# Patient Record
Sex: Male | Born: 1955 | Race: Black or African American | Hispanic: No | Marital: Married | Smoking: Never smoker
Health system: Southern US, Community
[De-identification: ages and names within clinical notes are randomized; demographics above are authoritative.]

## PROBLEM LIST (undated history)

## (undated) DIAGNOSIS — E785 Hyperlipidemia, unspecified: Secondary | ICD-10-CM

## (undated) DIAGNOSIS — E119 Type 2 diabetes mellitus without complications: Secondary | ICD-10-CM

## (undated) DIAGNOSIS — I10 Essential (primary) hypertension: Secondary | ICD-10-CM

## (undated) HISTORY — DX: Essential (primary) hypertension: I10

## (undated) HISTORY — PX: ANKLE SURGERY: SHX546

## (undated) HISTORY — DX: Hyperlipidemia, unspecified: E78.5

## (undated) HISTORY — PX: OTHER SURGICAL HISTORY: SHX169

---

## 2000-07-07 ENCOUNTER — Encounter: Payer: Self-pay | Admitting: Emergency Medicine

## 2000-07-07 ENCOUNTER — Emergency Department (HOSPITAL_COMMUNITY): Admission: EM | Admit: 2000-07-07 | Discharge: 2000-07-08 | Payer: Self-pay | Admitting: Emergency Medicine

## 2006-03-11 ENCOUNTER — Emergency Department (HOSPITAL_COMMUNITY): Admission: EM | Admit: 2006-03-11 | Discharge: 2006-03-11 | Payer: Self-pay | Admitting: Emergency Medicine

## 2008-01-22 ENCOUNTER — Emergency Department (HOSPITAL_COMMUNITY): Admission: EM | Admit: 2008-01-22 | Discharge: 2008-01-22 | Payer: Self-pay | Admitting: Emergency Medicine

## 2009-06-07 ENCOUNTER — Encounter: Payer: Self-pay | Admitting: Orthopedic Surgery

## 2009-06-14 ENCOUNTER — Ambulatory Visit: Payer: Self-pay | Admitting: Orthopedic Surgery

## 2009-06-14 DIAGNOSIS — M1710 Unilateral primary osteoarthritis, unspecified knee: Secondary | ICD-10-CM

## 2009-06-14 DIAGNOSIS — M25569 Pain in unspecified knee: Secondary | ICD-10-CM

## 2009-09-12 ENCOUNTER — Ambulatory Visit: Payer: Self-pay | Admitting: Orthopedic Surgery

## 2009-09-12 DIAGNOSIS — M6580 Other synovitis and tenosynovitis, unspecified site: Secondary | ICD-10-CM

## 2010-04-24 ENCOUNTER — Ambulatory Visit: Payer: Self-pay | Admitting: Orthopedic Surgery

## 2011-01-29 NOTE — Letter (Signed)
Summary: History form  History form   Imported By: Jacklynn Ganong 07/06/2009 16:18:39  _____________________________________________________________________  External Attachment:    Type:   Image     Comment:   External Document

## 2011-01-29 NOTE — Assessment & Plan Note (Signed)
Summary: 3 m RE-CK LT KNEE/RESP TO MED/BCBS/CAF   Visit Type:  Follow-up Referring Provider:  Dr Margo Common  Primary Provider:  Dr. Margo Common   CC:  recheck left knee.  History of Present Illness: I saw Calvin Shields in the office today for a followup visit.  He is a 55 years old man with the complaint of:  left knee pain follow up   recheck left knee after taking Relafen 750 two times a day for left knee tendinitis.  The knee is pain free  ROS no mechanical symptoms  Normal gait, ROM, Strength, stability and alignment, neuro-vascular intact   Stable   Relafen   f/u as needed       Allergies: No Known Drug Allergies   Impression & Recommendations:  Problem # 1:  TENDINITIS, LEFT KNEE (ICD-727.09) Assessment Improved  Orders: Est. Patient Level III ([address])  Problem # 2:  KNEE, ARTHRITIS, DEGEN./OSTEO (UEA-540.98) Assessment: Improved  Orders: Est. Patient Level III (11914)  Patient Instructions: 1)  Please schedule a follow-up appointment as needed. Prescriptions: NABUMETONE 750 MG TABS (NABUMETONE) 1 by mouth two times a day  #60 x 2   Entered and Authorized by:   Fuller Canada MD   Signed by:   Fuller Canada MD on 04/24/2010   Method used:   Printed then faxed to ...       [facility]* (retail)       [address]       [place], [place]  [address]       Ph: [phone]       Fax: [phone]   RxID:   [id number]

## 2011-09-20 LAB — BASIC METABOLIC PANEL
BUN: 14
CO2: 29
Calcium: 9.2
Chloride: 98
Creatinine, Ser: 1.09
GFR calc Af Amer: >60
GFR calc non Af Amer: >60
Glucose, Bld: 204 — ABNORMAL HIGH
Potassium: 3.5
Sodium: 137

## 2011-09-20 LAB — CBC
HCT: 45.1
Hemoglobin: 15.4
MCHC: 34.2
MCV: 87.8
Platelets: 346
RBC: 5.14
RDW: 12.8
WBC: 11.3 — ABNORMAL HIGH

## 2011-09-20 LAB — DIFFERENTIAL
Basophils Absolute: 0.1
Basophils Relative: 1
Eosinophils Absolute: 0.1
Eosinophils Relative: 1
Lymphocytes Relative: 10 — ABNORMAL LOW
Lymphs Abs: 1.1
Monocytes Absolute: 0.6
Monocytes Relative: 5
Neutro Abs: 9.4 — ABNORMAL HIGH
Neutrophils Relative %: 84 — ABNORMAL HIGH

## 2012-08-08 ENCOUNTER — Other Ambulatory Visit: Payer: Self-pay | Admitting: Family Medicine

## 2012-08-08 NOTE — Telephone Encounter (Signed)
Deny--last ov one year ago

## 2012-08-12 ENCOUNTER — Other Ambulatory Visit: Payer: Self-pay | Admitting: Family Medicine

## 2012-08-13 NOTE — Telephone Encounter (Signed)
Needs office visit.

## 2014-01-21 DIAGNOSIS — M76829 Posterior tibial tendinitis, unspecified leg: Secondary | ICD-10-CM | POA: Insufficient documentation

## 2014-01-21 DIAGNOSIS — Z981 Arthrodesis status: Secondary | ICD-10-CM | POA: Insufficient documentation

## 2014-01-21 DIAGNOSIS — I10 Essential (primary) hypertension: Secondary | ICD-10-CM | POA: Insufficient documentation

## 2014-01-25 DIAGNOSIS — IMO0002 Reserved for concepts with insufficient information to code with codable children: Secondary | ICD-10-CM | POA: Insufficient documentation

## 2014-01-25 DIAGNOSIS — E119 Type 2 diabetes mellitus without complications: Secondary | ICD-10-CM | POA: Insufficient documentation

## 2014-01-26 DIAGNOSIS — J189 Pneumonia, unspecified organism: Secondary | ICD-10-CM | POA: Insufficient documentation

## 2014-01-29 DIAGNOSIS — D500 Iron deficiency anemia secondary to blood loss (chronic): Secondary | ICD-10-CM | POA: Insufficient documentation

## 2014-01-29 DIAGNOSIS — K5790 Diverticulosis of intestine, part unspecified, without perforation or abscess without bleeding: Secondary | ICD-10-CM | POA: Insufficient documentation

## 2014-01-29 DIAGNOSIS — K760 Fatty (change of) liver, not elsewhere classified: Secondary | ICD-10-CM | POA: Insufficient documentation

## 2014-06-27 ENCOUNTER — Emergency Department (HOSPITAL_COMMUNITY)
Admission: EM | Admit: 2014-06-27 | Discharge: 2014-06-27 | Disposition: A | Discharge status: Home / Self Care | Payer: BC Managed Care – PPO | Attending: Emergency Medicine | Admitting: Emergency Medicine

## 2014-06-27 ENCOUNTER — Encounter (HOSPITAL_COMMUNITY): Payer: Self-pay | Admitting: Emergency Medicine

## 2014-06-27 DIAGNOSIS — M25571 Pain in right ankle and joints of right foot: Secondary | ICD-10-CM

## 2014-06-27 DIAGNOSIS — Z79899 Other long term (current) drug therapy: Secondary | ICD-10-CM | POA: Insufficient documentation

## 2014-06-27 DIAGNOSIS — G8911 Acute pain due to trauma: Secondary | ICD-10-CM | POA: Insufficient documentation

## 2014-06-27 DIAGNOSIS — Z792 Long term (current) use of antibiotics: Secondary | ICD-10-CM | POA: Insufficient documentation

## 2014-06-27 DIAGNOSIS — M25579 Pain in unspecified ankle and joints of unspecified foot: Secondary | ICD-10-CM | POA: Insufficient documentation

## 2014-06-27 HISTORY — DX: Type 2 diabetes mellitus without complications: E11.9

## 2014-06-27 MED ORDER — HYDROCODONE-ACETAMINOPHEN 5-325 MG PO TABS: 1.0000 | ORAL_TABLET | ORAL | Status: DC | PRN

## 2014-06-27 MED ORDER — HYDROCODONE-ACETAMINOPHEN 5-325 MG PO TABS
2.0000 | ORAL_TABLET | Freq: Once | ORAL | Status: AC
  Administered 2014-06-27: 2 via ORAL
  Filled 2014-06-27: qty 2

## 2014-06-27 MED ORDER — ONDANSETRON HCL 4 MG PO TABS
4.0000 mg | ORAL_TABLET | Freq: Once | ORAL | Status: AC
  Administered 2014-06-27: 4 mg via ORAL
  Filled 2014-06-27: qty 1

## 2014-06-27 MED ORDER — METHYLPREDNISOLONE SODIUM SUCC 125 MG IJ SOLR
125.0000 mg | Freq: Once | INTRAMUSCULAR | Status: AC
  Administered 2014-06-27: 125 mg via INTRAMUSCULAR
  Filled 2014-06-27: qty 2

## 2014-06-27 NOTE — Discharge Instructions (Signed)
Please apply heating pad to your ankle 10-15 minute intervals. Please keep your ankle elevated as much as possible. Use Tylenol for mild pain, use Norco for more severe pain.Norco may cause drowsiness, use with caution.Please see Dr. Margo Commonapper this week for assistance with your pain management. Your were treated with a shot of solumedrol today.  There is no evidence for major infection or dislocation of your ankle at this time.

## 2014-06-27 NOTE — ED Notes (Signed)
Pt with right ankle pain, denies any injury, hx of surgery to that ankle and has swelling noted but pt states that is not new

## 2014-06-27 NOTE — ED Notes (Signed)
Pt verbalized understanding of no driving and to use caution with taking Vicodin and also to be aware of taking tylenol with it and not to take more than 4000mg  within 24 hour period

## 2014-06-27 NOTE — ED Notes (Signed)
Pt reports to the ED with right leg pain. Pt reports surgery to that leg in January, with a new onset of pain yesterday. Pt denies injury or fall.

## 2014-06-27 NOTE — ED Provider Notes (Signed)
CSN: [id number]     Arrival date & time 06/27/14  1003 History  This chart was scribed for non-physician practitioner Ivery QualeHobson Bryant, PA-C working with Vanetta MuldersScott Zackowski, MD by Calvin Shields, ED scribe. This patient was seen in room APFT21/APFT21 and the patient's care was started at 11:32 AM.   Chief Complaint  Patient presents with  . Leg Pain     (Consider location/radiation/quality/duration/timing/severity/associated sxs/prior Treatment) Patient is a 58 y.o. male presenting with leg pain. The history is provided by the patient. No language interpreter was used.  Leg Pain Location:  Leg Time since incident:  1 day Injury: no   Leg location:  R leg Pain details:    Quality:  Aching   Severity:  Moderate   Onset quality:  Sudden   Duration:  1 day   Timing:  Constant Chronicity: right arch reconstruction 12/2013. Associated symptoms: no decreased ROM and no numbness    HPI Comments: Calvin Shields is a 58 y.o. male who presents to the Emergency Department complaining of sudden onset of aching pain to his right foot, onset yesterday. He states he recently had arch reconstructive surgery done on his right foot 12/2013, done by Dr. Scotty CourtStafford in [place]. He states he had 2-3 screws and plate placed during surgery. He denies any recent falls, injuries to his right foot. He denies any other symptoms. He reports a h/o DM, states his sugar level was 102 this morning.   PCP - Louie BostonAPPER,DAVID B, MD - [place].   Past Medical History  Diagnosis Date  . Diabetes mellitus without complication    Past Surgical History  Procedure Laterality Date  . Right leg surgery      No family history on file. History  Substance Use Topics  . Smoking status: Never Smoker   . Smokeless tobacco: Not on file  . Alcohol Use: No    Review of Systems  All other systems reviewed and are negative.  Allergies  Codeine  Home Medications   Prior to Admission medications   Medication Sig Start Date End Date  Taking? Authorizing Provider  Empagliflozin (JARDIANCE) 25 MG TABS Take 25 mg by mouth daily.   Yes Historical Provider, MD  lisinopril-hydrochlorothiazide (PRINZIDE,ZESTORETIC) 20-25 MG per tablet Take 1 tablet by mouth daily.   Yes Historical Provider, MD  pravastatin (PRAVACHOL) 40 MG tablet Take 40 mg by mouth daily.   Yes Historical Provider, MD  cephALEXin (KEFLEX) 500 MG capsule Take 500 mg by mouth 4 (four) times daily.    Historical Provider, MD   BP 160/79  Pulse 72  Temp(Src) 97.9 F (36.6 C) (Oral)  Resp 20  Ht 5\' 9"  (1.753 m)  Wt 244 lb (110.678 kg)  BMI 36.02 kg/m2  SpO2 97% Physical Exam  Nursing note and vitals reviewed. Constitutional: He is oriented to person, place, and time. He appears well-developed and well-nourished. No distress.  HENT:  Head: Normocephalic and atraumatic.  Eyes: Conjunctivae and EOM are normal.  Neck: Neck supple.  Cardiovascular: Normal rate, regular rhythm, normal heart sounds and intact distal pulses.   No murmur heard. DP 1+ right foot. Cap refill < 2 seconds.  Pulmonary/Chest: Effort normal and breath sounds normal. No respiratory distress. He has no wheezes. He has no rales.  Musculoskeletal: Normal range of motion.       Right ankle: Achilles tendon normal.  Right achilles tendon intact.  Neurological: He is alert and oriented to person, place, and time.  Skin: Skin is warm and dry. No  erythema.  Well healed surgical scars on right ankle lateral and medial malleolus. No puncture wound to plantar surface. No lesions between toes. Minimal warmth of lateral malleolus. No red streaking of LE. Decreased hair growth of lower extremity.  Psychiatric: He has a normal mood and affect. His behavior is normal.    ED Course  Procedures (including critical care time)  DIAGNOSTIC STUDIES: Oxygen Saturation is 97% on room air, normal by my interpretation.    COORDINATION OF CARE: 11:39 AM-Discussed treatment plan which includes Cortisone  injection with pt at bedside and pt agreed to plan. Advised pt to inform PCP of his Solumedrol  injection due to his h/o DM.   Medications  methylPREDNISolone sodium succinate (SOLU-MEDROL) 125 mg/2 mL injection 125 mg (125 mg Intramuscular Given 06/27/14 1152)  HYDROcodone-acetaminophen (NORCO/VICODIN) 5-325 MG per tablet 2 tablet (2 tablets Oral Given 06/27/14 1152)  ondansetron (ZOFRAN) tablet 4 mg (4 mg Oral Given 06/27/14 1152)     EKG Interpretation None      MDM No gross neurologic deficits appreciated on today's examination. There are well-healed surgical scars from previous surgical interventions noted. I suspect the patient has an inflammatory attack related to previous operations and especially with the insertion of hardware in the foot.  The patient will be treated with Norco 5 mg #12 tablets. I have asked patient to see his primary physician for additional assistance with pain management.    Final diagnoses:  None      **I personally performed the services described in this documentation, which was scribed in my presence. The recorded information has been reviewed and is accurate.[name]*   Hobson M Bryant, PA-C 06/27/14 1843

## 2014-06-28 NOTE — ED Provider Notes (Signed)
Medical screening examination/treatment/procedure(s) were performed by non-physician practitioner and as supervising physician I was immediately available for consultation/collaboration.   EKG Interpretation None        Scott Zackowski, MD 06/28/14 0824 

## 2015-03-22 ENCOUNTER — Encounter: Payer: Self-pay | Attending: "Endocrinology | Admitting: Nutrition

## 2015-03-22 ENCOUNTER — Encounter: Payer: Self-pay | Admitting: Nutrition

## 2015-03-22 VITALS — Ht 69.0 in | Wt 231.0 lb

## 2015-03-22 DIAGNOSIS — E669 Obesity, unspecified: Secondary | ICD-10-CM

## 2015-03-22 DIAGNOSIS — IMO0002 Reserved for concepts with insufficient information to code with codable children: Secondary | ICD-10-CM

## 2015-03-22 DIAGNOSIS — E1165 Type 2 diabetes mellitus with hyperglycemia: Secondary | ICD-10-CM

## 2015-03-22 DIAGNOSIS — E118 Type 2 diabetes mellitus with unspecified complications: Secondary | ICD-10-CM

## 2015-03-22 NOTE — Progress Notes (Signed)
  Medical Nutrition Therapy:  Appt start time: 1530 end time:  1630.  Assessment:  Primary concerns today: Diabets. Lives with his wife who does all the shopping and cooking. She is here today with him for his appointment.Most foods are baked and sometimes fried. Eats three meals per day. Works at [employer].  Taking NigerJardiance and VIctoza. Physical activity Three times per [facility] doing water aerobics. Not testing blood sugars due to A1C 6.5%.     Would like to lose 50 lbs in 6 months.  Preferred Learning Style:   Auditory  Visual  Hands on  Learning Readiness:     Ready  Change in progress   MEDICATIONS: See list   DIETARY INTAKE:  24-hr recall:  B ( AM): Oatmeal brown sugar and cinnamon oatmeal with coffee at [employer],  Snk ( AM): none  L ( PM): Side salad, no dressing: Lite [place], water Snk ( PM):  D ( PM): Peas, pork chop, roll, water Snk ( PM):  Beverages: water  Usual physical activity: water aerobics three times per week.  Estimated energy needs: 1800 calories 200 g carbohydrates 135 g protein 50 g fat  Progress Towards Goal(s):  In progress.   Nutritional Diagnosis:  NB-1.1 Food and nutrition-related knowledge deficit As related to Diabetes.  As evidenced by A1C 6.6%..    Intervention:  Nutrition counseling on diabetes, meal planning, My Plate, low sodium, low fat high fiber diet,  target ranges for blood sugars, portion sizes, meal planning and benefits of exercise and prevention of complications of DM.  Goal:  1. Lose  3.5 lbs in 1 month 2. Increase fresh fruits and vegetables. 3. Control carb choices per meal 4. Exercise 30 minutes daily.   Teaching Method Utilized:  Visual Auditory Hands on  Handouts given during visit include: The Plate Method Meal Plan Card   Barriers to learning/adherence to lifestyle change: difficulty walking and possible cognitive issues  Demonstrated degree of understanding via:  Teach Back    Monitoring/Evaluation:  Dietary intake, exercise, meal planning, and body weight in 1 month(s).

## 2015-03-22 NOTE — Patient Instructions (Signed)
Goal:  1. Lose  3.5 lbs in 1 month 2. Increase fresh fruits and vegetables. 3. Control carb choices per meal 4. Exercise 30 minutes daily.

## 2015-05-10 ENCOUNTER — Encounter: Payer: Self-pay | Attending: "Endocrinology | Admitting: Nutrition

## 2015-11-16 ENCOUNTER — Telehealth: Payer: Self-pay

## 2015-11-16 NOTE — Telephone Encounter (Signed)
Insurance approved Victoza which he was previously on before Tanzeum. Can we send a new prescription?

## 2015-11-16 NOTE — Telephone Encounter (Signed)
[facility] for both Tanzeum and Victoza in attempt to get insurance to pay for one of these meds. Pt ran out on Thurs 11-09-15, called the pharmacy on 11-09-15 for a refill, and I sent the PA on Thurs 11-09-15. Pt has called about this 4-5 times daily. I have advised him we are waiting on his insurance to respond. I last spoke with [employer] on 11-15-15 concerning this and they state it could take up to 24 more hours. Pt is not happy with this. States "You will give me an answer today." I advised pt to call and see what they will cover so we can prescribe something different and get it today. Pt refuses. I advised him we had one victoza pen available for him but he does need to find out what will be covered.

## 2015-11-16 NOTE — Telephone Encounter (Signed)
After several attempts to authorize either Tanzeum or Victoza. The pts insurance company has not approved either. I have asked the pt to find out what his insurance covers. He refuses. He left a very loud/demanding voice mail today stating that"We BETTER have an answer today". Unsure of what to do at this point?

## 2015-12-19 ENCOUNTER — Ambulatory Visit: Payer: Self-pay | Admitting: "Endocrinology

## 2016-01-04 LAB — HEMOGLOBIN A1C: Hemoglobin A1C: 6.6

## 2016-01-16 ENCOUNTER — Encounter: Payer: Self-pay | Admitting: "Endocrinology

## 2016-01-16 ENCOUNTER — Ambulatory Visit (INDEPENDENT_AMBULATORY_CARE_PROVIDER_SITE_OTHER): Payer: BLUE CROSS/BLUE SHIELD | Admitting: "Endocrinology

## 2016-01-16 VITALS — Ht 69.0 in

## 2016-01-16 DIAGNOSIS — E1165 Type 2 diabetes mellitus with hyperglycemia: Secondary | ICD-10-CM | POA: Insufficient documentation

## 2016-01-16 DIAGNOSIS — E119 Type 2 diabetes mellitus without complications: Secondary | ICD-10-CM

## 2016-01-16 DIAGNOSIS — IMO0002 Reserved for concepts with insufficient information to code with codable children: Secondary | ICD-10-CM | POA: Insufficient documentation

## 2016-01-16 DIAGNOSIS — E782 Mixed hyperlipidemia: Secondary | ICD-10-CM | POA: Insufficient documentation

## 2016-01-16 DIAGNOSIS — I10 Essential (primary) hypertension: Secondary | ICD-10-CM | POA: Insufficient documentation

## 2016-01-16 DIAGNOSIS — E785 Hyperlipidemia, unspecified: Secondary | ICD-10-CM

## 2016-01-16 NOTE — Telephone Encounter (Signed)
Pt discussed with Dr Fransico Him at [facility] today.

## 2016-01-16 NOTE — Progress Notes (Signed)
Subjective:    Patient ID: Calvin Shields, male    DOB: 1956/03/29, PCP Deloria Lair, MD   Past Medical History  Diagnosis Date  . Diabetes mellitus without complication ([place])   . Hyperlipidemia   . Hypertension    Past Surgical History  Procedure Laterality Date  . Right leg surgery      Social History   Social History  . Marital Status: Married    Spouse Name: N/A  . Number of Children: N/A  . Years of Education: N/A   Social History Main Topics  . Smoking status: Never Smoker   . Smokeless tobacco: None  . Alcohol Use: No  . Drug Use: No  . Sexual Activity: Not Asked   Other Topics Concern  . None   Social History Narrative   Outpatient Encounter Prescriptions as of 01/16/2016  Medication Sig  . allopurinol (ZYLOPRIM) 100 MG tablet Take 100 mg by mouth daily.  . Empagliflozin (JARDIANCE) 25 MG TABS Take 25 mg by mouth daily.  Marland Kitchen ibuprofen (ADVIL,MOTRIN) 800 MG tablet Take 800 mg by mouth every 8 (eight) hours as needed.  . Liraglutide 18 MG/3ML SOPN Inject 1.8 mg into the skin daily.   Marland Kitchen lisinopril-hydrochlorothiazide (PRINZIDE,ZESTORETIC) 20-25 MG per tablet Take 1 tablet by mouth daily.  . pravastatin (PRAVACHOL) 40 MG tablet Take 40 mg by mouth daily.  . traMADol-acetaminophen (ULTRACET) 37.5-325 MG tablet Take 1 tablet by mouth every 6 (six) hours as needed.  . [DISCONTINUED] cephALEXin (KEFLEX) 500 MG capsule Take 500 mg by mouth 4 (four) times daily.  . [DISCONTINUED] HYDROcodone-acetaminophen (NORCO/VICODIN) 5-325 MG per tablet Take 1 tablet by mouth every 4 (four) hours as needed. (Patient not taking: Reported on 03/22/2015)   No facility-administered encounter medications on file as of 01/16/2016.   ALLERGIES: Allergies  Allergen Reactions  . Codeine Nausea And Vomiting   VACCINATION STATUS:  There is no immunization history on file for this patient.  Diabetes He presents for his follow-up diabetic visit. He has type 2 diabetes mellitus. His  disease course has been stable. There are no hypoglycemic associated symptoms. Pertinent negatives for hypoglycemia include no confusion, headaches, pallor or seizures. There are no diabetic associated symptoms. Pertinent negatives for diabetes include no chest pain, no fatigue, no polydipsia, no polyphagia, no polyuria and no weakness. There are no hypoglycemic complications. Symptoms are stable. Diabetic complications include peripheral neuropathy. Risk factors for coronary artery disease include diabetes mellitus, dyslipidemia, hypertension, male sex, obesity and sedentary lifestyle. He is compliant with treatment most of the time. His weight is stable. He is following a generally unhealthy diet. When asked about meal planning, he reported none. He has had a previous visit with a dietitian. He never participates in exercise. An ACE inhibitor/angiotensin II receptor blocker is being taken. Eye exam is current.  Hyperlipidemia This is a chronic problem. The current episode started more than 1 year ago. Exacerbating diseases include diabetes and obesity. Pertinent negatives include no chest pain, myalgias or shortness of breath. Current antihyperlipidemic treatment includes statins. Risk factors for coronary artery disease include diabetes mellitus, dyslipidemia, hypertension, male sex, obesity and a sedentary lifestyle.  Hypertension This is a chronic problem. The current episode started more than 1 year ago. Pertinent negatives include no chest pain, headaches, neck pain, palpitations or shortness of breath. Risk factors for coronary artery disease include dyslipidemia, diabetes mellitus, obesity, male gender and sedentary lifestyle. Past treatments include ACE inhibitors.     Review of Systems  Constitutional:  Negative for fever, chills, fatigue and unexpected weight change.  HENT: Negative for dental problem, mouth sores and trouble swallowing.   Eyes: Negative for visual disturbance.  Respiratory:  Negative for cough, choking, chest tightness, shortness of breath and wheezing.   Cardiovascular: Negative for chest pain, palpitations and leg swelling.  Gastrointestinal: Negative for nausea, vomiting, abdominal pain, diarrhea, constipation and abdominal distention.  Endocrine: Negative for polydipsia, polyphagia and polyuria.  Genitourinary: Negative for dysuria, urgency, hematuria and flank pain.  Musculoskeletal: Negative for myalgias, back pain, gait problem and neck pain.  Skin: Negative for pallor, rash and wound.  Neurological: Negative for seizures, syncope, weakness, numbness and headaches.  Psychiatric/Behavioral: Negative.  Negative for confusion and dysphoric mood.    Objective:    Ht '5\' 9"'$  (1.753 m)  Wt Readings from Last 3 Encounters:  03/22/15 231 lb (104.781 kg)  06/27/14 244 lb (110.678 kg)  06/14/09 240 lb (108.863 kg)    Physical Exam  Constitutional: He is oriented to person, place, and time. He appears well-developed and well-nourished. He is cooperative. No distress.  HENT:  Head: Normocephalic and atraumatic.  Eyes: EOM are normal.  Neck: Normal range of motion. Neck supple. No tracheal deviation present. No thyromegaly present.  Cardiovascular: Normal rate, S1 normal, S2 normal and normal heart sounds.  Exam reveals no gallop.   No murmur heard. Pulses:      Dorsalis pedis pulses are 1+ on the right side, and 1+ on the left side.       Posterior tibial pulses are 1+ on the right side, and 1+ on the left side.  Pulmonary/Chest: Breath sounds normal. No respiratory distress. He has no wheezes.  Abdominal: Soft. Bowel sounds are normal. He exhibits no distension. There is no tenderness. There is no guarding and no CVA tenderness.  Musculoskeletal: He exhibits no edema.       Right shoulder: He exhibits no swelling and no deformity.  Neurological: He is alert and oriented to person, place, and time. He has normal strength and normal reflexes. No cranial nerve  deficit or sensory deficit. Gait normal.  Skin: Skin is warm and dry. No rash noted. No cyanosis. Nails show no clubbing.  Psychiatric: He has a normal mood and affect. His speech is normal and behavior is normal. Judgment and thought content normal. Cognition and memory are normal.     CMP ( most recent) CMP     Component Value Date/Time   NA 137 01/22/2008 0649   K 3.5 01/22/2008 0649   CL 98 01/22/2008 0649   CO2 29 01/22/2008 0649   GLUCOSE 204* 01/22/2008 0649   BUN 14 01/22/2008 0649   CREATININE 1.09 01/22/2008 0649   CALCIUM 9.2 01/22/2008 0649   GFRNONAA >60 01/22/2008 0649   GFRAA  01/22/2008 0649    >60        The eGFR has been calculated using the MDRD equation. This calculation has not been validated in all clinical       Assessment & Plan:   1. Diabetes mellitus without complication (HCC)   --diabetes is  complicated by peripheral neuropathy and patient remains at a high risk for more acute and chronic complications of diabetes which include CAD, CVA, CKD, retinopathy, and neuropathy. These are all discussed in detail with the patient.  Patient came with stable A1c of 6.6%.  Recent labs reviewed.   - I have re-counseled the patient on diet management and  and weight loss by adopting a carbohydrate restricted /  protein rich  Diet.  - Suggestion is made for patient to avoid simple carbohydrates   from their diet including Cakes , Desserts, Ice Cream,  Soda (  diet and regular) , Sweet Tea , Candies,  Chips, Cookies, Artificial Sweeteners,   and "Sugar-free" Products .  This will help patient to have stable blood glucose profile and potentially avoid unintended  Weight gain.  - Patient is advised to stick to a routine mealtimes to eat 3 meals  a day and avoid unnecessary snacks ( to snack only to correct hypoglycemia).  - The patient has been  scheduled with Jearld Fenton, RDN, CDE for individualized DM education.  - I have approached patient with the  following individualized plan to manage diabetes and patient agrees.  -  he would not need insulin treatment for now. -I will continue with Victoza 1.8 mg daily and Jardiance 25 mg by mouth daily. -Side effects and precautions discussed with patient.   - Patient specific target  for A1c; LDL, HDL, Triglycerides, and  Waist Circumference were discussed in detail.  2) BP/HTN: controlled. Continue current medications including ACEI/ARB. 3) Lipids/HPL:  continue statins. 4)  Weight/Diet: CDE consult in progress, exercise, and carbohydrates information provided.  5) Chronic Care/Health Maintenance:  -Patient is on ACEI/ARB and Statin medications and encouraged to continue to follow up with Ophthalmology, Podiatrist at least yearly or according to recommendations, and advised to  stay away from smoking. I have recommended yearly flu vaccine and pneumonia vaccination at least every 5 years; moderate intensity exercise for up to 150 minutes weekly; and  sleep for at least 7 hours a day.  - 25 minutes of time was spent on the care of this patient , 50% of which was applied for counseling on diabetes complications and their preventions.  - I advised patient to maintain close follow up with TAPPER,DAVID B, MD for primary care needs.  Patient is asked to bring meter and  blood glucose logs during their next visit.   Follow up plan: -Return in about 3 months (around 04/15/2016) for diabetes, high blood pressure, high cholesterol, follow up with pre-visit labs.  Glade Lloyd, MD Phone: [phone]  Fax: [phone]   01/16/2016, 3:08 PM

## 2016-01-16 NOTE — Patient Instructions (Signed)

## 2016-01-18 ENCOUNTER — Ambulatory Visit: Payer: Self-pay | Admitting: "Endocrinology

## 2016-03-08 ENCOUNTER — Other Ambulatory Visit: Payer: Self-pay | Admitting: "Endocrinology

## 2016-04-15 ENCOUNTER — Other Ambulatory Visit: Payer: Self-pay | Admitting: "Endocrinology

## 2016-04-15 LAB — BASIC METABOLIC PANEL
BUN: 15 mg/dL (ref 7–25)
CALCIUM: 9.8 mg/dL (ref 8.6–10.3)
CO2: 26 mmol/L (ref 20–31)
Chloride: 100 mmol/L (ref 98–110)
Creat: 1.16 mg/dL (ref 0.70–1.25)
Glucose, Bld: 111 mg/dL — ABNORMAL HIGH (ref 65–99)
Potassium: 4.3 mmol/L (ref 3.5–5.3)
SODIUM: 138 mmol/L (ref 135–146)

## 2016-04-15 LAB — HEMOGLOBIN A1C
Hgb A1c MFr Bld: 7 % — ABNORMAL HIGH (ref <5.7)
Mean Plasma Glucose: 154 mg/dL

## 2016-04-15 LAB — LIPID PANEL
CHOL/HDL RATIO: 4.1 ratio (ref <=5.0)
CHOLESTEROL: 192 mg/dL (ref 125–200)
HDL: 47 mg/dL (ref >=40)
LDL Cholesterol: 116 mg/dL (ref <130)
Triglycerides: 147 mg/dL (ref <150)
VLDL: 29 mg/dL (ref <30)

## 2016-04-23 ENCOUNTER — Encounter: Payer: Self-pay | Admitting: "Endocrinology

## 2016-04-23 ENCOUNTER — Ambulatory Visit (INDEPENDENT_AMBULATORY_CARE_PROVIDER_SITE_OTHER): Payer: BLUE CROSS/BLUE SHIELD | Admitting: "Endocrinology

## 2016-04-23 VITALS — BP 157/81 | HR 79 | Ht 69.0 in | Wt 252.0 lb

## 2016-04-23 DIAGNOSIS — Z6839 Body mass index (BMI) 39.0-39.9, adult: Secondary | ICD-10-CM | POA: Insufficient documentation

## 2016-04-23 DIAGNOSIS — E119 Type 2 diabetes mellitus without complications: Secondary | ICD-10-CM

## 2016-04-23 DIAGNOSIS — I10 Essential (primary) hypertension: Secondary | ICD-10-CM | POA: Diagnosis not present

## 2016-04-23 DIAGNOSIS — E785 Hyperlipidemia, unspecified: Secondary | ICD-10-CM | POA: Diagnosis not present

## 2016-04-23 NOTE — Patient Instructions (Signed)

## 2016-04-23 NOTE — Progress Notes (Signed)
Subjective:    Patient ID: Calvin Shields, male    DOB: 1956-06-29, PCP Louie Boston, MD   Past Medical History  Diagnosis Date  . Diabetes mellitus without complication (HCC)   . Hyperlipidemia   . Hypertension    Past Surgical History  Procedure Laterality Date  . Right leg surgery      Social History   Social History  . Marital Status: Married    Spouse Name: N/A  . Number of Children: N/A  . Years of Education: N/A   Social History Main Topics  . Smoking status: Never Smoker   . Smokeless tobacco: None  . Alcohol Use: No  . Drug Use: No  . Sexual Activity: Not Asked   Other Topics Concern  . None   Social History Narrative   Outpatient Encounter Prescriptions as of 04/23/2016  Medication Sig  . allopurinol (ZYLOPRIM) 100 MG tablet Take 100 mg by mouth daily.  Marland Kitchen ibuprofen (ADVIL,MOTRIN) 800 MG tablet Take 800 mg by mouth every 8 (eight) hours as needed.  Marland Kitchen JARDIANCE 25 MG TABS tablet TAKE 1 TABLET BY MOUTH EVERY MORNING.  Marland Kitchen lisinopril-hydrochlorothiazide (PRINZIDE,ZESTORETIC) 20-25 MG per tablet Take 1 tablet by mouth daily.  . pravastatin (PRAVACHOL) 40 MG tablet TAKE (1) TABLET BY MOUTH ONCE DAILY.  . traMADol-acetaminophen (ULTRACET) 37.5-325 MG tablet Take 1 tablet by mouth every 6 (six) hours as needed.  Marland Kitchen VICTOZA 18 MG/3ML SOPN INJECT 1.8 MG SUBQ EVERY DAY.   No facility-administered encounter medications on file as of 04/23/2016.   ALLERGIES: Allergies  Allergen Reactions  . Codeine Nausea And Vomiting   VACCINATION STATUS:  There is no immunization history on file for this patient.  Diabetes He presents for his follow-up diabetic visit. He has type 2 diabetes mellitus. His disease course has been stable. There are no hypoglycemic associated symptoms. Pertinent negatives for hypoglycemia include no confusion, headaches, pallor or seizures. There are no diabetic associated symptoms. Pertinent negatives for diabetes include no chest pain, no  fatigue, no polydipsia, no polyphagia, no polyuria and no weakness. There are no hypoglycemic complications. Symptoms are stable. Diabetic complications include peripheral neuropathy. Risk factors for coronary artery disease include diabetes mellitus, dyslipidemia, hypertension, male sex, obesity and sedentary lifestyle. He is compliant with treatment most of the time. His weight is stable. He is following a generally unhealthy diet. When asked about meal planning, he reported none. He has had a previous visit with a dietitian. He never participates in exercise. An ACE inhibitor/angiotensin II receptor blocker is being taken. Eye exam is current.  Hyperlipidemia This is a chronic problem. The current episode started more than 1 year ago. Exacerbating diseases include diabetes and obesity. Pertinent negatives include no chest pain, myalgias or shortness of breath. Current antihyperlipidemic treatment includes statins. Risk factors for coronary artery disease include diabetes mellitus, dyslipidemia, hypertension, male sex, obesity and a sedentary lifestyle.  Hypertension This is a chronic problem. The current episode started more than 1 year ago. Pertinent negatives include no chest pain, headaches, neck pain, palpitations or shortness of breath. Risk factors for coronary artery disease include dyslipidemia, diabetes mellitus, obesity, male gender and sedentary lifestyle. Past treatments include ACE inhibitors.     Review of Systems  Constitutional: Negative for fever, chills, fatigue and unexpected weight change.  HENT: Negative for dental problem, mouth sores and trouble swallowing.   Eyes: Negative for visual disturbance.  Respiratory: Negative for cough, choking, chest tightness, shortness of breath and wheezing.   Cardiovascular:  Negative for chest pain, palpitations and leg swelling.  Gastrointestinal: Negative for nausea, vomiting, abdominal pain, diarrhea, constipation and abdominal distention.   Endocrine: Negative for polydipsia, polyphagia and polyuria.  Genitourinary: Negative for dysuria, urgency, hematuria and flank pain.  Musculoskeletal: Negative for myalgias, back pain, gait problem and neck pain.  Skin: Negative for pallor, rash and wound.  Neurological: Negative for seizures, syncope, weakness, numbness and headaches.  Psychiatric/Behavioral: Negative.  Negative for confusion and dysphoric mood.    Objective:    BP 157/81 mmHg  Pulse 79  Ht 5\' 9"  (1.753 m)  Wt 252 lb (114.306 kg)  BMI 37.20 kg/m2  SpO2 95%  Wt Readings from Last 3 Encounters:  04/23/16 252 lb (114.306 kg)  03/22/15 231 lb (104.781 kg)  06/27/14 244 lb (110.678 kg)    Physical Exam  Constitutional: He is oriented to person, place, and time. He appears well-developed and well-nourished. He is cooperative. No distress.  HENT:  Head: Normocephalic and atraumatic.  Eyes: EOM are normal.  Neck: Normal range of motion. Neck supple. No tracheal deviation present. No thyromegaly present.  Cardiovascular: Normal rate, S1 normal, S2 normal and normal heart sounds.  Exam reveals no gallop.   No murmur heard. Pulses:      Dorsalis pedis pulses are 1+ on the right side, and 1+ on the left side.       Posterior tibial pulses are 1+ on the right side, and 1+ on the left side.  Pulmonary/Chest: Breath sounds normal. No respiratory distress. He has no wheezes.  Abdominal: Soft. Bowel sounds are normal. He exhibits no distension. There is no tenderness. There is no guarding and no CVA tenderness.  Musculoskeletal: He exhibits no edema.       Right shoulder: He exhibits no swelling and no deformity.  Neurological: He is alert and oriented to person, place, and time. He has normal strength and normal reflexes. No cranial nerve deficit or sensory deficit. Gait normal.  Skin: Skin is warm and dry. No rash noted. No cyanosis. Nails show no clubbing.  Psychiatric: He has a normal mood and affect. His speech is  normal and behavior is normal. Judgment and thought content normal. Cognition and memory are normal.   Recent Results (from the past 2160 hour(s))  Basic metabolic panel     Status: Abnormal   Collection Time: 04/15/16  8:36 AM  Result Value Ref Range   Sodium 138 135 - 146 mmol/L   Potassium 4.3 3.5 - 5.3 mmol/L   Chloride 100 98 - 110 mmol/L   CO2 26 20 - 31 mmol/L   Glucose, Bld 111 (H) 65 - 99 mg/dL   BUN 15 7 - 25 mg/dL   Creat 8.11 9.14 - 7.82 mg/dL   Calcium 9.8 8.6 - 95.6 mg/dL  Lipid panel     Status: None   Collection Time: 04/15/16  8:36 AM  Result Value Ref Range   Cholesterol 192 125 - 200 mg/dL   Triglycerides 213 <086 mg/dL   HDL 47 >=57 mg/dL   Total CHOL/HDL Ratio 4.1 <=5.0 Ratio   VLDL 29 <30 mg/dL   LDL Cholesterol 846 <962 mg/dL    Comment:   Total Cholesterol/HDL Ratio:CHD Risk                        Coronary Heart Disease Risk Table  Men       Women          1/2 Average Risk              3.4        3.3              Average Risk              5.0        4.4           2X Average Risk              9.6        7.1           3X Average Risk             23.4       11.0 Use the calculated Patient Ratio above and the CHD Risk table  to determine the patient's CHD Risk.   Hemoglobin A1c     Status: Abnormal   Collection Time: 04/15/16  8:36 AM  Result Value Ref Range   Hgb A1c MFr Bld 7.0 (H) <5.7 %    Comment:   For someone without known diabetes, a hemoglobin A1c value of 6.5% or greater indicates that they may have diabetes and this should be confirmed with a follow-up test.   For someone with known diabetes, a value <7% indicates that their diabetes is well controlled and a value greater than or equal to 7% indicates suboptimal control. A1c targets should be individualized based on duration of diabetes, age, comorbid conditions, and other considerations.   Currently, no consensus exists for use of hemoglobin A1c for  diagnosis of diabetes for children.      Mean Plasma Glucose 154 mg/dL     Assessment & Plan:   1. Diabetes mellitus without complication (HCC)   --diabetes is  complicated by peripheral neuropathy and patient remains at a high risk for more acute and chronic complications of diabetes which include CAD, CVA, CKD, retinopathy, and neuropathy. These are all discussed in detail with the patient.  Patient came with stable A1c of 7%.  Recent labs reviewed.   - I have re-counseled the patient on diet management and  and weight loss by adopting a carbohydrate restricted / protein rich  Diet.  - Suggestion is made for patient to avoid simple carbohydrates   from their diet including Cakes , Desserts, Ice Cream,  Soda (  diet and regular) , Sweet Tea , Candies,  Chips, Cookies, Artificial Sweeteners,   and "Sugar-free" Products .  This will help patient to have stable blood glucose profile and potentially avoid unintended  Weight gain.  - Patient is advised to stick to a routine mealtimes to eat 3 meals  a day and avoid unnecessary snacks ( to snack only to correct hypoglycemia).  - The patient has been  scheduled with Norm SaltPenny Crumpton, RDN, CDE for individualized DM education.  - I have approached patient with the following individualized plan to manage diabetes and patient agrees.  -  he will not need insulin treatment for now. -I will continue with Victoza 1.8 mg daily and Jardiance 25 mg by mouth daily. -Side effects and precautions discussed with patient.   - Patient specific target  for A1c; LDL, HDL, Triglycerides, and  Waist Circumference were discussed in detail.  2) BP/HTN: controlled. Continue current medications including ACEI/ARB. 3) Lipids/HPL:  continue statins. 4)  Weight/Diet: CDE consult in progress, exercise,  and carbohydrates information provided.  5) Chronic Care/Health Maintenance:  -Patient is on ACEI/ARB and Statin medications and encouraged to continue to follow  up with Ophthalmology, Podiatrist at least yearly or according to recommendations, and advised to  stay away from smoking. I have recommended yearly flu vaccine and pneumonia vaccination at least every 5 years; moderate intensity exercise for up to 150 minutes weekly; and  sleep for at least 7 hours a day.  - 25 minutes of time was spent on the care of this patient , 50% of which was applied for counseling on diabetes complications and their preventions.  - I advised patient to maintain close follow up with TAPPER,DAVID B, MD for primary care needs.  Patient is asked to bring meter and  blood glucose logs during their next visit.   Follow up plan: -Return in about 3 months (around 07/23/2016) for diabetes, high blood pressure, high cholesterol, follow up with pre-visit labs.  Marquis Lunch, MD Phone: [phone]  Fax: [phone]   04/23/2016, 10:48 AM

## 2016-07-17 ENCOUNTER — Other Ambulatory Visit: Payer: Self-pay | Admitting: "Endocrinology

## 2016-07-23 ENCOUNTER — Ambulatory Visit: Payer: BLUE CROSS/BLUE SHIELD | Admitting: "Endocrinology

## 2016-08-06 ENCOUNTER — Other Ambulatory Visit: Payer: Self-pay | Admitting: "Endocrinology

## 2016-08-06 LAB — COMPLETE METABOLIC PANEL WITH GFR
ALT: 14 U/L (ref 9–46)
AST: 13 U/L (ref 10–35)
Albumin: 4.1 g/dL (ref 3.6–5.1)
Alkaline Phosphatase: 84 U/L (ref 40–115)
BUN: 14 mg/dL (ref 7–25)
CHLORIDE: 99 mmol/L (ref 98–110)
CO2: 24 mmol/L (ref 20–31)
Calcium: 9.6 mg/dL (ref 8.6–10.3)
Creat: 1.09 mg/dL (ref 0.70–1.25)
GFR, Est African American: 84 mL/min (ref >=60)
GFR, Est Non African American: 73 mL/min (ref >=60)
GLUCOSE: 95 mg/dL (ref 65–99)
POTASSIUM: 4.1 mmol/L (ref 3.5–5.3)
SODIUM: 139 mmol/L (ref 135–146)
Total Bilirubin: 0.6 mg/dL (ref 0.2–1.2)
Total Protein: 7.2 g/dL (ref 6.1–8.1)

## 2016-08-07 LAB — HEMOGLOBIN A1C
Hgb A1c MFr Bld: 6.4 % — ABNORMAL HIGH (ref <5.7)
Mean Plasma Glucose: 137 mg/dL

## 2016-08-27 ENCOUNTER — Encounter: Payer: Self-pay | Admitting: "Endocrinology

## 2016-08-27 ENCOUNTER — Ambulatory Visit (INDEPENDENT_AMBULATORY_CARE_PROVIDER_SITE_OTHER): Payer: BLUE CROSS/BLUE SHIELD | Admitting: "Endocrinology

## 2016-08-27 VITALS — BP 143/64 | HR 86 | Ht 69.0 in | Wt 250.0 lb

## 2016-08-27 DIAGNOSIS — E119 Type 2 diabetes mellitus without complications: Secondary | ICD-10-CM

## 2016-08-27 DIAGNOSIS — I10 Essential (primary) hypertension: Secondary | ICD-10-CM

## 2016-08-27 DIAGNOSIS — E785 Hyperlipidemia, unspecified: Secondary | ICD-10-CM | POA: Diagnosis not present

## 2016-08-27 NOTE — Patient Instructions (Signed)

## 2016-08-27 NOTE — Progress Notes (Signed)
Subjective:    Patient ID: Calvin Shields, male    DOB: Apr 01, 1956, PCP Louie Boston, MD   Past Medical History:  Diagnosis Date  . Diabetes mellitus without complication (HCC)   . Hyperlipidemia   . Hypertension    Past Surgical History:  Procedure Laterality Date  . right leg surgery      Social History   Social History  . Marital status: Married    Spouse name: N/A  . Number of children: N/A  . Years of education: N/A   Social History Main Topics  . Smoking status: Never Smoker  . Smokeless tobacco: Never Used  . Alcohol use No  . Drug use: No  . Sexual activity: Not Asked   Other Topics Concern  . None   Social History Narrative  . None   Outpatient Encounter Prescriptions as of 08/27/2016  Medication Sig  . allopurinol (ZYLOPRIM) 100 MG tablet Take 100 mg by mouth daily.  Marland Kitchen aspirin 81 MG chewable tablet Chew by mouth daily.  Marland Kitchen JARDIANCE 25 MG TABS tablet TAKE 1 TABLET BY MOUTH EVERY MORNING.  Marland Kitchen lisinopril-hydrochlorothiazide (PRINZIDE,ZESTORETIC) 20-25 MG per tablet Take 1 tablet by mouth daily.  . naproxen (NAPROSYN) 500 MG tablet Take 500 mg by mouth 2 (two) times daily with a meal.  . pravastatin (PRAVACHOL) 40 MG tablet TAKE (1) TABLET BY MOUTH ONCE DAILY.  Marland Kitchen VICTOZA 18 MG/3ML SOPN INJECT 1.8 MG SUBQ EVERY DAY.  Marland Kitchen ibuprofen (ADVIL,MOTRIN) 800 MG tablet Take 800 mg by mouth every 8 (eight) hours as needed.  . traMADol-acetaminophen (ULTRACET) 37.5-325 MG tablet Take 1 tablet by mouth every 6 (six) hours as needed.   No facility-administered encounter medications on file as of 08/27/2016.    ALLERGIES: Allergies  Allergen Reactions  . Codeine Nausea And Vomiting   VACCINATION STATUS:  There is no immunization history on file for this patient.  Diabetes  He presents for his follow-up diabetic visit. He has type 2 diabetes mellitus. His disease course has been stable. There are no hypoglycemic associated symptoms. Pertinent negatives for  hypoglycemia include no confusion, headaches, pallor or seizures. There are no diabetic associated symptoms. Pertinent negatives for diabetes include no chest pain, no fatigue, no polydipsia, no polyphagia, no polyuria and no weakness. There are no hypoglycemic complications. Symptoms are stable. Diabetic complications include peripheral neuropathy. Risk factors for coronary artery disease include diabetes mellitus, dyslipidemia, hypertension, male sex, obesity and sedentary lifestyle. He is compliant with treatment most of the time. His weight is stable. He is following a generally unhealthy diet. When asked about meal planning, he reported none. He has had a previous visit with a dietitian. He never participates in exercise. An ACE inhibitor/angiotensin II receptor blocker is being taken. Eye exam is current.  Hyperlipidemia  This is a chronic problem. The current episode started more than 1 year ago. Exacerbating diseases include diabetes and obesity. Pertinent negatives include no chest pain, myalgias or shortness of breath. Current antihyperlipidemic treatment includes statins. Risk factors for coronary artery disease include diabetes mellitus, dyslipidemia, hypertension, male sex, obesity and a sedentary lifestyle.  Hypertension  This is a chronic problem. The current episode started more than 1 year ago. Pertinent negatives include no chest pain, headaches, neck pain, palpitations or shortness of breath. Risk factors for coronary artery disease include dyslipidemia, diabetes mellitus, obesity, male gender and sedentary lifestyle. Past treatments include ACE inhibitors.     Review of Systems  Constitutional: Negative for chills, fatigue, fever and  unexpected weight change.  HENT: Negative for dental problem, mouth sores and trouble swallowing.   Eyes: Negative for visual disturbance.  Respiratory: Negative for cough, choking, chest tightness, shortness of breath and wheezing.   Cardiovascular:  Negative for chest pain, palpitations and leg swelling.  Gastrointestinal: Negative for abdominal distention, abdominal pain, constipation, diarrhea, nausea and vomiting.  Endocrine: Negative for polydipsia, polyphagia and polyuria.  Genitourinary: Negative for dysuria, flank pain, hematuria and urgency.  Musculoskeletal: Negative for back pain, gait problem, myalgias and neck pain.  Skin: Negative for pallor, rash and wound.  Neurological: Negative for seizures, syncope, weakness, numbness and headaches.  Psychiatric/Behavioral: Negative.  Negative for confusion and dysphoric mood.    Objective:    BP (!) 143/64   Pulse 86   Ht 5\' 9"  (1.753 m)   Wt 250 lb (113.4 kg)   BMI 36.92 kg/m   Wt Readings from Last 3 Encounters:  08/27/16 250 lb (113.4 kg)  04/23/16 252 lb (114.3 kg)  03/22/15 231 lb (104.8 kg)    Physical Exam  Constitutional: He is oriented to person, place, and time. He appears well-developed and well-nourished. He is cooperative. No distress.  HENT:  Head: Normocephalic and atraumatic.  Eyes: EOM are normal.  Neck: Normal range of motion. Neck supple. No tracheal deviation present. No thyromegaly present.  Cardiovascular: Normal rate, S1 normal, S2 normal and normal heart sounds.  Exam reveals no gallop.   No murmur heard. Pulses:      Dorsalis pedis pulses are 1+ on the right side, and 1+ on the left side.       Posterior tibial pulses are 1+ on the right side, and 1+ on the left side.  Pulmonary/Chest: Breath sounds normal. No respiratory distress. He has no wheezes.  Abdominal: Soft. Bowel sounds are normal. He exhibits no distension. There is no tenderness. There is no guarding and no CVA tenderness.  Musculoskeletal: He exhibits no edema.       Right shoulder: He exhibits no swelling and no deformity.  Neurological: He is alert and oriented to person, place, and time. He has normal strength and normal reflexes. No cranial nerve deficit or sensory deficit. Gait  normal.  Skin: Skin is warm and dry. No rash noted. No cyanosis. Nails show no clubbing.  Psychiatric: He has a normal mood and affect. His speech is normal and behavior is normal. Judgment and thought content normal. Cognition and memory are normal.   Recent Results (from the past 2160 hour(s))  COMPLETE METABOLIC PANEL WITH GFR     Status: None   Collection Time: 08/06/16 10:09 AM  Result Value Ref Range   Sodium 139 135 - 146 mmol/L   Potassium 4.1 3.5 - 5.3 mmol/L   Chloride 99 98 - 110 mmol/L   CO2 24 20 - 31 mmol/L   Glucose, Bld 95 65 - 99 mg/dL   BUN 14 7 - 25 mg/dL   Creat 1.61 0.96 - 0.45 mg/dL    Comment:   For patients > or = 61 years of age: The upper reference limit for Creatinine is approximately 13% higher for people identified as African-American.      Total Bilirubin 0.6 0.2 - 1.2 mg/dL   Alkaline Phosphatase 84 40 - 115 U/L   AST 13 10 - 35 U/L   ALT 14 9 - 46 U/L   Total Protein 7.2 6.1 - 8.1 g/dL   Albumin 4.1 3.6 - 5.1 g/dL   Calcium 9.6 8.6 -  10.3 mg/dL   GFR, Est African American 84 >=60 mL/min   GFR, Est Non African American 73 >=60 mL/min  Hemoglobin A1c     Status: Abnormal   Collection Time: 08/06/16 10:09 AM  Result Value Ref Range   Hgb A1c MFr Bld 6.4 (H) <5.7 %    Comment:   For someone without known diabetes, a hemoglobin A1c value between 5.7% and 6.4% is consistent with prediabetes and should be confirmed with a follow-up test.   For someone with known diabetes, a value <7% indicates that their diabetes is well controlled. A1c targets should be individualized based on duration of diabetes, age, co-morbid conditions and other considerations.   This assay result is consistent with an increased risk of diabetes.   Currently, no consensus exists regarding use of hemoglobin A1c for diagnosis of diabetes in children.      Mean Plasma Glucose 137 mg/dL     Assessment & Plan:   1. Diabetes mellitus without complication  (HCC)   --diabetes is  complicated by peripheral neuropathy and patient remains at a high risk for more acute and chronic complications of diabetes which include CAD, CVA, CKD, retinopathy, and neuropathy. These are all discussed in detail with the patient.  Patient came with stable A1c of  6.4% stable from 7%, last visit. Recent labs reviewed.   - I have re-counseled the patient on diet management and  and weight loss by adopting a carbohydrate restricted / protein rich  Diet.  - Suggestion is made for patient to avoid simple carbohydrates   from their diet including Cakes , Desserts, Ice Cream,  Soda (  diet and regular) , Sweet Tea , Candies,  Chips, Cookies, Artificial Sweeteners,   and "Sugar-free" Products .  This will help patient to have stable blood glucose profile and potentially avoid unintended  Weight gain.  - Patient is advised to stick to a routine mealtimes to eat 3 meals  a day and avoid unnecessary snacks ( to snack only to correct hypoglycemia).  - The patient has been  scheduled with Norm SaltPenny Crumpton, RDN, CDE for individualized DM education.  - I have approached patient with the following individualized plan to manage diabetes and patient agrees.  -  he will not need insulin treatment for now. -I will continue with Victoza 1.8 mg daily and Jardiance 25 mg by mouth daily. -Side effects and precautions discussed with patient.   - Patient specific target  for A1c; LDL, HDL, Triglycerides, and  Waist Circumference were discussed in detail.  2) BP/HTN: controlled. Continue current medications including ACEI/ARB. 3) Lipids/HPL:  continue statins. 4)  Weight/Diet: CDE consult in progress, exercise, and carbohydrates information provided.  5) Chronic Care/Health Maintenance:  -Patient is on ACEI/ARB and Statin medications and encouraged to continue to follow up with Ophthalmology, Podiatrist at least yearly or according to recommendations, and advised to  stay away from  smoking. I have recommended yearly flu vaccine and pneumonia vaccination at least every 5 years; moderate intensity exercise for up to 150 minutes weekly; and  sleep for at least 7 hours a day.  - 25 minutes of time was spent on the care of this patient , 50% of which was applied for counseling on diabetes complications and their preventions.  - I advised patient to maintain close follow up with TAPPER,DAVID B, MD for primary care needs.  Patient is asked to bring meter and  blood glucose logs during their next visit.   Follow up plan: -  Return in about 3 months (around 11/27/2016) for follow up with pre-visit labs.  Marquis Lunch, MD Phone: [phone]  Fax: [phone]   08/27/2016, 11:49 AM

## 2016-09-25 ENCOUNTER — Other Ambulatory Visit: Payer: Self-pay | Admitting: "Endocrinology

## 2016-11-23 ENCOUNTER — Other Ambulatory Visit: Payer: Self-pay | Admitting: "Endocrinology

## 2016-11-27 ENCOUNTER — Other Ambulatory Visit: Payer: Self-pay | Admitting: "Endocrinology

## 2016-11-27 LAB — COMPLETE METABOLIC PANEL WITH GFR
ALK PHOS: 85 U/L (ref 40–115)
ALT: 17 U/L (ref 9–46)
AST: 15 U/L (ref 10–35)
Albumin: 4.2 g/dL (ref 3.6–5.1)
BUN: 17 mg/dL (ref 7–25)
CALCIUM: 9.8 mg/dL (ref 8.6–10.3)
CO2: 26 mmol/L (ref 20–31)
CREATININE: 1.21 mg/dL (ref 0.70–1.25)
Chloride: 100 mmol/L (ref 98–110)
GFR, EST AFRICAN AMERICAN: 74 mL/min (ref >=60)
GFR, Est Non African American: 64 mL/min (ref >=60)
Glucose, Bld: 111 mg/dL — ABNORMAL HIGH (ref 65–99)
Potassium: 3.8 mmol/L (ref 3.5–5.3)
Sodium: 136 mmol/L (ref 135–146)
Total Bilirubin: 0.5 mg/dL (ref 0.2–1.2)
Total Protein: 7.5 g/dL (ref 6.1–8.1)

## 2016-11-27 LAB — HEMOGLOBIN A1C
Hgb A1c MFr Bld: 6.5 % — ABNORMAL HIGH (ref <5.7)
Mean Plasma Glucose: 140 mg/dL

## 2016-11-28 LAB — MICROALBUMIN / CREATININE URINE RATIO
CREATININE, URINE: 149 mg/dL (ref 20–370)
Microalb Creat Ratio: 11 mcg/mg creat (ref <30)
Microalb, Ur: 1.7 mg/dL

## 2016-12-03 ENCOUNTER — Encounter: Payer: Self-pay | Admitting: "Endocrinology

## 2016-12-03 ENCOUNTER — Ambulatory Visit (INDEPENDENT_AMBULATORY_CARE_PROVIDER_SITE_OTHER): Payer: BLUE CROSS/BLUE SHIELD | Admitting: "Endocrinology

## 2016-12-03 VITALS — BP 128/77 | HR 90 | Ht 69.0 in | Wt 256.0 lb

## 2016-12-03 DIAGNOSIS — E1165 Type 2 diabetes mellitus with hyperglycemia: Secondary | ICD-10-CM | POA: Diagnosis not present

## 2016-12-03 DIAGNOSIS — E1169 Type 2 diabetes mellitus with other specified complication: Secondary | ICD-10-CM

## 2016-12-03 DIAGNOSIS — E782 Mixed hyperlipidemia: Secondary | ICD-10-CM

## 2016-12-03 DIAGNOSIS — IMO0002 Reserved for concepts with insufficient information to code with codable children: Secondary | ICD-10-CM

## 2016-12-03 DIAGNOSIS — I10 Essential (primary) hypertension: Secondary | ICD-10-CM | POA: Diagnosis not present

## 2016-12-03 NOTE — Progress Notes (Signed)
Subjective:    Patient ID: Calvin Shields, male    DOB: 1956/01/04, PCP Louie BostonAPPER,DAVID B, MD   Past Medical History:  Diagnosis Date  . Diabetes mellitus without complication (HCC)   . Hyperlipidemia   . Hypertension    Past Surgical History:  Procedure Laterality Date  . right leg surgery      Social History   Social History  . Marital status: Married    Spouse name: N/A  . Number of children: N/A  . Years of education: N/A   Social History Main Topics  . Smoking status: Never Smoker  . Smokeless tobacco: Never Used  . Alcohol use No  . Drug use: No  . Sexual activity: Not Asked   Other Topics Concern  . None   Social History Narrative  . None   Outpatient Encounter Prescriptions as of 12/03/2016  Medication Sig  . allopurinol (ZYLOPRIM) 100 MG tablet Take 100 mg by mouth daily.  Marland Kitchen. aspirin 81 MG chewable tablet Chew by mouth daily.  Marland Kitchen. ibuprofen (ADVIL,MOTRIN) 800 MG tablet Take 800 mg by mouth every 8 (eight) hours as needed.  Marland Kitchen. JARDIANCE 25 MG TABS tablet TAKE 1 TABLET BY MOUTH EVERY MORNING.  Marland Kitchen. lisinopril-hydrochlorothiazide (PRINZIDE,ZESTORETIC) 20-25 MG per tablet Take 1 tablet by mouth daily.  . naproxen (NAPROSYN) 500 MG tablet Take 500 mg by mouth 2 (two) times daily with a meal.  . pravastatin (PRAVACHOL) 40 MG tablet TAKE (1) TABLET BY MOUTH ONCE DAILY.  . traMADol-acetaminophen (ULTRACET) 37.5-325 MG tablet Take 1 tablet by mouth every 6 (six) hours as needed.  Marland Kitchen. VICTOZA 18 MG/3ML SOPN INJECT 1.8 MG SUBQ EVERY DAY.   No facility-administered encounter medications on file as of 12/03/2016.    ALLERGIES: Allergies  Allergen Reactions  . Codeine Nausea And Vomiting   VACCINATION STATUS:  There is no immunization history on file for this patient.  Diabetes  He presents for his follow-up diabetic visit. He has type 2 diabetes mellitus. His disease course has been stable. There are no hypoglycemic associated symptoms. Pertinent negatives for  hypoglycemia include no confusion, headaches, pallor or seizures. There are no diabetic associated symptoms. Pertinent negatives for diabetes include no chest pain, no fatigue, no polydipsia, no polyphagia, no polyuria and no weakness. There are no hypoglycemic complications. Symptoms are stable. Diabetic complications include peripheral neuropathy. Risk factors for coronary artery disease include diabetes mellitus, dyslipidemia, hypertension, male sex, obesity and sedentary lifestyle. He is compliant with treatment most of the time. His weight is increasing steadily. He is following a generally unhealthy diet. When asked about meal planning, he reported none. He has had a previous visit with a dietitian. He never participates in exercise. An ACE inhibitor/angiotensin II receptor blocker is being taken. Eye exam is current.  Hyperlipidemia  This is a chronic problem. The current episode started more than 1 year ago. Exacerbating diseases include diabetes and obesity. Pertinent negatives include no chest pain, myalgias or shortness of breath. Current antihyperlipidemic treatment includes statins. Risk factors for coronary artery disease include diabetes mellitus, dyslipidemia, hypertension, male sex, obesity and a sedentary lifestyle.  Hypertension  This is a chronic problem. The current episode started more than 1 year ago. Pertinent negatives include no chest pain, headaches, neck pain, palpitations or shortness of breath. Risk factors for coronary artery disease include dyslipidemia, diabetes mellitus, obesity, male gender and sedentary lifestyle. Past treatments include ACE inhibitors.     Review of Systems  Constitutional: Negative for chills, fatigue, fever  and unexpected weight change.  HENT: Negative for dental problem, mouth sores and trouble swallowing.   Eyes: Negative for visual disturbance.  Respiratory: Negative for cough, choking, chest tightness, shortness of breath and wheezing.    Cardiovascular: Negative for chest pain, palpitations and leg swelling.  Gastrointestinal: Negative for abdominal distention, abdominal pain, constipation, diarrhea, nausea and vomiting.  Endocrine: Negative for polydipsia, polyphagia and polyuria.  Genitourinary: Negative for dysuria, flank pain, hematuria and urgency.  Musculoskeletal: Negative for back pain, gait problem, myalgias and neck pain.  Skin: Negative for pallor, rash and wound.  Neurological: Negative for seizures, syncope, weakness, numbness and headaches.  Psychiatric/Behavioral: Negative.  Negative for confusion and dysphoric mood.    Objective:    BP 128/77   Pulse 90   Ht 5\' 9"  (1.753 m)   Wt 256 lb (116.1 kg)   BMI 37.80 kg/m   Wt Readings from Last 3 Encounters:  12/03/16 256 lb (116.1 kg)  08/27/16 250 lb (113.4 kg)  04/23/16 252 lb (114.3 kg)    Physical Exam  Constitutional: He is oriented to person, place, and time. He appears well-developed and well-nourished. He is cooperative. No distress.  HENT:  Head: Normocephalic and atraumatic.  Eyes: EOM are normal.  Neck: Normal range of motion. Neck supple. No tracheal deviation present. No thyromegaly present.  Cardiovascular: Normal rate, S1 normal, S2 normal and normal heart sounds.  Exam reveals no gallop.   No murmur heard. Pulses:      Dorsalis pedis pulses are 1+ on the right side, and 1+ on the left side.       Posterior tibial pulses are 1+ on the right side, and 1+ on the left side.  Pulmonary/Chest: Breath sounds normal. No respiratory distress. He has no wheezes.  Abdominal: Soft. Bowel sounds are normal. He exhibits no distension. There is no tenderness. There is no guarding and no CVA tenderness.  Musculoskeletal: He exhibits no edema.       Right shoulder: He exhibits no swelling and no deformity.  Neurological: He is alert and oriented to person, place, and time. He has normal strength and normal reflexes. No cranial nerve deficit or sensory  deficit. Gait normal.  Skin: Skin is warm and dry. No rash noted. No cyanosis. Nails show no clubbing.  Psychiatric: He has a normal mood and affect. His speech is normal and behavior is normal. Judgment and thought content normal. Cognition and memory are normal.   Recent Results (from the past 2160 hour(s))  COMPLETE METABOLIC PANEL WITH GFR     Status: Abnormal   Collection Time: 11/27/16  1:25 PM  Result Value Ref Range   Sodium 136 135 - 146 mmol/L   Potassium 3.8 3.5 - 5.3 mmol/L   Chloride 100 98 - 110 mmol/L   CO2 26 20 - 31 mmol/L   Glucose, Bld 111 (H) 65 - 99 mg/dL   BUN 17 7 - 25 mg/dL   Creat 1.61 0.96 - 0.45 mg/dL    Comment:   For patients > or = 61 years of age: The upper reference limit for Creatinine is approximately 13% higher for people identified as African-American.      Total Bilirubin 0.5 0.2 - 1.2 mg/dL   Alkaline Phosphatase 85 40 - 115 U/L   AST 15 10 - 35 U/L   ALT 17 9 - 46 U/L   Total Protein 7.5 6.1 - 8.1 g/dL   Albumin 4.2 3.6 - 5.1 g/dL   Calcium 9.8  8.6 - 10.3 mg/dL   GFR, Est African American 74 >=60 mL/min   GFR, Est Non African American 64 >=60 mL/min  Microalbumin / creatinine urine ratio     Status: None   Collection Time: 11/27/16  1:25 PM  Result Value Ref Range   Creatinine, Urine 149 20 - 370 mg/dL   Microalb, Ur 1.7 Not estab mg/dL   Microalb Creat Ratio 11 <30 mcg/mg creat    Comment: The ADA has defined abnormalities in albumin excretion as follows:           Category           Result                            (mcg/mg creatinine)                 Normal:    <30       Microalbuminuria:    30 - 299   Clinical albuminuria:    > or = 300   The ADA recommends that at least two of three specimens collected within a 3 - 6 month period be abnormal before considering a patient to be within a diagnostic category.     Hemoglobin A1c     Status: Abnormal   Collection Time: 11/27/16  1:25 PM  Result Value Ref Range   Hgb A1c MFr Bld  6.5 (H) <5.7 %    Comment:   For someone without known diabetes, a hemoglobin A1c value of 6.5% or greater indicates that they may have diabetes and this should be confirmed with a follow-up test.   For someone with known diabetes, a value <7% indicates that their diabetes is well controlled and a value greater than or equal to 7% indicates suboptimal control. A1c targets should be individualized based on duration of diabetes, age, comorbid conditions, and other considerations.   Currently, no consensus exists for use of hemoglobin A1c for diagnosis of diabetes for children.      Mean Plasma Glucose 140 mg/dL     Assessment & Plan:   1. Diabetes mellitus without complication (HCC)   --diabetes is  complicated by peripheral neuropathy , and patient remains at a high risk for more acute and chronic complications of diabetes which include CAD, CVA, CKD, retinopathy, and neuropathy. These are all discussed in detail with the patient.  Patient came with stable A1c of  6.5% . - Recent labs reviewed.  - I have re-counseled the patient on diet management and  and weight loss by adopting a carbohydrate restricted / protein rich  Diet.  - Suggestion is made for patient to avoid simple carbohydrates   from their diet including Cakes , Desserts, Ice Cream,  Soda (  diet and regular) , Sweet Tea , Candies,  Chips, Cookies, Artificial Sweeteners,   and "Sugar-free" Products .  This will help patient to have stable blood glucose profile and potentially avoid unintended  Weight gain.  - Patient is advised to stick to a routine mealtimes to eat 3 meals  a day and avoid unnecessary snacks ( to snack only to correct hypoglycemia).  - The patient has been  scheduled with Norm SaltPenny Crumpton, RDN, CDE for individualized DM education.  - I have approached patient with the following individualized plan to manage diabetes and patient agrees.  -  He will not need insulin treatment for now. -I will  continue with Victoza 1.8 mg daily  and Jardiance 25 mg by mouth daily. -Side effects and precautions discussed with patient.   - Patient specific target  for A1c; LDL, HDL, Triglycerides, and  Waist Circumference were discussed in detail.  2) BP/HTN: controlled. Continue current medications including ACEI/ARB. 3) Lipids/HPL:  continue statins. 4)  Weight/Diet: CDE consult in progress, exercise, and carbohydrates information provided.  5) Chronic Care/Health Maintenance:  -Patient is on ACEI/ARB and Statin medications and encouraged to continue to follow up with Ophthalmology, Podiatrist at least yearly or according to recommendations, and advised to  stay away from smoking. I have recommended yearly flu vaccine and pneumonia vaccination at least every 5 years; moderate intensity exercise for up to 150 minutes weekly; and  sleep for at least 7 hours a day.  - 25 minutes of time was spent on the care of this patient , 50% of which was applied for counseling on diabetes complications and their preventions.  - I advised patient to maintain close follow up with TAPPER,DAVID B, MD for primary care needs.  Patient is asked to bring meter and  blood glucose logs during their next visit.   Follow up plan: -Return in about 4 months (around 04/03/2017) for follow up with pre-visit labs.  Marquis Lunch, MD Phone: [phone]  Fax: [phone]   12/03/2016, 10:33 AM

## 2016-12-03 NOTE — Patient Instructions (Signed)

## 2017-01-04 ENCOUNTER — Other Ambulatory Visit: Payer: Self-pay | Admitting: "Endocrinology

## 2017-03-25 ENCOUNTER — Other Ambulatory Visit: Payer: Self-pay | Admitting: "Endocrinology

## 2017-03-25 LAB — COMPREHENSIVE METABOLIC PANEL
ALBUMIN: 4.1 g/dL (ref 3.6–5.1)
ALK PHOS: 90 U/L (ref 40–115)
ALT: 17 U/L (ref 9–46)
AST: 15 U/L (ref 10–35)
BILIRUBIN TOTAL: 0.7 mg/dL (ref 0.2–1.2)
BUN: 16 mg/dL (ref 7–25)
CO2: 32 mmol/L — ABNORMAL HIGH (ref 20–31)
Calcium: 9.8 mg/dL (ref 8.6–10.3)
Chloride: 95 mmol/L — ABNORMAL LOW (ref 98–110)
Creat: 1.15 mg/dL (ref 0.70–1.25)
GLUCOSE: 142 mg/dL — AB (ref 65–99)
POTASSIUM: 3.8 mmol/L (ref 3.5–5.3)
Sodium: 137 mmol/L (ref 135–146)
TOTAL PROTEIN: 7.6 g/dL (ref 6.1–8.1)

## 2017-03-26 LAB — HEMOGLOBIN A1C
HEMOGLOBIN A1C: 6.5 % — AB (ref <5.7)
MEAN PLASMA GLUCOSE: 140 mg/dL

## 2017-04-01 ENCOUNTER — Ambulatory Visit (INDEPENDENT_AMBULATORY_CARE_PROVIDER_SITE_OTHER): Payer: BLUE CROSS/BLUE SHIELD | Admitting: "Endocrinology

## 2017-04-01 ENCOUNTER — Encounter: Payer: Self-pay | Admitting: "Endocrinology

## 2017-04-01 VITALS — BP 133/86 | HR 79 | Ht 69.0 in | Wt 255.0 lb

## 2017-04-01 DIAGNOSIS — E1169 Type 2 diabetes mellitus with other specified complication: Secondary | ICD-10-CM | POA: Diagnosis not present

## 2017-04-01 DIAGNOSIS — IMO0002 Reserved for concepts with insufficient information to code with codable children: Secondary | ICD-10-CM

## 2017-04-01 DIAGNOSIS — E782 Mixed hyperlipidemia: Secondary | ICD-10-CM | POA: Diagnosis not present

## 2017-04-01 DIAGNOSIS — IMO0001 Reserved for inherently not codable concepts without codable children: Secondary | ICD-10-CM

## 2017-04-01 DIAGNOSIS — I10 Essential (primary) hypertension: Secondary | ICD-10-CM | POA: Diagnosis not present

## 2017-04-01 DIAGNOSIS — Z6837 Body mass index (BMI) 37.0-37.9, adult: Secondary | ICD-10-CM | POA: Diagnosis not present

## 2017-04-01 DIAGNOSIS — E1165 Type 2 diabetes mellitus with hyperglycemia: Secondary | ICD-10-CM

## 2017-04-01 DIAGNOSIS — E6609 Other obesity due to excess calories: Secondary | ICD-10-CM | POA: Diagnosis not present

## 2017-04-01 NOTE — Patient Instructions (Signed)

## 2017-04-01 NOTE — Progress Notes (Signed)
Subjective:    Patient ID: Calvin Shields, male    DOB: 1956-04-29, PCP Louie Boston, MD   Past Medical History:  Diagnosis Date  . Diabetes mellitus without complication (HCC)   . Hyperlipidemia   . Hypertension    Past Surgical History:  Procedure Laterality Date  . right leg surgery      Social History   Social History  . Marital status: Married    Spouse name: N/A  . Number of children: N/A  . Years of education: N/A   Social History Main Topics  . Smoking status: Never Smoker  . Smokeless tobacco: Never Used  . Alcohol use No  . Drug use: No  . Sexual activity: Not Asked   Other Topics Concern  . None   Social History Narrative  . None   Outpatient Encounter Prescriptions as of 04/01/2017  Medication Sig  . allopurinol (ZYLOPRIM) 100 MG tablet Take 100 mg by mouth daily.  Marland Kitchen aspirin 81 MG chewable tablet Chew by mouth daily.  Marland Kitchen ibuprofen (ADVIL,MOTRIN) 800 MG tablet Take 800 mg by mouth every 8 (eight) hours as needed.  Marland Kitchen JARDIANCE 25 MG TABS tablet TAKE 1 TABLET BY MOUTH EVERY MORNING.  Marland Kitchen lisinopril-hydrochlorothiazide (PRINZIDE,ZESTORETIC) 20-25 MG per tablet Take 1 tablet by mouth daily.  . naproxen (NAPROSYN) 500 MG tablet Take 500 mg by mouth 2 (two) times daily with a meal.  . pravastatin (PRAVACHOL) 40 MG tablet TAKE (1) TABLET BY MOUTH ONCE DAILY.  . traMADol-acetaminophen (ULTRACET) 37.5-325 MG tablet Take 1 tablet by mouth every 6 (six) hours as needed.  Marland Kitchen VICTOZA 18 MG/3ML SOPN INJECT 1.8 MG SUBQ EVERY DAY.   No facility-administered encounter medications on file as of 04/01/2017.    ALLERGIES: Allergies  Allergen Reactions  . Codeine Nausea And Vomiting   VACCINATION STATUS:  There is no immunization history on file for this patient.  Diabetes  He presents for his follow-up diabetic visit. He has type 2 diabetes mellitus. His disease course has been stable. There are no hypoglycemic associated symptoms. Pertinent negatives for  hypoglycemia include no confusion, headaches, pallor or seizures. There are no diabetic associated symptoms. Pertinent negatives for diabetes include no chest pain, no fatigue, no polydipsia, no polyphagia, no polyuria and no weakness. There are no hypoglycemic complications. Symptoms are stable. Diabetic complications include peripheral neuropathy. Risk factors for coronary artery disease include diabetes mellitus, dyslipidemia, hypertension, male sex, obesity and sedentary lifestyle. He is compliant with treatment most of the time. His weight is stable. He is following a generally unhealthy diet. When asked about meal planning, he reported none. He has had a previous visit with a dietitian. He never participates in exercise. An ACE inhibitor/angiotensin II receptor blocker is being taken. Eye exam is current.  Hyperlipidemia  This is a chronic problem. The current episode started more than 1 year ago. Exacerbating diseases include diabetes and obesity. Pertinent negatives include no chest pain, myalgias or shortness of breath. Current antihyperlipidemic treatment includes statins. Risk factors for coronary artery disease include diabetes mellitus, dyslipidemia, hypertension, male sex, obesity and a sedentary lifestyle.  Hypertension  This is a chronic problem. The current episode started more than 1 year ago. Pertinent negatives include no chest pain, headaches, neck pain, palpitations or shortness of breath. Risk factors for coronary artery disease include dyslipidemia, diabetes mellitus, obesity, male gender and sedentary lifestyle. Past treatments include ACE inhibitors.     Review of Systems  Constitutional: Negative for chills, fatigue, fever and  unexpected weight change.  HENT: Negative for dental problem, mouth sores and trouble swallowing.   Eyes: Negative for visual disturbance.  Respiratory: Negative for cough, choking, chest tightness, shortness of breath and wheezing.   Cardiovascular:  Negative for chest pain, palpitations and leg swelling.  Gastrointestinal: Negative for abdominal distention, abdominal pain, constipation, diarrhea, nausea and vomiting.  Endocrine: Negative for polydipsia, polyphagia and polyuria.  Genitourinary: Negative for dysuria, flank pain, hematuria and urgency.  Musculoskeletal: Negative for back pain, gait problem, myalgias and neck pain.  Skin: Negative for pallor, rash and wound.  Neurological: Negative for seizures, syncope, weakness, numbness and headaches.  Psychiatric/Behavioral: Negative.  Negative for confusion and dysphoric mood.    Objective:    BP 133/86   Pulse 79   Ht  (1.753 m)   Wt 255 lb (115.7 kg)   BMI 37.66 kg/m   Wt Readings from Last 3 Encounters:  04/01/17 255 lb (115.7 kg)  12/03/16 256 lb (116.1 kg)  08/27/16 250 lb (113.4 kg)    Physical Exam  Constitutional: He is oriented to person, place, and time. He appears well-developed and well-nourished. He is cooperative. No distress.  HENT:  Head: Normocephalic and atraumatic.  Eyes: EOM are normal.  Neck: Normal range of motion. Neck supple. No tracheal deviation present. No thyromegaly present.  Cardiovascular: Normal rate, S1 normal, S2 normal and normal heart sounds.  Exam reveals no gallop.   No murmur heard. Pulses:      Dorsalis pedis pulses are 1+ on the right side, and 1+ on the left side.       Posterior tibial pulses are 1+ on the right side, and 1+ on the left side.  Pulmonary/Chest: Breath sounds normal. No respiratory distress. He has no wheezes.  Abdominal: Soft. Bowel sounds are normal. He exhibits no distension. There is no tenderness. There is no guarding and no CVA tenderness.  Musculoskeletal: He exhibits no edema.       Right shoulder: He exhibits no swelling and no deformity.  Neurological: He is alert and oriented to person, place, and time. He has normal strength and normal reflexes. No cranial nerve deficit or sensory deficit. Gait  normal.  Skin: Skin is warm and dry. No rash noted. No cyanosis. Nails show no clubbing.  Psychiatric: He has a normal mood and affect. His speech is normal and behavior is normal. Judgment and thought content normal. Cognition and memory are normal.   Recent Results (from the past 2160 hour(s))  Comprehensive metabolic panel     Status: Abnormal   Collection Time: 03/25/17 10:55 AM  Result Value Ref Range   Sodium 137 135 - 146 mmol/L   Potassium 3.8 3.5 - 5.3 mmol/L   Chloride 95 (L) 98 - 110 mmol/L   CO2 32 (H) 20 - 31 mmol/L   Glucose, Bld 142 (H) 65 - 99 mg/dL   BUN 16 7 - 25 mg/dL   Creat 1.61 0.96 - 0.45 mg/dL    Comment:   For patients > or = 62 years of age: The upper reference limit for Creatinine is approximately 13% higher for people identified as African-American.      Total Bilirubin 0.7 0.2 - 1.2 mg/dL   Alkaline Phosphatase 90 40 - 115 U/L   AST 15 10 - 35 U/L   ALT 17 9 - 46 U/L   Total Protein 7.6 6.1 - 8.1 g/dL   Albumin 4.1 3.6 - 5.1 g/dL   Calcium 9.8 8.6 -  10.3 mg/dL  Hemoglobin Z6X     Status: Abnormal   Collection Time: 03/25/17 10:55 AM  Result Value Ref Range   Hgb A1c MFr Bld 6.5 (H) <5.7 %    Comment:   For someone without known diabetes, a hemoglobin A1c value of 6.5% or greater indicates that they may have diabetes and this should be confirmed with a follow-up test.   For someone with known diabetes, a value <7% indicates that their diabetes is well controlled and a value greater than or equal to 7% indicates suboptimal control. A1c targets should be individualized based on duration of diabetes, age, comorbid conditions, and other considerations.   Currently, no consensus exists for use of hemoglobin A1c for diagnosis of diabetes for children.      Mean Plasma Glucose 140 mg/dL     Assessment & Plan:   1. Diabetes mellitus without complication (HCC)   --diabetes is  complicated by peripheral neuropathy , and patient remains at a  high risk for more acute and chronic complications of diabetes which include CAD, CVA, CKD, retinopathy, and neuropathy. These are all discussed in detail with the patient.  Patient came with stable A1c of  6.5% . - Recent labs reviewed.  - I have re-counseled the patient on diet management and  and weight loss by adopting a carbohydrate restricted / protein rich  Diet.  - Suggestion is made for patient to avoid simple carbohydrates   from their diet including Cakes , Desserts, Ice Cream,  Soda (  diet and regular) , Sweet Tea , Candies,  Chips, Cookies, Artificial Sweeteners,   and "Sugar-free" Products .  This will help patient to have stable blood glucose profile and potentially avoid unintended  Weight gain.  - Patient is advised to stick to a routine mealtimes to eat 3 meals  a day and avoid unnecessary snacks ( to snack only to correct hypoglycemia).  - The patient has been  scheduled with Norm Salt, RDN, CDE for individualized DM education.  - I have approached patient with the following individualized plan to manage diabetes and patient agrees.  -  He will not need insulin treatment for now. -I will continue with Victoza 1.8 mg daily and Jardiance 25 mg by mouth daily. -Side effects and precautions discussed with patient.   - Patient specific target  for A1c; LDL, HDL, Triglycerides, and  Waist Circumference were discussed in detail.  2) BP/HTN: controlled. Continue current medications including ACEI/ARB. 3) Lipids/HPL:  continue statins. 4)  Weight/Diet: CDE consult in progress, exercise, and carbohydrates information provided.  5) Chronic Care/Health Maintenance:  -Patient is on ACEI/ARB and Statin medications and encouraged to continue to follow up with Ophthalmology, Podiatrist at least yearly or according to recommendations, and advised to  stay away from smoking. I have recommended yearly flu vaccine and pneumonia vaccination at least every 5 years; moderate intensity  exercise for up to 150 minutes weekly; and  sleep for at least 7 hours a day.  - 25 minutes of time was spent on the care of this patient , 50% of which was applied for counseling on diabetes complications and their preventions.  - I advised patient to maintain close follow up with TAPPER,DAVID B, MD for primary care needs.  Patient is asked to bring meter and  blood glucose logs during their next visit.   Follow up plan: -Return in about 3 months (around 07/01/2017) for follow up with pre-visit labs.  Marquis Lunch, MD Phone: [phone]  Fax: [phone]   04/01/2017, 10:22 AM

## 2017-05-24 DIAGNOSIS — B351 Tinea unguium: Secondary | ICD-10-CM | POA: Insufficient documentation

## 2017-05-29 ENCOUNTER — Other Ambulatory Visit: Payer: Self-pay | Admitting: "Endocrinology

## 2017-06-24 ENCOUNTER — Other Ambulatory Visit: Payer: Self-pay | Admitting: "Endocrinology

## 2017-06-24 LAB — COMPREHENSIVE METABOLIC PANEL
ALBUMIN: 4.3 g/dL (ref 3.6–5.1)
ALT: 15 U/L (ref 9–46)
AST: 14 U/L (ref 10–35)
Alkaline Phosphatase: 100 U/L (ref 40–115)
BUN: 20 mg/dL (ref 7–25)
CHLORIDE: 98 mmol/L (ref 98–110)
CO2: 29 mmol/L (ref 20–31)
Calcium: 10 mg/dL (ref 8.6–10.3)
Creat: 1.19 mg/dL (ref 0.70–1.25)
Glucose, Bld: 155 mg/dL — ABNORMAL HIGH (ref 65–99)
POTASSIUM: 4 mmol/L (ref 3.5–5.3)
Sodium: 136 mmol/L (ref 135–146)
TOTAL PROTEIN: 7.6 g/dL (ref 6.1–8.1)
Total Bilirubin: 0.5 mg/dL (ref 0.2–1.2)

## 2017-06-25 LAB — HEMOGLOBIN A1C
Hgb A1c MFr Bld: 7.1 % — ABNORMAL HIGH (ref <5.7)
Mean Plasma Glucose: 157 mg/dL

## 2017-07-15 ENCOUNTER — Encounter: Payer: Self-pay | Admitting: "Endocrinology

## 2017-07-15 ENCOUNTER — Ambulatory Visit (INDEPENDENT_AMBULATORY_CARE_PROVIDER_SITE_OTHER): Payer: BLUE CROSS/BLUE SHIELD | Admitting: "Endocrinology

## 2017-07-15 VITALS — BP 131/76 | HR 71 | Ht 69.0 in | Wt 260.0 lb

## 2017-07-15 DIAGNOSIS — E1165 Type 2 diabetes mellitus with hyperglycemia: Secondary | ICD-10-CM

## 2017-07-15 DIAGNOSIS — E782 Mixed hyperlipidemia: Secondary | ICD-10-CM | POA: Diagnosis not present

## 2017-07-15 DIAGNOSIS — I10 Essential (primary) hypertension: Secondary | ICD-10-CM

## 2017-07-15 DIAGNOSIS — E6609 Other obesity due to excess calories: Secondary | ICD-10-CM

## 2017-07-15 DIAGNOSIS — Z6837 Body mass index (BMI) 37.0-37.9, adult: Secondary | ICD-10-CM

## 2017-07-15 DIAGNOSIS — E1169 Type 2 diabetes mellitus with other specified complication: Secondary | ICD-10-CM | POA: Diagnosis not present

## 2017-07-15 DIAGNOSIS — IMO0001 Reserved for inherently not codable concepts without codable children: Secondary | ICD-10-CM

## 2017-07-15 DIAGNOSIS — IMO0002 Reserved for concepts with insufficient information to code with codable children: Secondary | ICD-10-CM

## 2017-07-15 NOTE — Progress Notes (Signed)
Subjective:    Patient ID: Calvin Shields, male    DOB: Oct 08, 1956, PCP Calvin Bostonapper, David B., MD   Past Medical History:  Diagnosis Date  . Diabetes mellitus without complication (HCC)   . Hyperlipidemia   . Hypertension    Past Surgical History:  Procedure Laterality Date  . right leg surgery      Social History   Social History  . Marital status: Married    Spouse name: N/A  . Number of children: N/A  . Years of education: N/A   Social History Main Topics  . Smoking status: Never Smoker  . Smokeless tobacco: Never Used  . Alcohol use No  . Drug use: No  . Sexual activity: Not Asked   Other Topics Concern  . None   Social History Narrative  . None   Outpatient Encounter Prescriptions as of 07/15/2017  Medication Sig  . allopurinol (ZYLOPRIM) 100 MG tablet Take 100 mg by mouth daily.  Marland Kitchen. aspirin 81 MG chewable tablet Chew by mouth daily.  Marland Kitchen. ibuprofen (ADVIL,MOTRIN) 800 MG tablet Take 800 mg by mouth every 8 (eight) hours as needed.  Marland Kitchen. JARDIANCE 25 MG TABS tablet TAKE 1 TABLET BY MOUTH EVERY MORNING.  Marland Kitchen. lisinopril-hydrochlorothiazide (PRINZIDE,ZESTORETIC) 20-25 MG per tablet Take 1 tablet by mouth daily.  . naproxen (NAPROSYN) 500 MG tablet Take 500 mg by mouth 2 (two) times daily with a meal.  . pravastatin (PRAVACHOL) 40 MG tablet TAKE (1) TABLET BY MOUTH ONCE DAILY.  . traMADol-acetaminophen (ULTRACET) 37.5-325 MG tablet Take 1 tablet by mouth every 6 (six) hours as needed.  Marland Kitchen. VICTOZA 18 MG/3ML SOPN INJECT 1.8 MG SUBQ EVERY DAY.   No facility-administered encounter medications on file as of 07/15/2017.    ALLERGIES: Allergies  Allergen Reactions  . Codeine Nausea And Vomiting   VACCINATION STATUS:  There is no immunization history on file for this patient.  Diabetes  He presents for his follow-up diabetic visit. He has type 2 diabetes mellitus. His disease course has been worsening. There are no hypoglycemic associated symptoms. Pertinent negatives for  hypoglycemia include no confusion, headaches, pallor or seizures. There are no diabetic associated symptoms. Pertinent negatives for diabetes include no chest pain, no fatigue, no polydipsia, no polyphagia, no polyuria and no weakness. There are no hypoglycemic complications. Symptoms are worsening. Diabetic complications include peripheral neuropathy. Risk factors for coronary artery disease include diabetes mellitus, dyslipidemia, hypertension, male sex, obesity and sedentary lifestyle. He is compliant with treatment most of the time. His weight is increasing steadily. He is following a generally unhealthy diet. When asked about meal planning, he reported none. He has had a previous visit with a dietitian. He never participates in exercise. An ACE inhibitor/angiotensin II receptor blocker is being taken. Eye exam is current.  Hyperlipidemia  This is a chronic problem. The current episode started more than 1 year ago. Exacerbating diseases include diabetes and obesity. Pertinent negatives include no chest pain, myalgias or shortness of breath. Current antihyperlipidemic treatment includes statins. Risk factors for coronary artery disease include diabetes mellitus, dyslipidemia, hypertension, male sex, obesity and a sedentary lifestyle.  Hypertension  This is a chronic problem. The current episode started more than 1 year ago. Pertinent negatives include no chest pain, headaches, neck pain, palpitations or shortness of breath. Risk factors for coronary artery disease include dyslipidemia, diabetes mellitus, obesity, male gender and sedentary lifestyle. Past treatments include ACE inhibitors.     Review of Systems  Constitutional: Negative for chills, fatigue,  fever and unexpected weight change.  HENT: Negative for dental problem, mouth sores and trouble swallowing.   Eyes: Negative for visual disturbance.  Respiratory: Negative for cough, choking, chest tightness, shortness of breath and wheezing.    Cardiovascular: Negative for chest pain, palpitations and leg swelling.  Gastrointestinal: Negative for abdominal distention, abdominal pain, constipation, diarrhea, nausea and vomiting.  Endocrine: Negative for polydipsia, polyphagia and polyuria.  Genitourinary: Negative for dysuria, flank pain, hematuria and urgency.  Musculoskeletal: Negative for back pain, gait problem, myalgias and neck pain.  Skin: Negative for pallor, rash and wound.  Neurological: Negative for seizures, syncope, weakness, numbness and headaches.  Psychiatric/Behavioral: Negative.  Negative for confusion and dysphoric mood.    Objective:    BP 131/76   Pulse 71   Ht 5\' 9"  (1.753 m)   Wt 260 lb (117.9 kg)   BMI 38.40 kg/m   Wt Readings from Last 3 Encounters:  07/15/17 260 lb (117.9 kg)  04/01/17 255 lb (115.7 kg)  12/03/16 256 lb (116.1 kg)    Physical Exam  Constitutional: He is oriented to person, place, and time. He appears well-developed and well-nourished. He is cooperative. No distress.  HENT:  Head: Normocephalic and atraumatic.  Eyes: EOM are normal.  Neck: Normal range of motion. Neck supple. No tracheal deviation present. No thyromegaly present.  Cardiovascular: Normal rate, S1 normal, S2 normal and normal heart sounds.  Exam reveals no gallop.   No murmur heard. Pulses:      Dorsalis pedis pulses are 1+ on the right side, and 1+ on the left side.       Posterior tibial pulses are 1+ on the right side, and 1+ on the left side.  Pulmonary/Chest: Breath sounds normal. No respiratory distress. He has no wheezes.  Abdominal: Soft. Bowel sounds are normal. He exhibits no distension. There is no tenderness. There is no guarding and no CVA tenderness.  Musculoskeletal: He exhibits deformity. He exhibits no edema.       Right shoulder: He exhibits no swelling and no deformity.  He has significant call us on his right toe following with a podiatrist.  Neurological: He is alert and oriented to person,  place, and time. He has normal strength and normal reflexes. No cranial nerve deficit or sensory deficit. Gait normal.  Skin: Skin is warm and dry. No rash noted. No cyanosis. Nails show no clubbing.  Psychiatric: He has a normal mood and affect. His speech is normal and behavior is normal. Judgment and thought content normal. Cognition and memory are normal.   Recent Results (from the past 2160 hour(s))  Comprehensive metabolic panel     Status: Abnormal   Collection Time: 06/24/17  8:59 AM  Result Value Ref Range   Sodium 136 135 - 146 mmol/L   Potassium 4.0 3.5 - 5.3 mmol/L   Chloride 98 98 - 110 mmol/L   CO2 29 20 - 31 mmol/L   Glucose, Bld 155 (H) 65 - 99 mg/dL   BUN 20 7 - 25 mg/dL   Creat 0.98 1.19 - 1.47 mg/dL    Comment:   For patients > or = 62 years of age: The upper reference limit for Creatinine is approximately 13% higher for people identified as African-American.      Total Bilirubin 0.5 0.2 - 1.2 mg/dL   Alkaline Phosphatase 100 40 - 115 U/L   AST 14 10 - 35 U/L   ALT 15 9 - 46 U/L   Total Protein 7.6  6.1 - 8.1 g/dL   Albumin 4.3 3.6 - 5.1 g/dL   Calcium 40.9 8.6 - 81.1 mg/dL  Hemoglobin B1Y     Status: Abnormal   Collection Time: 06/24/17  8:59 AM  Result Value Ref Range   Hgb A1c MFr Bld 7.1 (H) <5.7 %    Comment:   For someone without known diabetes, a hemoglobin A1c value of 6.5% or greater indicates that they may have diabetes and this should be confirmed with a follow-up test.   For someone with known diabetes, a value <7% indicates that their diabetes is well controlled and a value greater than or equal to 7% indicates suboptimal control. A1c targets should be individualized based on duration of diabetes, age, comorbid conditions, and other considerations.   Currently, no consensus exists for use of hemoglobin A1c for diagnosis of diabetes for children.      Mean Plasma Glucose 157 mg/dL     Assessment & Plan:   1. Diabetes mellitus without  complication (HCC)   --diabetes is  complicated by peripheral neuropathy , and patient remains at a high risk for more acute and chronic complications of diabetes which include CAD, CVA, CKD, retinopathy, and neuropathy. These are all discussed in detail with the patient.  Patient came with stable A1c of   7.1% increasing from 6.5% . - Recent labs reviewed.  - I have re-counseled the patient on diet management and  and weight loss by adopting a carbohydrate restricted / protein rich  Diet.  - Suggestion is made for patient to avoid simple carbohydrates   from his diet including Cakes , Desserts, Ice Cream,  Soda (  diet and regular) , Sweet Tea , Candies,  Chips, Cookies, Artificial Sweeteners,   and "Sugar-free" Products .  This will help patient to have stable blood glucose profile and potentially avoid unintended  Weight gain.  - Patient is advised to stick to a routine mealtimes to eat 3 meals  a day and avoid unnecessary snacks ( to snack only to correct hypoglycemia).  - The patient has been  scheduled with Norm Salt, RDN, CDE for individualized DM education.  - I have approached patient with the following individualized plan to manage diabetes and patient agrees.  -  He will not need insulin treatment for now. -I will continue with Victoza 1.8 mg daily and Jardiance 25 mg by mouth daily. -Side effects and precautions discussed with patient.   - Patient specific target  for A1c; LDL, HDL, Triglycerides, and  Waist Circumference were discussed in detail.  2) BP/HTN: controlled. Continue current medications including ACEI/ARB. 3) Lipids/HPL:  continue statins. 4)  Weight/Diet:  He gain any weight progressively, CDE consult in progress, exercise, and carbohydrates information provided.  5) Chronic Care/Health Maintenance:  -Patient is on ACEI/ARB and Statin medications and encouraged to continue to follow up with Ophthalmology, Podiatrist at least yearly or according to  recommendations, and advised to  stay away from smoking. I have recommended yearly flu vaccine and pneumonia vaccination at least every 5 years; moderate intensity exercise for up to 150 minutes weekly; and  sleep for at least 7 hours a day.  - 25 minutes of time was spent on the care of this patient , 50% of which was applied for counseling on diabetes complications and their preventions.  - I advised patient to maintain close follow up with Calvin Boston., MD for primary care needs.   Follow up plan: -Return in about 4 months (around  11/15/2017) for follow up with pre-visit labs.  Marquis Lunch, MD Phone: [phone]  Fax: [phone]   07/15/2017, 10:48 AM

## 2017-08-13 DIAGNOSIS — E11621 Type 2 diabetes mellitus with foot ulcer: Secondary | ICD-10-CM | POA: Insufficient documentation

## 2017-09-03 ENCOUNTER — Encounter (HOSPITAL_COMMUNITY): Payer: Self-pay | Admitting: Emergency Medicine

## 2017-09-03 ENCOUNTER — Inpatient Hospital Stay (HOSPITAL_COMMUNITY)
Admission: EM | Admit: 2017-09-03 | Discharge: 2017-09-09 | DRG: 617 | Disposition: A | Discharge status: Home / Self Care | Payer: BLUE CROSS/BLUE SHIELD | Attending: Internal Medicine | Admitting: Internal Medicine

## 2017-09-03 ENCOUNTER — Emergency Department (HOSPITAL_COMMUNITY): Payer: BLUE CROSS/BLUE SHIELD

## 2017-09-03 DIAGNOSIS — I10 Essential (primary) hypertension: Secondary | ICD-10-CM | POA: Diagnosis present

## 2017-09-03 DIAGNOSIS — L02612 Cutaneous abscess of left foot: Secondary | ICD-10-CM | POA: Diagnosis present

## 2017-09-03 DIAGNOSIS — Z6837 Body mass index (BMI) 37.0-37.9, adult: Secondary | ICD-10-CM | POA: Diagnosis not present

## 2017-09-03 DIAGNOSIS — E1165 Type 2 diabetes mellitus with hyperglycemia: Secondary | ICD-10-CM | POA: Diagnosis present

## 2017-09-03 DIAGNOSIS — E6609 Other obesity due to excess calories: Secondary | ICD-10-CM | POA: Diagnosis present

## 2017-09-03 DIAGNOSIS — M009 Pyogenic arthritis, unspecified: Secondary | ICD-10-CM | POA: Diagnosis present

## 2017-09-03 DIAGNOSIS — L97529 Non-pressure chronic ulcer of other part of left foot with unspecified severity: Secondary | ICD-10-CM | POA: Diagnosis present

## 2017-09-03 DIAGNOSIS — E782 Mixed hyperlipidemia: Secondary | ICD-10-CM | POA: Diagnosis present

## 2017-09-03 DIAGNOSIS — B9562 Methicillin resistant Staphylococcus aureus infection as the cause of diseases classified elsewhere: Secondary | ICD-10-CM | POA: Diagnosis present

## 2017-09-03 DIAGNOSIS — M86172 Other acute osteomyelitis, left ankle and foot: Secondary | ICD-10-CM | POA: Diagnosis present

## 2017-09-03 DIAGNOSIS — IMO0002 Reserved for concepts with insufficient information to code with codable children: Secondary | ICD-10-CM | POA: Diagnosis present

## 2017-09-03 DIAGNOSIS — Z791 Long term (current) use of non-steroidal anti-inflammatories (NSAID): Secondary | ICD-10-CM

## 2017-09-03 DIAGNOSIS — L03032 Cellulitis of left toe: Secondary | ICD-10-CM | POA: Diagnosis present

## 2017-09-03 DIAGNOSIS — Z79899 Other long term (current) drug therapy: Secondary | ICD-10-CM | POA: Diagnosis not present

## 2017-09-03 DIAGNOSIS — R079 Chest pain, unspecified: Secondary | ICD-10-CM | POA: Diagnosis present

## 2017-09-03 DIAGNOSIS — Z885 Allergy status to narcotic agent status: Secondary | ICD-10-CM

## 2017-09-03 DIAGNOSIS — M8609 Acute hematogenous osteomyelitis, multiple sites: Secondary | ICD-10-CM | POA: Diagnosis not present

## 2017-09-03 DIAGNOSIS — R0602 Shortness of breath: Secondary | ICD-10-CM | POA: Diagnosis present

## 2017-09-03 DIAGNOSIS — E66812 Obesity, class 2: Secondary | ICD-10-CM

## 2017-09-03 DIAGNOSIS — E1169 Type 2 diabetes mellitus with other specified complication: Principal | ICD-10-CM | POA: Diagnosis present

## 2017-09-03 DIAGNOSIS — Z6839 Body mass index (BMI) 39.0-39.9, adult: Secondary | ICD-10-CM

## 2017-09-03 DIAGNOSIS — M869 Osteomyelitis, unspecified: Secondary | ICD-10-CM | POA: Diagnosis present

## 2017-09-03 DIAGNOSIS — M86272 Subacute osteomyelitis, left ankle and foot: Secondary | ICD-10-CM | POA: Diagnosis not present

## 2017-09-03 DIAGNOSIS — Z7982 Long term (current) use of aspirin: Secondary | ICD-10-CM | POA: Diagnosis not present

## 2017-09-03 DIAGNOSIS — E11621 Type 2 diabetes mellitus with foot ulcer: Secondary | ICD-10-CM | POA: Diagnosis present

## 2017-09-03 DIAGNOSIS — R52 Pain, unspecified: Secondary | ICD-10-CM

## 2017-09-03 DIAGNOSIS — M8600 Acute hematogenous osteomyelitis, unspecified site: Secondary | ICD-10-CM | POA: Diagnosis not present

## 2017-09-03 LAB — DIFFERENTIAL
Basophils Absolute: 0 10*3/uL (ref 0.0–0.1)
Basophils Relative: 0 %
Eosinophils Absolute: 0.2 10*3/uL (ref 0.0–0.7)
Eosinophils Relative: 1 %
LYMPHS PCT: 16 %
Lymphs Abs: 1.8 10*3/uL (ref 0.7–4.0)
Monocytes Absolute: 1.2 10*3/uL — ABNORMAL HIGH (ref 0.1–1.0)
Monocytes Relative: 11 %
NEUTROS ABS: 8.1 10*3/uL — AB (ref 1.7–7.7)
NEUTROS PCT: 72 %

## 2017-09-03 LAB — HEPATIC FUNCTION PANEL
ALT: 21 U/L (ref 17–63)
AST: 19 U/L (ref 15–41)
Albumin: 3.7 g/dL (ref 3.5–5.0)
Alkaline Phosphatase: 85 U/L (ref 38–126)
BILIRUBIN INDIRECT: 0.7 mg/dL (ref 0.3–0.9)
Bilirubin, Direct: 0.2 mg/dL (ref 0.1–0.5)
TOTAL PROTEIN: 8.1 g/dL (ref 6.5–8.1)
Total Bilirubin: 0.9 mg/dL (ref 0.3–1.2)

## 2017-09-03 LAB — CBC
HCT: 35.8 % — ABNORMAL LOW (ref 39.0–52.0)
HEMOGLOBIN: 11.6 g/dL — AB (ref 13.0–17.0)
MCH: 28.2 pg (ref 26.0–34.0)
MCHC: 32.4 g/dL (ref 30.0–36.0)
MCV: 87.1 fL (ref 78.0–100.0)
Platelets: 415 10*3/uL — ABNORMAL HIGH (ref 150–400)
RBC: 4.11 MIL/uL — ABNORMAL LOW (ref 4.22–5.81)
RDW: 13.5 % (ref 11.5–15.5)
WBC: 11.2 10*3/uL — ABNORMAL HIGH (ref 4.0–10.5)

## 2017-09-03 LAB — BASIC METABOLIC PANEL
ANION GAP: 10 (ref 5–15)
BUN: 17 mg/dL (ref 6–20)
CALCIUM: 8.9 mg/dL (ref 8.9–10.3)
CO2: 26 mmol/L (ref 22–32)
Chloride: 99 mmol/L — ABNORMAL LOW (ref 101–111)
Creatinine, Ser: 1.17 mg/dL (ref 0.61–1.24)
GLUCOSE: 150 mg/dL — AB (ref 65–99)
Potassium: 3.9 mmol/L (ref 3.5–5.1)
SODIUM: 135 mmol/L (ref 135–145)

## 2017-09-03 LAB — GLUCOSE, CAPILLARY: GLUCOSE-CAPILLARY: 106 mg/dL — AB (ref 65–99)

## 2017-09-03 LAB — TROPONIN I

## 2017-09-03 MED ORDER — VANCOMYCIN HCL IN DEXTROSE 750-5 MG/150ML-% IV SOLN
INTRAVENOUS | Status: AC
  Filled 2017-09-03: qty 150

## 2017-09-03 MED ORDER — LISINOPRIL-HYDROCHLOROTHIAZIDE 20-25 MG PO TABS: 1.0000 | ORAL_TABLET | Freq: Every day | ORAL | Status: DC

## 2017-09-03 MED ORDER — LISINOPRIL 10 MG PO TABS
20.0000 mg | ORAL_TABLET | Freq: Every day | ORAL | Status: DC
  Administered 2017-09-04 – 2017-09-09 (×6): 20 mg via ORAL
  Filled 2017-09-03 (×6): qty 2

## 2017-09-03 MED ORDER — PIPERACILLIN-TAZOBACTAM 3.375 G IVPB 30 MIN
3.3750 g | Freq: Once | INTRAVENOUS | Status: AC
  Administered 2017-09-03: 3.375 g via INTRAVENOUS
  Filled 2017-09-03: qty 50

## 2017-09-03 MED ORDER — INSULIN ASPART 100 UNIT/ML ~~LOC~~ SOLN
0.0000 [IU] | Freq: Every day | SUBCUTANEOUS | Status: DC
  Administered 2017-09-07: 3 [IU] via SUBCUTANEOUS

## 2017-09-03 MED ORDER — SENNOSIDES-DOCUSATE SODIUM 8.6-50 MG PO TABS: 1.0000 | ORAL_TABLET | Freq: Every evening | ORAL | Status: DC | PRN

## 2017-09-03 MED ORDER — OXYCODONE HCL 5 MG PO TABS
5.0000 mg | ORAL_TABLET | ORAL | Status: DC | PRN
  Administered 2017-09-03 – 2017-09-09 (×12): 5 mg via ORAL
  Filled 2017-09-03 (×13): qty 1

## 2017-09-03 MED ORDER — HYDROCHLOROTHIAZIDE 25 MG PO TABS
25.0000 mg | ORAL_TABLET | Freq: Every day | ORAL | Status: DC
  Administered 2017-09-04 – 2017-09-09 (×6): 25 mg via ORAL
  Filled 2017-09-03 (×6): qty 1

## 2017-09-03 MED ORDER — ONDANSETRON HCL 4 MG PO TABS
4.0000 mg | ORAL_TABLET | Freq: Four times a day (QID) | ORAL | Status: DC | PRN
  Administered 2017-09-06 – 2017-09-07 (×2): 4 mg via ORAL
  Filled 2017-09-03 (×2): qty 1

## 2017-09-03 MED ORDER — SODIUM CHLORIDE 0.9 % IV SOLN
250.0000 mL | INTRAVENOUS | Status: DC | PRN
  Administered 2017-09-08: 13:00:00 via INTRAVENOUS

## 2017-09-03 MED ORDER — ASPIRIN EC 81 MG PO TBEC
81.0000 mg | DELAYED_RELEASE_TABLET | Freq: Every day | ORAL | Status: DC
  Administered 2017-09-03 – 2017-09-09 (×6): 81 mg via ORAL
  Filled 2017-09-03 (×6): qty 1

## 2017-09-03 MED ORDER — SODIUM CHLORIDE 0.9% FLUSH
3.0000 mL | INTRAVENOUS | Status: DC | PRN
Start: 2017-09-03 — End: 2017-09-09
  Administered 2017-09-09: 3 mL via INTRAVENOUS
  Filled 2017-09-03: qty 3

## 2017-09-03 MED ORDER — ONDANSETRON HCL 4 MG/2ML IJ SOLN
4.0000 mg | Freq: Four times a day (QID) | INTRAMUSCULAR | Status: DC | PRN
  Administered 2017-09-08 – 2017-09-09 (×2): 4 mg via INTRAVENOUS
  Filled 2017-09-03 (×2): qty 2

## 2017-09-03 MED ORDER — ENOXAPARIN SODIUM 60 MG/0.6ML ~~LOC~~ SOLN
55.0000 mg | SUBCUTANEOUS | Status: DC
  Administered 2017-09-04 – 2017-09-06 (×3): 55 mg via SUBCUTANEOUS
  Filled 2017-09-03 (×4): qty 0.6

## 2017-09-03 MED ORDER — CANAGLIFLOZIN 100 MG PO TABS
100.0000 mg | ORAL_TABLET | Freq: Every day | ORAL | Status: DC
  Administered 2017-09-04 – 2017-09-09 (×6): 100 mg via ORAL
  Filled 2017-09-03 (×7): qty 1

## 2017-09-03 MED ORDER — ADULT MULTIVITAMIN W/MINERALS CH
1.0000 | ORAL_TABLET | Freq: Every day | ORAL | Status: DC
  Administered 2017-09-03 – 2017-09-09 (×7): 1 via ORAL
  Filled 2017-09-03 (×7): qty 1

## 2017-09-03 MED ORDER — INSULIN ASPART 100 UNIT/ML ~~LOC~~ SOLN
3.0000 [IU] | Freq: Three times a day (TID) | SUBCUTANEOUS | Status: DC
  Administered 2017-09-05 – 2017-09-09 (×12): 3 [IU] via SUBCUTANEOUS

## 2017-09-03 MED ORDER — PIPERACILLIN-TAZOBACTAM 3.375 G IVPB
3.3750 g | Freq: Three times a day (TID) | INTRAVENOUS | Status: DC
  Administered 2017-09-03 – 2017-09-09 (×18): 3.375 g via INTRAVENOUS
  Filled 2017-09-03 (×16): qty 50

## 2017-09-03 MED ORDER — ENOXAPARIN SODIUM 40 MG/0.4ML ~~LOC~~ SOLN
40.0000 mg | SUBCUTANEOUS | Status: DC
  Filled 2017-09-03: qty 0.4

## 2017-09-03 MED ORDER — VANCOMYCIN HCL IN DEXTROSE 1-5 GM/200ML-% IV SOLN
1000.0000 mg | Freq: Once | INTRAVENOUS | Status: AC
  Administered 2017-09-03: 1000 mg via INTRAVENOUS
  Filled 2017-09-03: qty 200

## 2017-09-03 MED ORDER — ACETAMINOPHEN 650 MG RE SUPP: 650.0000 mg | Freq: Four times a day (QID) | RECTAL | Status: DC | PRN

## 2017-09-03 MED ORDER — INSULIN ASPART 100 UNIT/ML ~~LOC~~ SOLN
0.0000 [IU] | Freq: Three times a day (TID) | SUBCUTANEOUS | Status: DC
  Administered 2017-09-05: 2 [IU] via SUBCUTANEOUS
  Administered 2017-09-05: 3 [IU] via SUBCUTANEOUS
  Administered 2017-09-05: 2 [IU] via SUBCUTANEOUS
  Administered 2017-09-06 – 2017-09-07 (×2): 3 [IU] via SUBCUTANEOUS
  Administered 2017-09-07: 2 [IU] via SUBCUTANEOUS
  Administered 2017-09-08 – 2017-09-09 (×4): 3 [IU] via SUBCUTANEOUS

## 2017-09-03 MED ORDER — SODIUM CHLORIDE 0.9% FLUSH
3.0000 mL | Freq: Two times a day (BID) | INTRAVENOUS | Status: DC
  Administered 2017-09-04 – 2017-09-09 (×8): 3 mL via INTRAVENOUS

## 2017-09-03 MED ORDER — PRAVASTATIN SODIUM 40 MG PO TABS
40.0000 mg | ORAL_TABLET | Freq: Every day | ORAL | Status: DC
  Administered 2017-09-03 – 2017-09-08 (×6): 40 mg via ORAL
  Filled 2017-09-03 (×6): qty 1

## 2017-09-03 MED ORDER — VANCOMYCIN HCL IN DEXTROSE 750-5 MG/150ML-% IV SOLN
750.0000 mg | Freq: Two times a day (BID) | INTRAVENOUS | Status: DC
  Administered 2017-09-04 – 2017-09-09 (×10): 750 mg via INTRAVENOUS
  Filled 2017-09-03 (×13): qty 150

## 2017-09-03 MED ORDER — VANCOMYCIN HCL 10 G IV SOLR
1500.0000 mg | Freq: Once | INTRAVENOUS | Status: AC
  Administered 2017-09-03: 1500 mg via INTRAVENOUS
  Filled 2017-09-03 (×2): qty 1500

## 2017-09-03 MED ORDER — ACETAMINOPHEN 325 MG PO TABS: 650.0000 mg | ORAL_TABLET | Freq: Four times a day (QID) | ORAL | Status: DC | PRN

## 2017-09-03 NOTE — ED Notes (Addendum)
Patient transported to X-ray via Doctor, general practicestretcher by [facility] [employer].

## 2017-09-03 NOTE — ED Notes (Signed)
Patient returned from xray via stretcher by Faith.  Side rails up.  Monitor in place.

## 2017-09-03 NOTE — ED Triage Notes (Signed)
Pt reports being treated for diabetic ulcer on left foot for last several weeks. Pt reports redness and swelling noted since yesterday. Pt reports chest pain and shortness of breath started this am. Pt alert and oriented. Pt reports "burning" sensation in chest with movement.

## 2017-09-03 NOTE — ED Provider Notes (Signed)
[facility]-EMERGENCY DEPT Provider Note   CSN: [id number] Arrival date & time: 09/03/17  0704     History   Chief Complaint Chief Complaint  Patient presents with  . Chest Pain    HPI Calvin Shields is a 61 y.o. male.  Patient states that his left great toe has become swollen red tender   The history is provided by the patient. No language interpreter was used.  Foot Pain  This is a new problem. The current episode started more than 2 days ago. The problem occurs constantly. The problem has not changed since onset.Associated symptoms include chest pain. Pertinent negatives include no abdominal pain and no headaches. Nothing aggravates the symptoms. Nothing relieves the symptoms. He has tried nothing for the symptoms. The treatment provided no relief.    Past Medical History:  Diagnosis Date  . Diabetes mellitus without complication (HCC)   . Hyperlipidemia   . Hypertension     Patient Active Problem List   Diagnosis Date Noted  . Osteomyelitis (HCC) 09/03/2017  . Class 2 obesity due to excess calories with serious comorbidity and body mass index (BMI) of 37.0 to 37.9 in adult 04/23/2016  . Type II diabetes mellitus, uncontrolled (HCC) 01/16/2016  . Mixed hyperlipidemia 01/16/2016  . Essential hypertension, benign 01/16/2016  . TENDINITIS, LEFT KNEE 09/12/2009  . KNEE, ARTHRITIS, DEGEN./OSTEO 06/14/2009  . KNEE PAIN 06/14/2009    Past Surgical History:  Procedure Laterality Date  . right leg surgery          Home Medications    Prior to Admission medications   Medication Sig Start Date End Date Taking? Authorizing Provider  allopurinol (ZYLOPRIM) 100 MG tablet Take 100 mg by mouth daily.    [provider]  aspirin 81 MG chewable tablet Chew by mouth daily.    [provider]  ibuprofen (ADVIL,MOTRIN) 800 MG tablet Take 800 mg by mouth every 8 (eight) hours as needed.    [provider]  JARDIANCE 25 MG TABS tablet TAKE 1 TABLET BY  MOUTH EVERY MORNING. 05/30/17   Nida, Denman George, MD  lisinopril-hydrochlorothiazide (PRINZIDE,ZESTORETIC) 20-25 MG per tablet Take 1 tablet by mouth daily.    [provider]  naproxen (NAPROSYN) 500 MG tablet Take 500 mg by mouth 2 (two) times daily with a meal.    [provider]  pravastatin (PRAVACHOL) 40 MG tablet TAKE (1) TABLET BY MOUTH ONCE DAILY. 01/06/17   Roma Kayser, MD  traMADol-acetaminophen (ULTRACET) 37.5-325 MG tablet Take 1 tablet by mouth every 6 (six) hours as needed.    [provider]  VICTOZA 18 MG/3ML SOPN INJECT 1.8 MG SUBQ EVERY DAY. 01/06/17   Roma Kayser, MD    Family History History reviewed. No pertinent family history.  Social History Social History  Substance Use Topics  . Smoking status: Never Smoker  . Smokeless tobacco: Never Used  . Alcohol use No     Allergies   Codeine   Review of Systems Review of Systems  Constitutional: Negative for appetite change and fatigue.  HENT: Negative for congestion, ear discharge and sinus pressure.   Eyes: Negative for discharge.  Respiratory: Negative for cough.   Cardiovascular: Positive for chest pain.  Gastrointestinal: Negative for abdominal pain and diarrhea.  Genitourinary: Negative for frequency and hematuria.  Musculoskeletal: Negative for back pain.       Left foot pain and swelling  Skin: Negative for rash.  Neurological: Negative for seizures and headaches.  Psychiatric/Behavioral: Negative for  hallucinations.     Physical Exam Updated Vital Signs BP (!) 152/75   Pulse 86   Temp 98.7 F (37.1 C) (Oral)   Resp 15   Ht 5' 9.5" (1.765 m)   Wt 113.4 kg (250 lb)   SpO2 99%   BMI 36.39 kg/m   Physical Exam  Constitutional: He is oriented to person, place, and time. He appears well-developed.  HENT:  Head: Normocephalic.  Eyes: Conjunctivae and EOM are normal. No scleral icterus.  Neck: Neck supple. No thyromegaly present.    Cardiovascular: Normal rate and regular rhythm.  Exam reveals no gallop and no friction rub.   No murmur heard. Pulmonary/Chest: No stridor. He has no wheezes. He has no rales. He exhibits no tenderness.  Abdominal: He exhibits no distension. There is no tenderness. There is no rebound.  Musculoskeletal: Normal range of motion. He exhibits edema.  Tender swollen left great toe  Lymphadenopathy:    He has no cervical adenopathy.  Neurological: He is oriented to person, place, and time. He exhibits normal muscle tone. Coordination normal.  Skin: No rash noted. No erythema.  Psychiatric: He has a normal mood and affect. His behavior is normal.     ED Treatments / Results  Labs (all labs ordered are listed, but only abnormal results are displayed) Labs Reviewed  BASIC METABOLIC PANEL - Abnormal; Notable for the following:       Result Value   Chloride 99 (*)    Glucose, Bld 150 (*)    All other components within normal limits  CBC - Abnormal; Notable for the following:    WBC 11.2 (*)    RBC 4.11 (*)    Hemoglobin 11.6 (*)    HCT 35.8 (*)    Platelets 415 (*)    All other components within normal limits  DIFFERENTIAL - Abnormal; Notable for the following:    Neutro Abs 8.1 (*)    Monocytes Absolute 1.2 (*)    All other components within normal limits  TROPONIN I  HEPATIC FUNCTION PANEL    EKG  EKG Interpretation  Date/Time:  Wednesday September 03 2017 07:16:25 EDT Ventricular Rate:  96 PR Interval:    QRS Duration: 84 QT Interval:  323 QTC Calculation: 409 R Axis:   24 Text Interpretation:  Sinus rhythm Confirmed by Bethann Berkshire [phone]) on 09/03/2017 9:18:33 AM       Radiology Dg Chest 2 View  Result Date: 09/03/2017 CLINICAL DATA:  61 year old male with chest pain and shortness of breath. Being treated for diabetic lower extremity ulcer. EXAM: CHEST  2 VIEW COMPARISON:  Report of [facility] CT Abdomen and Pelvis 01/12/2016 (no images available).  FINDINGS: Seated AP and lateral views of the chest. Cardiac size and mediastinal contours are within normal limits. Lung volumes are within normal limits. No pneumothorax, pulmonary edema, pleural effusion or confluent pulmonary opacity identified. No acute osseous abnormality identified. Negative visible bowel gas pattern. IMPRESSION: No acute cardiopulmonary abnormality. Electronically Signed   By: Odessa Fleming M.D.   On: 09/03/2017 08:36   Dg Foot Complete Left  Result Date: 09/03/2017 CLINICAL DATA:  Cellulitis, diabetes mellitus, hypertension EXAM: LEFT FOOT - COMPLETE 3+ VIEW COMPARISON:  None FINDINGS: Fragment of K-wire in the medial cuneiform. Mild osseous demineralization. Diffuse soft tissue swelling. Bone destruction at the distal phalanx of the great toe with comminuted mildly displaced fracture at the base, extending intra-articular at the IP joint. Findings are consistent with osteomyelitis with superimposed pathologic fracture  and septic arthritis of the IP joint great toe. Proximal phalanx of great toe appears intact. No additional fracture or dislocation. Small plantar calcaneal spur. Talonavicular degenerative changes with talar beaking and widening of the talonavicular joint. Pes planus. IMPRESSION: Osteomyelitis of the distal phalanx LEFT great toe with pathologic intra-articular fracture at the base of the proximal phalanx extending into the IP joint consistent with septic arthritis. Degenerative changes at talonavicular joint. Electronically Signed   By: Ulyses SouthwardMark  Boles M.D.   On: 09/03/2017 08:39    Procedures Procedures (including critical care time)  Medications Ordered in ED Medications  vancomycin (VANCOCIN) IVPB 1000 mg/200 mL premix (1,000 mg Intravenous New Bag/Given 09/03/17 0839)  piperacillin-tazobactam (ZOSYN) IVPB 3.375 g (0 g Intravenous Stopped 09/03/17 [id number])     Initial Impression / Assessment and Plan / ED Course  I have reviewed the triage vital signs and the nursing  notes.  Pertinent labs & imaging results that were available during my care of the patient were reviewed by me and considered in my medical decision making (see chart for details).     Cellulitis and osteo of left great toe  Final Clinical Impressions(s) / ED Diagnoses   Final diagnoses:  Other acute osteomyelitis of left foot [facility](HCC)    New Prescriptions New Prescriptions   No medications on file     Bethann BerkshireZammit, Robey Massmann, MD 09/03/17 [phone]

## 2017-09-03 NOTE — Progress Notes (Addendum)
Pharmacy Antibiotic Note  Calvin Shields is a 61 y.o. male admitted on 09/03/2017 with osteomyelitis.  Pharmacy has been consulted for Vancomycin and zosyn dosing.  Plan: Vancomycin 1gm given in ED this AM, give additional 1500mg  IV now, then 750mg  IV every 12 hours.  Goal trough 15-20 mcg/mL. Zosyn 3.375g IV q8h (4 hour infusion).  F/Ucxs and clinical progress Monitor V/S, Labs, and levels as indicated  Height: 5\' 10"  (177.8 cm) Weight: 248 lb 9.6 oz (112.8 kg) IBW/kg (Calculated) : 73  Temp (24hrs), Avg:98.9 F (37.2 C), Min:98.7 F (37.1 C), Max:99.1 F (37.3 C)   Recent Labs Lab 09/03/17 0727  WBC 11.2*  CREATININE 1.17    Normalized CrCl is 7866mls/min Estimated Creatinine Clearance: 82.3 mL/min (by C-G formula based on SCr of 1.17 mg/dL).    Allergies  Allergen Reactions  . Codeine Nausea And Vomiting    Antimicrobials this admission: Vancomycin 9/5 >>  Zosyn 9/5 >>   Dose adjustments this admission: N/A  Thank you for allowing pharmacy to be a part of this patient's care.  Elder CyphersLorie Rahshawn Remo, BS Loura Backharm D, [place] Clinical Pharmacist Pager [phone] 09/03/2017 6:43 PM

## 2017-09-03 NOTE — H&P (Signed)
History and Physical    Calvin Shields ZOX:[id number] DOB: 08/20/1956 DOA: 09/03/2017  Referring MD/NP/PA: Bethann BerkshireJoseph Zammit, EDP PCP: Louie Bostonapper, David B., MD  Patient coming from: Home  Chief Complaint: Left big toe pain and swelling  HPI: Calvin LargeSylvester Lovern is a 61 y.o. male with history of diabetes, hypertension, hyperlipidemia who presents to the hospital today with about a one-week history of progressive left big toe edema and erythema. He states that earlier this year in May he had a small ulcer of that toe that was treated topically with wound care and antibiotics [place]. He denies any fevers or chills. Has not been on antibiotics in the past 6 weeks. In the ED he is found to be nontoxic, afebrile. X-ray of the left foot shows osteomyelitis of the distal phalanx of the left great toe with pathologic intra-articular fracture at the base of the proximal phalanx extending into the IP joint consistent with septic arthritis. Admission has been requested.  Past Medical/Surgical History: Past Medical History:  Diagnosis Date  . Diabetes mellitus without complication (HCC)   . Hyperlipidemia   . Hypertension     Past Surgical History:  Procedure Laterality Date  . right leg surgery       Social History:  reports that he has never smoked. He has never used smokeless tobacco. He reports that he does not drink alcohol or use drugs.  Allergies: Allergies  Allergen Reactions  . Codeine Nausea And Vomiting    Family History:  Diabetes in multiple family members  Prior to Admission medications   Medication Sig Start Date End Date Taking? Authorizing Provider  allopurinol (ZYLOPRIM) 100 MG tablet Take 100 mg by mouth daily.   Yes [provider]  aspirin EC 81 MG tablet Take 81 mg by mouth daily.   Yes [provider]  ibuprofen (ADVIL,MOTRIN) 800 MG tablet Take 800 mg by mouth every 8 (eight) hours as needed.   Yes [provider]    JARDIANCE 25 MG TABS tablet TAKE 1 TABLET BY MOUTH EVERY MORNING. 05/30/17  Yes Nida, Denman GeorgeGebreselassie W, MD  lisinopril-hydrochlorothiazide (PRINZIDE,ZESTORETIC) 20-25 MG per tablet Take 1 tablet by mouth daily.   Yes [provider]  Multiple Vitamin (MULTIVITAMIN WITH MINERALS) TABS tablet Take 1 tablet by mouth daily.   Yes [provider]  naproxen (NAPROSYN) 500 MG tablet Take 500 mg by mouth 2 (two) times daily with a meal.   Yes [provider]  pravastatin (PRAVACHOL) 40 MG tablet TAKE (1) TABLET BY MOUTH ONCE DAILY. 01/06/17  Yes Nida, Denman GeorgeGebreselassie W, MD  VICTOZA 18 MG/3ML SOPN INJECT 1.8 MG SUBQ EVERY DAY. 01/06/17  Yes Roma KayserNida, Gebreselassie W, MD    Review of Systems:  Constitutional: Denies fever, chills, diaphoresis, appetite change and fatigue.  HEENT: Denies photophobia, eye pain, redness, hearing loss, ear pain, congestion, sore throat, rhinorrhea, sneezing, mouth sores, trouble swallowing, neck pain, neck stiffness and tinnitus.   Respiratory: Denies SOB, DOE, cough, chest tightness,  and wheezing.   Cardiovascular: Denies chest pain, palpitations and leg swelling.  Gastrointestinal: Denies nausea, vomiting, abdominal pain, diarrhea, constipation, blood in stool and abdominal distention.  Genitourinary: Denies dysuria, urgency, frequency, hematuria, flank pain and difficulty urinating.  Endocrine: Denies: hot or cold intolerance, sweats, changes in hair or nails, polyuria, polydipsia. Musculoskeletal: Denies myalgias, back pain, joint swelling, arthralgias and gait problem.  Skin: Denies pallor, rash and wound.  Neurological: Denies dizziness, seizures, syncope, weakness, light-headedness, numbness and headaches.  Hematological:  Denies adenopathy. Easy bruising, personal or family bleeding history  Psychiatric/Behavioral: Denies suicidal ideation, mood changes, confusion, nervousness, sleep disturbance and agitation    Physical Exam: Vitals:   09/03/17  0900 09/03/17 0930 09/03/17 1100 09/03/17 1351  BP: (!) 154/77 (!) 152/75 (!) 150/81 (!) 158/67  Pulse: 88 86 93 96  Resp: 18 15 (!) 23 20  Temp:    99.1 F (37.3 C)  TempSrc:    Oral  SpO2: 93% 99% 97% 98%  Weight:    112.8 kg (248 lb 9.6 oz)  Height:    5\' 10"  (1.778 m)     Constitutional: NAD, calm, comfortable Eyes: PERRL, lids and conjunctivae normal ENMT: Mucous membranes are moist. Posterior pharynx clear of any exudate or lesions.Normal dentition.  Neck: normal, supple, no masses, no thyromegaly Respiratory: clear to auscultation bilaterally, no wheezing, no crackles. Normal respiratory effort. No accessory muscle use.  Cardiovascular: Regular rate and rhythm, no murmurs / rubs / gallops. No extremity edema. 2+ pedal pulses. No carotid bruits.  Abdomen: no tenderness, no masses palpated. No hepatosplenomegaly. Bowel sounds positive.  Musculoskeletal: Left big toe edematous and erythematous particularly around the first MTP joint. ROM, no contractures. Normal muscle tone.  Skin: no rashes, lesions, ulcers. No induration Neurologic: CN 2-12 grossly intact. Sensation intact, DTR normal. Strength 5/5 in all 4.  Psychiatric: Normal judgment and insight. Alert and oriented x 3. Normal mood.    Labs on Admission: I have personally reviewed the following labs and imaging studies  CBC:  Recent Labs Lab 09/03/17 0727  WBC 11.2*  NEUTROABS 8.1*  HGB 11.6*  HCT 35.8*  MCV 87.1  PLT 415*   Basic Metabolic Panel:  Recent Labs Lab 09/03/17 0727  NA 135  K 3.9  CL 99*  CO2 26  GLUCOSE 150*  BUN 17  CREATININE 1.17  CALCIUM 8.9   GFR: Estimated Creatinine Clearance: 82.3 mL/min (by C-G formula based on SCr of 1.17 mg/dL). Liver Function Tests:  Recent Labs Lab 09/03/17 0727  AST 19  ALT 21  ALKPHOS 85  BILITOT 0.9  PROT 8.1  ALBUMIN 3.7   No results for input(s): LIPASE, AMYLASE in the last 168 hours. No results for input(s): AMMONIA in the last 168  hours. Coagulation Profile: No results for input(s): INR, PROTIME in the last 168 hours. Cardiac Enzymes:  Recent Labs Lab 09/03/17 0727  TROPONINI <0.03   BNP (last 3 results) No results for input(s): PROBNP in the last 8760 hours. HbA1C: No results for input(s): HGBA1C in the last 72 hours. CBG: No results for input(s): GLUCAP in the last 168 hours. Lipid Profile: No results for input(s): CHOL, HDL, LDLCALC, TRIG, CHOLHDL, LDLDIRECT in the last 72 hours. Thyroid Function Tests: No results for input(s): TSH, T4TOTAL, FREET4, T3FREE, THYROIDAB in the last 72 hours. Anemia Panel: No results for input(s): VITAMINB12, FOLATE, FERRITIN, TIBC, IRON, RETICCTPCT in the last 72 hours. Urine analysis: No results found for: COLORURINE, APPEARANCEUR, LABSPEC, PHURINE, GLUCOSEU, HGBUR, BILIRUBINUR, KETONESUR, PROTEINUR, UROBILINOGEN, NITRITE, LEUKOCYTESUR Sepsis Labs: @LABRCNTIP (procalcitonin:4,lacticidven:4) )No results found for this or any previous visit (from the past 240 hour(s)).   Radiological Exams on Admission: Dg Chest 2 View  Result Date: 09/03/2017 CLINICAL DATA:  61 year old male with chest pain and shortness of breath. Being treated for diabetic lower extremity ulcer. EXAM: CHEST  2 VIEW COMPARISON:  Report of [facility] CT Abdomen and Pelvis 01/12/2016 (no images available). FINDINGS: Seated AP and lateral views of the chest. Cardiac size  and mediastinal contours are within normal limits. Lung volumes are within normal limits. No pneumothorax, pulmonary edema, pleural effusion or confluent pulmonary opacity identified. No acute osseous abnormality identified. Negative visible bowel gas pattern. IMPRESSION: No acute cardiopulmonary abnormality. Electronically Signed   By: Odessa Fleming M.D.   On: 09/03/2017 08:36   Dg Foot Complete Left  Result Date: 09/03/2017 CLINICAL DATA:  Cellulitis, diabetes mellitus, hypertension EXAM: LEFT FOOT - COMPLETE 3+ VIEW COMPARISON:  None  FINDINGS: Fragment of K-wire in the medial cuneiform. Mild osseous demineralization. Diffuse soft tissue swelling. Bone destruction at the distal phalanx of the great toe with comminuted mildly displaced fracture at the base, extending intra-articular at the IP joint. Findings are consistent with osteomyelitis with superimposed pathologic fracture and septic arthritis of the IP joint great toe. Proximal phalanx of great toe appears intact. No additional fracture or dislocation. Small plantar calcaneal spur. Talonavicular degenerative changes with talar beaking and widening of the talonavicular joint. Pes planus. IMPRESSION: Osteomyelitis of the distal phalanx LEFT great toe with pathologic intra-articular fracture at the base of the proximal phalanx extending into the IP joint consistent with septic arthritis. Degenerative changes at talonavicular joint. Electronically Signed   By: Ulyses Southward M.D.   On: 09/03/2017 08:39    EKG: Independently reviewed. Normal sinus rhythm, no acute ischemic changes  Assessment/Plan Principal Problem:   Osteomyelitis (HCC) Active Problems:   Type II diabetes mellitus, uncontrolled (HCC)   Mixed hyperlipidemia   Essential hypertension, benign   Class 2 obesity due to excess calories with serious comorbidity and body mass index (BMI) of 37.0 to 37.9 in adult    Osteomyelitis/septic arthritis -X-ray is concerning for osteomyelitis of the left first distal phalanx and possibly septic arthritis of the left first proximal phalanx. -Given his history of diabetes feel it is important to cover for both MRSA and Pseudomonas hence will place on vancomycin and Zosyn. -Podiatry consultation has been requested as he would likely need amputation.  Type 2 diabetes -Check A1c place on sensitive sliding scale.  Hypertension -Well-controlled at present, continue home medications.  Hyperlipidemia -Continue statin   DVT prophylaxis: Lovenox  Code Status: Full code  Family  Communication: Wife and son at bedside updated on plan of care and questions answered  Disposition Plan: Pending surgical evaluation  Consults called: Podiatry  Admission status: Inpatient    Time Spent: 80 minutes   Chaya Jan MD [facility] Pager [phone]  If 7PM-7AM, please contact night-coverage [email or web address] Password [email or web address]  09/03/2017, 5:59 PM

## 2017-09-04 ENCOUNTER — Inpatient Hospital Stay (HOSPITAL_COMMUNITY): Payer: BLUE CROSS/BLUE SHIELD

## 2017-09-04 DIAGNOSIS — M86272 Subacute osteomyelitis, left ankle and foot: Secondary | ICD-10-CM

## 2017-09-04 DIAGNOSIS — M8600 Acute hematogenous osteomyelitis, unspecified site: Secondary | ICD-10-CM

## 2017-09-04 LAB — CBC
HCT: 33.9 % — ABNORMAL LOW (ref 39.0–52.0)
Hemoglobin: 11 g/dL — ABNORMAL LOW (ref 13.0–17.0)
MCH: 28.2 pg (ref 26.0–34.0)
MCHC: 32.4 g/dL (ref 30.0–36.0)
MCV: 86.9 fL (ref 78.0–100.0)
PLATELETS: 447 10*3/uL — AB (ref 150–400)
RBC: 3.9 MIL/uL — AB (ref 4.22–5.81)
RDW: 13.5 % (ref 11.5–15.5)
WBC: 10 10*3/uL (ref 4.0–10.5)

## 2017-09-04 LAB — BASIC METABOLIC PANEL
Anion gap: 8 (ref 5–15)
BUN: 14 mg/dL (ref 6–20)
CHLORIDE: 104 mmol/L (ref 101–111)
CO2: 27 mmol/L (ref 22–32)
CREATININE: 1.16 mg/dL (ref 0.61–1.24)
Calcium: 9 mg/dL (ref 8.9–10.3)
GFR calc Af Amer: >60 mL/min (ref >60)
GFR calc non Af Amer: >60 mL/min (ref >60)
Glucose, Bld: 112 mg/dL — ABNORMAL HIGH (ref 65–99)
Potassium: 3.5 mmol/L (ref 3.5–5.1)
Sodium: 139 mmol/L (ref 135–145)

## 2017-09-04 LAB — HEMOGLOBIN A1C
Hgb A1c MFr Bld: 6.6 % — ABNORMAL HIGH (ref 4.8–5.6)
Mean Plasma Glucose: 142.72 mg/dL

## 2017-09-04 LAB — GLUCOSE, CAPILLARY
GLUCOSE-CAPILLARY: 127 mg/dL — AB (ref 65–99)
GLUCOSE-CAPILLARY: 135 mg/dL — AB (ref 65–99)
Glucose-Capillary: 120 mg/dL — ABNORMAL HIGH (ref 65–99)
Glucose-Capillary: 148 mg/dL — ABNORMAL HIGH (ref 65–99)

## 2017-09-04 LAB — SURGICAL PCR SCREEN
MRSA, PCR: NEGATIVE
Staphylococcus aureus: POSITIVE — AB

## 2017-09-04 MED ORDER — LIDOCAINE HCL (PF) 1 % IJ SOLN
INTRAMUSCULAR | Status: AC
  Administered 2017-09-04: 5 mL
  Filled 2017-09-04: qty 5

## 2017-09-04 MED ORDER — GADOBENATE DIMEGLUMINE 529 MG/ML IV SOLN
20.0000 mL | Freq: Once | INTRAVENOUS | Status: AC | PRN
  Administered 2017-09-04: 20 mL via INTRAVENOUS

## 2017-09-04 NOTE — Consult Note (Signed)
I saw this patient at bedside along with his son and wife.   Patient is a 61 year old male with a history of diabetes mellitus. Patient has been seeing a podiatrist in [facility] for foot care. Patient apparently saw him 1 week ago and told me his foot doctor did procedure to clean out his toe. Two or three days after that, he started having swelling and redness in the left great toe. He also started having chest pain and came to the emergency room. He was admitted to the hospital for IV antibiotics.  Past Medical History:  Diagnosis Date  . Diabetes mellitus without complication ([place])   . Hyperlipidemia   . Hypertension          Past Surgical History:  Procedure Laterality Date  . right leg surgery       History reviewed. No pertinent family history.  Social History:  reports that he has never smoked. He has never used smokeless tobacco. He reports that he does not drink alcohol or use drugs.  Allergies:      Allergies  Allergen Reactions  . Codeine Nausea And Vomiting    Medications:  Scheduled: . aspirin EC  81 mg Oral Daily  . canagliflozin  100 mg Oral QAC breakfast  . enoxaparin (LOVENOX) injection  55 mg Subcutaneous Q24H  . lisinopril  20 mg Oral Daily   And  . hydrochlorothiazide  25 mg Oral Daily  . insulin aspart  0-15 Units Subcutaneous TID WC  . insulin aspart  0-5 Units Subcutaneous QHS  . insulin aspart  3 Units Subcutaneous TID WC  . multivitamin with minerals  1 tablet Oral Daily  . pravastatin  40 mg Oral q1800  . sodium chloride flush  3 mL Intravenous Q12H    Lab Results Last 48 Hours        Results for orders placed or performed during the hospital encounter of 09/03/17 (from the past 48 hour(s))  Basic metabolic panel     Status: Abnormal   Collection Time: 09/03/17  7:27 AM  Result Value Ref Range   Sodium 135 135 - 145 mmol/L   Potassium 3.9 3.5 - 5.1 mmol/L   Chloride 99 (L) 101 - 111 mmol/L   CO2 26 22 - 32  mmol/L   Glucose, Bld 150 (H) 65 - 99 mg/dL   BUN 17 6 - 20 mg/dL   Creatinine, Ser 1.17 0.61 - 1.24 mg/dL   Calcium 8.9 8.9 - 10.3 mg/dL   GFR calc non Af Amer >60 >60 mL/min   GFR calc Af Amer >60 >60 mL/min    Comment: (NOTE) The eGFR has been calculated using the CKD EPI equation. This calculation has not been validated in all clinical situations. eGFR's persistently <60 mL/min signify possible Chronic Kidney Disease.    Anion gap 10 5 - 15  CBC     Status: Abnormal   Collection Time: 09/03/17  7:27 AM  Result Value Ref Range   WBC 11.2 (H) 4.0 - 10.5 K/uL   RBC 4.11 (L) 4.22 - 5.81 MIL/uL   Hemoglobin 11.6 (L) 13.0 - 17.0 g/dL   HCT 35.8 (L) 39.0 - 52.0 %   MCV 87.1 78.0 - 100.0 fL   MCH 28.2 26.0 - 34.0 pg   MCHC 32.4 30.0 - 36.0 g/dL   RDW 13.5 11.5 - 15.5 %   Platelets 415 (H) 150 - 400 K/uL  Troponin I     Status: None   Collection Time:  09/03/17  7:27 AM  Result Value Ref Range   Troponin I <0.03 <0.03 ng/mL  Hepatic function panel     Status: None   Collection Time: 09/03/17  7:27 AM  Result Value Ref Range   Total Protein 8.1 6.5 - 8.1 g/dL   Albumin 3.7 3.5 - 5.0 g/dL   AST 19 15 - 41 U/L   ALT 21 17 - 63 U/L   Alkaline Phosphatase 85 38 - 126 U/L   Total Bilirubin 0.9 0.3 - 1.2 mg/dL   Bilirubin, Direct 0.2 0.1 - 0.5 mg/dL   Indirect Bilirubin 0.7 0.3 - 0.9 mg/dL  Differential     Status: Abnormal   Collection Time: 09/03/17  7:27 AM  Result Value Ref Range   Neutrophils Relative % 72 %   Neutro Abs 8.1 (H) 1.7 - 7.7 K/uL   Lymphocytes Relative 16 %   Lymphs Abs 1.8 0.7 - 4.0 K/uL   Monocytes Relative 11 %   Monocytes Absolute 1.2 (H) 0.1 - 1.0 K/uL   Eosinophils Relative 1 %   Eosinophils Absolute 0.2 0.0 - 0.7 K/uL   Basophils Relative 0 %   Basophils Absolute 0.0 0.0 - 0.1 K/uL  Glucose, capillary     Status: Abnormal   Collection Time: 09/03/17  9:08 PM  Result Value Ref Range   Glucose-Capillary  106 (H) 65 - 99 mg/dL  Basic metabolic panel     Status: Abnormal   Collection Time: 09/04/17  5:53 AM  Result Value Ref Range   Sodium 139 135 - 145 mmol/L   Potassium 3.5 3.5 - 5.1 mmol/L   Chloride 104 101 - 111 mmol/L   CO2 27 22 - 32 mmol/L   Glucose, Bld 112 (H) 65 - 99 mg/dL   BUN 14 6 - 20 mg/dL   Creatinine, Ser 1.16 0.61 - 1.24 mg/dL   Calcium 9.0 8.9 - 10.3 mg/dL   GFR calc non Af Amer >60 >60 mL/min   GFR calc Af Amer >60 >60 mL/min    Comment: (NOTE) The eGFR has been calculated using the CKD EPI equation. This calculation has not been validated in all clinical situations. eGFR's persistently <60 mL/min signify possible Chronic Kidney Disease.    Anion gap 8 5 - 15  CBC     Status: Abnormal   Collection Time: 09/04/17  5:53 AM  Result Value Ref Range   WBC 10.0 4.0 - 10.5 K/uL   RBC 3.90 (L) 4.22 - 5.81 MIL/uL   Hemoglobin 11.0 (L) 13.0 - 17.0 g/dL   HCT 33.9 (L) 39.0 - 52.0 %   MCV 86.9 78.0 - 100.0 fL   MCH 28.2 26.0 - 34.0 pg   MCHC 32.4 30.0 - 36.0 g/dL   RDW 13.5 11.5 - 15.5 %   Platelets 447 (H) 150 - 400 K/uL  Glucose, capillary     Status: Abnormal   Collection Time: 09/04/17  7:27 AM  Result Value Ref Range   Glucose-Capillary 120 (H) 65 - 99 mg/dL  Glucose, capillary     Status: Abnormal   Collection Time: 09/04/17 11:28 AM  Result Value Ref Range   Glucose-Capillary 127 (H) 65 - 99 mg/dL       Imaging Results (Last 48 hours)  Dg Chest 2 View  Result Date: 09/03/2017 CLINICAL DATA:  61 year old male with chest pain and shortness of breath. Being treated for diabetic lower extremity ulcer. EXAM: CHEST  2 VIEW COMPARISON:  Report of [facility]  CT Abdomen and Pelvis 01/12/2016 (no images available). FINDINGS: Seated AP and lateral views of the chest. Cardiac size and mediastinal contours are within normal limits. Lung volumes are within normal limits. No pneumothorax, pulmonary edema, pleural effusion or  confluent pulmonary opacity identified. No acute osseous abnormality identified. Negative visible bowel gas pattern. IMPRESSION: No acute cardiopulmonary abnormality. Electronically Signed   By: Genevie Ann M.D.   On: 09/03/2017 08:36   Dg Foot Complete Left  Result Date: 09/03/2017 CLINICAL DATA:  Cellulitis, diabetes mellitus, hypertension EXAM: LEFT FOOT - COMPLETE 3+ VIEW COMPARISON:  None FINDINGS: Fragment of K-wire in the medial cuneiform. Mild osseous demineralization. Diffuse soft tissue swelling. Bone destruction at the distal phalanx of the great toe with comminuted mildly displaced fracture at the base, extending intra-articular at the IP joint. Findings are consistent with osteomyelitis with superimposed pathologic fracture and septic arthritis of the IP joint great toe. Proximal phalanx of great toe appears intact. No additional fracture or dislocation. Small plantar calcaneal spur. Talonavicular degenerative changes with talar beaking and widening of the talonavicular joint. Pes planus. IMPRESSION: Osteomyelitis of the distal phalanx LEFT great toe with pathologic intra-articular fracture at the base of the proximal phalanx extending into the IP joint consistent with septic arthritis. Degenerative changes at talonavicular joint. Electronically Signed   By: Lavonia Dana M.D.   On: 09/03/2017 08:39     ROS:  Pertinent items are noted in HPI.  Blood pressure 137/62, pulse 84, temperature 98.5 F (36.9 C), temperature source Oral, resp. rate 18, height '5\' 10"'$  (1.778 m), weight 248 lb 9.6 oz (112.8 kg), SpO2 94 %.  Physical Exam: Pedal pulses are palpable. Left Hallux swollen, hot, red with hemorrhagic hyperkeratosis at the distal tip of the left hallux suggesting open wound underneath. Upon debridement there is full thickness ulcer, probing to bone. Purulent drainage noted from the wound. Erythema is extending from hallux to the MPJ level with skin tightness. Wound measured to be  0.5cmx0.5cmx1.4cm in depth. Nail of the left hallux is dystrophic.   I reviewed the radiographs and agree with findings.  Pending MRI results.   Assessment/Plan:  Cellulitis and abscess of the left great toe.  Ulcer of the left toe. Osteomyelitis of the distal phalanx of the left hallux.   Plan: I explained the patient and family in details about the condition of the toe and possible bone infection in the toe. Consent was signed for Incision and drainage and debridement of the ulcer at the bedside.  The area was injected with 5cc of 1% plain lidocaine. The left hallux was prepped with betadine. Upon debridment of the ulcer at the distal tip purulent drainage was drained. Wound culture were taken. Another dorsal stab incision was made. No purulent drainage was drained from dorsal aspect. Wounds were flushed and packed with Iodoform packing. Dry sterile dressing was applied. Continue the elevation of the left foot. Please reinforce the dressing if need to. Pending the read on the MRI. I discussed the case with Dr.Hernandez. Please order ESR, CRP and uric acid. I will reevaluate the patient tomorrow and decide whether he needs surgical intervention or not.   Thank you for the consult.   Tyson Babinski, DPM.

## 2017-09-04 NOTE — Progress Notes (Signed)
PROGRESS NOTE    Calvin Shields  ZOX:[id number] DOB: [birth year] DOA: 09/03/2017 PCP: Louie Bostonapper, David B., MD     Brief Narrative:  61 year old admitted to the hospital on 9/5 due to left pectoral pain and swelling with a plain film concerning for osteomyelitis of the distal phalanx of the left great toe and also possibly septic arthritis of the first MTP joint. Admission requested and podiatry/surgical consultation advised.   Assessment & Plan:   Principal Problem:   Osteomyelitis (HCC) Active Problems:   Type II diabetes mellitus, uncontrolled (HCC)   Mixed hyperlipidemia   Essential hypertension, benign   Class 2 obesity due to excess calories with serious comorbidity and body mass index (BMI) of 37.0 to 37.9 in adult   Osteomyelitis/septic arthritis of the left big toe -Continue broad-spectrum antibiotic therapy for now with vancomycin and Zosyn; given his history of diabetes I feel it is important to cover for both pseudomonas and MRSA. -MRI of forefoot is pending, if does have osteomyelitis will likely need surgical intervention. -Further recommendations pending MRI results and podiatry consultation.  Type 2 diabetes -Fair control, continue current regimen.  Hypertension -Fair control, continue current medications.  Hyperlipidemia -Continue statin   DVT prophylaxis: Lovenox Code Status: Full code Family Communication: Wife at bedside Disposition Plan: Pending MRI results  Consultants:   General surgery  Podiatry (pending)  Procedures:   None  Antimicrobials:  Anti-infectives    Start     Dose/Rate Route Frequency Ordered Stop   09/04/17 0700  vancomycin (VANCOCIN) IVPB 750 mg/150 ml premix     750 mg 150 mL/hr over 60 Minutes Intravenous Every 12 hours 09/03/17 1843     09/03/17 1900  vancomycin (VANCOCIN) 1,500 mg in sodium chloride 0.9 % 500 mL IVPB     1,500 mg 250 mL/hr over 120 Minutes Intravenous  Once 09/03/17 1843 09/03/17 2245   09/03/17 1830   piperacillin-tazobactam (ZOSYN) IVPB 3.375 g     3.375 g 12.5 mL/hr over 240 Minutes Intravenous Every 8 hours 09/03/17 1812     09/03/17 0730  vancomycin (VANCOCIN) IVPB 1000 mg/200 mL premix     1,000 mg 200 mL/hr over 60 Minutes Intravenous  Once 09/03/17 0726 09/03/17 0939   09/03/17 0730  piperacillin-tazobactam (ZOSYN) IVPB 3.375 g     3.375 g 100 mL/hr over 30 Minutes Intravenous  Once 09/03/17 0726 09/03/17 0838       Subjective: Feels well, still some mild pain of the left first MTP joint, concerned about amputation.  Objective: Vitals:   09/03/17 2105 09/04/17 0517 09/04/17 1116 09/04/17 1500  BP: (!) 149/79 137/62  (!) 146/75  Pulse: 88 84  97  Resp: 20 18  19   Temp: 99.1 F (37.3 C) 98.5 F (36.9 C)  98.9 F (37.2 C)  TempSrc: Oral Oral  Oral  SpO2: 99% 95% 94% 99%  Weight:      Height:        Intake/Output Summary (Last 24 hours) at 09/04/17 1634 Last data filed at 09/04/17 1500  Gross per 24 hour  Intake             1330 ml  Output             2050 ml  Net             -720 ml   Filed Weights   09/03/17 0717 09/03/17 1351  Weight: 113.4 kg (250 lb) 112.8 kg (248 lb 9.6 oz)    Examination:  General  exam: Alert, awake, oriented x 3 Respiratory system: Clear to auscultation. Respiratory effort normal. Cardiovascular system:RRR. No murmurs, rubs, gallops. Gastrointestinal system: Abdomen is nondistended, soft and nontender. No organomegaly or masses felt. Normal bowel sounds heard. Central nervous system: Alert and oriented. No focal neurological deficits. Extremities: Left big toe Edematous and erythematous particularly around the first MTP joint, positive pulses. Skin: No rashes, lesions or ulcers Psychiatry: Judgement and insight appear normal. Mood & affect appropriate.     Data Reviewed: I have personally reviewed following labs and imaging studies  CBC:  Recent Labs Lab 09/03/17 0727 09/04/17 0553  WBC 11.2* 10.0  NEUTROABS 8.1*  --     HGB 11.6* 11.0*  HCT 35.8* 33.9*  MCV 87.1 86.9  PLT 415* 447*   Basic Metabolic Panel:  Recent Labs Lab 09/03/17 0727 09/04/17 0553  NA 135 139  K 3.9 3.5  CL 99* 104  CO2 26 27  GLUCOSE 150* 112*  BUN 17 14  CREATININE 1.17 1.16  CALCIUM 8.9 9.0   GFR: Estimated Creatinine Clearance: 83 mL/min (by C-G formula based on SCr of 1.16 mg/dL). Liver Function Tests:  Recent Labs Lab 09/03/17 0727  AST 19  ALT 21  ALKPHOS 85  BILITOT 0.9  PROT 8.1  ALBUMIN 3.7   No results for input(s): LIPASE, AMYLASE in the last 168 hours. No results for input(s): AMMONIA in the last 168 hours. Coagulation Profile: No results for input(s): INR, PROTIME in the last 168 hours. Cardiac Enzymes:  Recent Labs Lab 09/03/17 0727  TROPONINI <0.03   BNP (last 3 results) No results for input(s): PROBNP in the last 8760 hours. HbA1C:  Recent Labs  09/03/17 0735  HGBA1C 6.6*   CBG:  Recent Labs Lab 09/03/17 2108 09/04/17 0727 09/04/17 1128 09/04/17 1602  GLUCAP 106* 120* 127* 135*   Lipid Profile: No results for input(s): CHOL, HDL, LDLCALC, TRIG, CHOLHDL, LDLDIRECT in the last 72 hours. Thyroid Function Tests: No results for input(s): TSH, T4TOTAL, FREET4, T3FREE, THYROIDAB in the last 72 hours. Anemia Panel: No results for input(s): VITAMINB12, FOLATE, FERRITIN, TIBC, IRON, RETICCTPCT in the last 72 hours. Urine analysis: No results found for: COLORURINE, APPEARANCEUR, LABSPEC, PHURINE, GLUCOSEU, HGBUR, BILIRUBINUR, KETONESUR, PROTEINUR, UROBILINOGEN, NITRITE, LEUKOCYTESUR Sepsis Labs: (procalcitonin:4,lacticidven:4)  )No results found for this or any previous visit (from the past 240 hour(s)).       Radiology Studies: Dg Chest 2 View  Result Date: 09/03/2017 CLINICAL DATA:  61 year old male with chest pain and shortness of breath. Being treated for diabetic lower extremity ulcer. EXAM: CHEST  2 VIEW COMPARISON:  Report of [facility] CT  Abdomen and Pelvis 01/12/2016 (no images available). FINDINGS: Seated AP and lateral views of the chest. Cardiac size and mediastinal contours are within normal limits. Lung volumes are within normal limits. No pneumothorax, pulmonary edema, pleural effusion or confluent pulmonary opacity identified. No acute osseous abnormality identified. Negative visible bowel gas pattern. IMPRESSION: No acute cardiopulmonary abnormality. Electronically Signed   By: Odessa Fleming M.D.   On: 09/03/2017 08:36   Dg Foot Complete Left  Result Date: 09/03/2017 CLINICAL DATA:  Cellulitis, diabetes mellitus, hypertension EXAM: LEFT FOOT - COMPLETE 3+ VIEW COMPARISON:  None FINDINGS: Fragment of K-wire in the medial cuneiform. Mild osseous demineralization. Diffuse soft tissue swelling. Bone destruction at the distal phalanx of the great toe with comminuted mildly displaced fracture at the base, extending intra-articular at the IP joint. Findings are consistent with osteomyelitis with superimposed pathologic fracture and septic  arthritis of the IP joint great toe. Proximal phalanx of great toe appears intact. No additional fracture or dislocation. Small plantar calcaneal spur. Talonavicular degenerative changes with talar beaking and widening of the talonavicular joint. Pes planus. IMPRESSION: Osteomyelitis of the distal phalanx LEFT great toe with pathologic intra-articular fracture at the base of the proximal phalanx extending into the IP joint consistent with septic arthritis. Degenerative changes at talonavicular joint. Electronically Signed   By: Ulyses Southward M.D.   On: 09/03/2017 08:39        Scheduled Meds: . aspirin EC  81 mg Oral Daily  . canagliflozin  100 mg Oral QAC breakfast  . enoxaparin (LOVENOX) injection  55 mg Subcutaneous Q24H  . lisinopril  20 mg Oral Daily   And  . hydrochlorothiazide  25 mg Oral Daily  . insulin aspart  0-15 Units Subcutaneous TID WC  . insulin aspart  0-5 Units Subcutaneous QHS  .  insulin aspart  3 Units Subcutaneous TID WC  . multivitamin with minerals  1 tablet Oral Daily  . pravastatin  40 mg Oral q1800  . sodium chloride flush  3 mL Intravenous Q12H   Continuous Infusions: . sodium chloride    . piperacillin-tazobactam (ZOSYN)  IV Stopped (09/04/17 1424)  . vancomycin Stopped (09/04/17 0704)     LOS: 1 day    Time spent: 25 minutes. Greater than 50% of this time was spent in direct contact with the patient coordinating care.     Chaya Jan, MD [facility] Pager [phone]  If 7PM-7AM, please contact night-coverage [email or web address] Password [facility] 09/04/2017, 4:34 PM

## 2017-09-04 NOTE — Consult Note (Signed)
Reason for Consult: Cellulitis, left great toe Referring Physician: Dr. Heron Nay [facility] is an 61 y.o. male.  HPI: Patient is a 61 year old black male with a history of diabetes mellitus who has been seeing a podiatrist in [place] for foot care. The patient apparently saw him 1 week ago. He did have a small ulceration on the left great toe. Several days ago, he started having swelling and redness in the left great toe. He does have some neuropathy. He has had diabetes for some time. He has had no drainage from the left great toe. He has had gout attacks in the past, but not involving the left great toe. He presented the emergency room and x-rays of the left great toe revealed some bony destruction in the left great toe. He was admitted to the hospital for IV antibiotics. He currently denies any significant pain in the left great toe.  Past Medical History:  Diagnosis Date  . Diabetes mellitus without complication ([place])   . Hyperlipidemia   . Hypertension     Past Surgical History:  Procedure Laterality Date  . right leg surgery       History reviewed. No pertinent family history.  Social History:  reports that he has never smoked. He has never used smokeless tobacco. He reports that he does not drink alcohol or use drugs.  Allergies:  Allergies  Allergen Reactions  . Codeine Nausea And Vomiting    Medications:  Scheduled: . aspirin EC  81 mg Oral Daily  . canagliflozin  100 mg Oral QAC breakfast  . enoxaparin (LOVENOX) injection  55 mg Subcutaneous Q24H  . lisinopril  20 mg Oral Daily   And  . hydrochlorothiazide  25 mg Oral Daily  . insulin aspart  0-15 Units Subcutaneous TID WC  . insulin aspart  0-5 Units Subcutaneous QHS  . insulin aspart  3 Units Subcutaneous TID WC  . multivitamin with minerals  1 tablet Oral Daily  . pravastatin  40 mg Oral q1800  . sodium chloride flush  3 mL Intravenous Q12H    Results for orders placed or performed during  the hospital encounter of 09/03/17 (from the past 48 hour(s))  Basic metabolic panel     Status: Abnormal   Collection Time: 09/03/17  7:27 AM  Result Value Ref Range   Sodium 135 135 - 145 mmol/L   Potassium 3.9 3.5 - 5.1 mmol/L   Chloride 99 (L) 101 - 111 mmol/L   CO2 26 22 - 32 mmol/L   Glucose, Bld 150 (H) 65 - 99 mg/dL   BUN 17 6 - 20 mg/dL   Creatinine, Ser 1.17 0.61 - 1.24 mg/dL   Calcium 8.9 8.9 - 10.3 mg/dL   GFR calc non Af Amer >60 >60 mL/min   GFR calc Af Amer >60 >60 mL/min    Comment: (NOTE) The eGFR has been calculated using the CKD EPI equation. This calculation has not been validated in all clinical situations. eGFR's persistently <60 mL/min signify possible Chronic Kidney Disease.    Anion gap 10 5 - 15  CBC     Status: Abnormal   Collection Time: 09/03/17  7:27 AM  Result Value Ref Range   WBC 11.2 (H) 4.0 - 10.5 K/uL   RBC 4.11 (L) 4.22 - 5.81 MIL/uL   Hemoglobin 11.6 (L) 13.0 - 17.0 g/dL   HCT 35.8 (L) 39.0 - 52.0 %   MCV 87.1 78.0 - 100.0 fL   MCH 28.2 26.0 -  34.0 pg   MCHC 32.4 30.0 - 36.0 g/dL   RDW 13.5 11.5 - 15.5 %   Platelets 415 (H) 150 - 400 K/uL  Troponin I     Status: None   Collection Time: 09/03/17  7:27 AM  Result Value Ref Range   Troponin I <0.03 <0.03 ng/mL  Hepatic function panel     Status: None   Collection Time: 09/03/17  7:27 AM  Result Value Ref Range   Total Protein 8.1 6.5 - 8.1 g/dL   Albumin 3.7 3.5 - 5.0 g/dL   AST 19 15 - 41 U/L   ALT 21 17 - 63 U/L   Alkaline Phosphatase 85 38 - 126 U/L   Total Bilirubin 0.9 0.3 - 1.2 mg/dL   Bilirubin, Direct 0.2 0.1 - 0.5 mg/dL   Indirect Bilirubin 0.7 0.3 - 0.9 mg/dL  Differential     Status: Abnormal   Collection Time: 09/03/17  7:27 AM  Result Value Ref Range   Neutrophils Relative % 72 %   Neutro Abs 8.1 (H) 1.7 - 7.7 K/uL   Lymphocytes Relative 16 %   Lymphs Abs 1.8 0.7 - 4.0 K/uL   Monocytes Relative 11 %   Monocytes Absolute 1.2 (H) 0.1 - 1.0 K/uL   Eosinophils  Relative 1 %   Eosinophils Absolute 0.2 0.0 - 0.7 K/uL   Basophils Relative 0 %   Basophils Absolute 0.0 0.0 - 0.1 K/uL  Glucose, capillary     Status: Abnormal   Collection Time: 09/03/17  9:08 PM  Result Value Ref Range   Glucose-Capillary 106 (H) 65 - 99 mg/dL  Basic metabolic panel     Status: Abnormal   Collection Time: 09/04/17  5:53 AM  Result Value Ref Range   Sodium 139 135 - 145 mmol/L   Potassium 3.5 3.5 - 5.1 mmol/L   Chloride 104 101 - 111 mmol/L   CO2 27 22 - 32 mmol/L   Glucose, Bld 112 (H) 65 - 99 mg/dL   BUN 14 6 - 20 mg/dL   Creatinine, Ser 1.16 0.61 - 1.24 mg/dL   Calcium 9.0 8.9 - 10.3 mg/dL   GFR calc non Af Amer >60 >60 mL/min   GFR calc Af Amer >60 >60 mL/min    Comment: (NOTE) The eGFR has been calculated using the CKD EPI equation. This calculation has not been validated in all clinical situations. eGFR's persistently <60 mL/min signify possible Chronic Kidney Disease.    Anion gap 8 5 - 15  CBC     Status: Abnormal   Collection Time: 09/04/17  5:53 AM  Result Value Ref Range   WBC 10.0 4.0 - 10.5 K/uL   RBC 3.90 (L) 4.22 - 5.81 MIL/uL   Hemoglobin 11.0 (L) 13.0 - 17.0 g/dL   HCT 33.9 (L) 39.0 - 52.0 %   MCV 86.9 78.0 - 100.0 fL   MCH 28.2 26.0 - 34.0 pg   MCHC 32.4 30.0 - 36.0 g/dL   RDW 13.5 11.5 - 15.5 %   Platelets 447 (H) 150 - 400 K/uL  Glucose, capillary     Status: Abnormal   Collection Time: 09/04/17  7:27 AM  Result Value Ref Range   Glucose-Capillary 120 (H) 65 - 99 mg/dL  Glucose, capillary     Status: Abnormal   Collection Time: 09/04/17 11:28 AM  Result Value Ref Range   Glucose-Capillary 127 (H) 65 - 99 mg/dL    Dg Chest 2 View  Result Date: 09/03/2017  CLINICAL DATA:  61 year old male with chest pain and shortness of breath. Being treated for diabetic lower extremity ulcer. EXAM: CHEST  2 VIEW COMPARISON:  Report of [facility] CT Abdomen and Pelvis 01/12/2016 (no images available). FINDINGS: Seated AP and lateral  views of the chest. Cardiac size and mediastinal contours are within normal limits. Lung volumes are within normal limits. No pneumothorax, pulmonary edema, pleural effusion or confluent pulmonary opacity identified. No acute osseous abnormality identified. Negative visible bowel gas pattern. IMPRESSION: No acute cardiopulmonary abnormality. Electronically Signed   By: Genevie Ann M.D.   On: 09/03/2017 08:36   Dg Foot Complete Left  Result Date: 09/03/2017 CLINICAL DATA:  Cellulitis, diabetes mellitus, hypertension EXAM: LEFT FOOT - COMPLETE 3+ VIEW COMPARISON:  None FINDINGS: Fragment of K-wire in the medial cuneiform. Mild osseous demineralization. Diffuse soft tissue swelling. Bone destruction at the distal phalanx of the great toe with comminuted mildly displaced fracture at the base, extending intra-articular at the IP joint. Findings are consistent with osteomyelitis with superimposed pathologic fracture and septic arthritis of the IP joint great toe. Proximal phalanx of great toe appears intact. No additional fracture or dislocation. Small plantar calcaneal spur. Talonavicular degenerative changes with talar beaking and widening of the talonavicular joint. Pes planus. IMPRESSION: Osteomyelitis of the distal phalanx LEFT great toe with pathologic intra-articular fracture at the base of the proximal phalanx extending into the IP joint consistent with septic arthritis. Degenerative changes at talonavicular joint. Electronically Signed   By: Lavonia Dana M.D.   On: 09/03/2017 08:39    ROS:  Pertinent items are noted in HPI.  Blood pressure 137/62, pulse 84, temperature 98.5 F (36.9 C), temperature source Oral, resp. rate 18, height '5\' 10"'  (1.778 m), weight 248 lb 9.6 oz (112.8 kg), SpO2 94 %. Physical Exam: Pleasant well-developed well-nourished black male in no acute distress Head is normocephalic, atraumatic Lungs clear auscultation with equal breath sounds bilaterally Heart examination reveals a  regular rate and rhythm without S3, S4, murmurs Extremity examination reveals bilateral femoral pulses. A palpable dorsalis pedis pulse is noted in the left foot. The left great toe is diffusely swollen and red. Degeneration of the nail is noted. No significant drainage is noted.  X-rays of left foot personally reviewed  Assessment/Plan: Impression: Cellulitis of left great toe, question gouty attack versus osteomyelitis Plan: Agree with proceeding with MRI to further assess. No need for acute surgical intervention as the patient has no leukocytosis. No ascending cellulitis is noted. Will follow with you.  Aviva Signs 09/04/2017, 12:51 PM

## 2017-09-05 ENCOUNTER — Other Ambulatory Visit: Payer: Self-pay | Admitting: Podiatry

## 2017-09-05 LAB — CBC
HCT: 37.9 % — ABNORMAL LOW (ref 39.0–52.0)
HEMOGLOBIN: 12.1 g/dL — AB (ref 13.0–17.0)
MCH: 27.5 pg (ref 26.0–34.0)
MCHC: 31.9 g/dL (ref 30.0–36.0)
MCV: 86.1 fL (ref 78.0–100.0)
Platelets: 592 10*3/uL — ABNORMAL HIGH (ref 150–400)
RBC: 4.4 MIL/uL (ref 4.22–5.81)
RDW: 13.5 % (ref 11.5–15.5)
WBC: 12.1 10*3/uL — ABNORMAL HIGH (ref 4.0–10.5)

## 2017-09-05 LAB — GLUCOSE, CAPILLARY
GLUCOSE-CAPILLARY: 178 mg/dL — AB (ref 65–99)
Glucose-Capillary: 143 mg/dL — ABNORMAL HIGH (ref 65–99)
Glucose-Capillary: 126 mg/dL — ABNORMAL HIGH (ref 65–99)
Glucose-Capillary: 184 mg/dL — ABNORMAL HIGH (ref 65–99)

## 2017-09-05 LAB — SEDIMENTATION RATE: Sed Rate: 90 mm/hr — ABNORMAL HIGH (ref 0–16)

## 2017-09-05 LAB — C-REACTIVE PROTEIN: CRP: 16.8 mg/dL — ABNORMAL HIGH (ref <1.0)

## 2017-09-05 LAB — HIV ANTIBODY (ROUTINE TESTING W REFLEX): HIV Screen 4th Generation wRfx: NONREACTIVE

## 2017-09-05 LAB — URIC ACID: Uric Acid, Serum: 4.8 mg/dL (ref 4.4–7.6)

## 2017-09-05 NOTE — Progress Notes (Signed)
S: I saw the patient today at bedside along wit his family. He is feeling lot better today. He feels the swelling is down and pain has reduced. He denies any calf pain or chest pain. Dressing has some strikethrough but is intact.   Physical Exam: Pedal pulses are palpable.Left hallux there is decrease in swelling and redness. No more purulent drainage noted. Incision on the dorsum of the hallux is noted to be 2cm long. There is full thickness ulcer at the distal tip of the left hallux which probes to bone. Ulcer is measured to be 0.5x0.5cmx 1.4cm in depth.  No purulent drainage noted today. Skin lines are more apparent today than yesterday. Capillary refill to the hallux is immediate.   MRI report: Consistent with osteomyelitis of the distal phalanx of left hallux. I personally reviewed the MRI and agree with the finding.   Wound culture showing MRSA.  ESR 90 CRP:16.8 Uric acid within normal limit.     A: Acute Osteomyelitis of the left distal phalanx of hallux.     2) Infected left foot ulcer and abscess.     3) Cellulitis of the left foot- Improving.   Plan: Patient was examined and evaluated in detail and explained him and his wife plan in layman's term.  I cleansed the wound with normal saline and reapplied iodoform packing and dry sterile dressing.  Patient will need partial toe amputation of the left hallux, under local anesthesia. Case is booked for Monday 12:00pm.  Please provide medical clearance for the patient.  Please keep him NPO Sunday midnight. Hold off on Lovenox.  I discussed my plan with Dr.Hernandez over the phone.  Continue IV antibiotics.  I explained him that in future he might need more surgery and further amputation if it does not heal. No guarantees were given to the patient. Patient understood and agreed to the plan.

## 2017-09-05 NOTE — Progress Notes (Signed)
PROGRESS NOTE    Calvin Shields  KZS:[id number] DOB: June 27, 1956 DOA: 09/03/2017 PCP: Louie Boston., MD     Brief Narrative:  61 year old admitted to the hospital on 9/5 due to left pectoral pain and swelling with a plain film concerning for osteomyelitis of the distal phalanx of the left great toe and also possibly septic arthritis of the first MTP joint. Admission requested and podiatry/surgical consultation advised.   Assessment & Plan:   Principal Problem:   Osteomyelitis (HCC) Active Problems:   Type II diabetes mellitus, uncontrolled (HCC)   Mixed hyperlipidemia   Essential hypertension, benign   Class 2 obesity due to excess calories with serious comorbidity and body mass index (BMI) of 37.0 to 37.9 in adult   Osteomyelitis/septic arthritis of the left big toe -Continue broad-spectrum antibiotic therapy for now with vancomycin and Zosyn; given his history of diabetes I feel it is important to cover for both pseudomonas and MRSA. -MRI of forefoot confirms osteomyelitis of the distal phalanx of the left great toe, suspect septic joint. Also shows cellulitis in the first and second toes tracking in the dorsum of the foot with mild myositis. -Podiatry performed bedside I and D on 9/6 with deep wound culture. -We'll likely need surgical intervention at the discretion of podiatry.  Type 2 diabetes -Fair control, continue current regimen.  Hypertension -Fair control, continue current medications.  Hyperlipidemia -Continue statin   DVT prophylaxis: Lovenox Code Status: Full code Family Communication: Patient only Disposition Plan: Pending podiatry recommendations  Consultants:   General surgery  Podiatry (pending)  Procedures:   None  Antimicrobials:  Anti-infectives    Start     Dose/Rate Route Frequency Ordered Stop   09/04/17 0700  vancomycin (VANCOCIN) IVPB 750 mg/150 ml premix     750 mg 150 mL/hr over 60 Minutes Intravenous Every 12 hours 09/03/17  1843     09/03/17 1900  vancomycin (VANCOCIN) 1,500 mg in sodium chloride 0.9 % 500 mL IVPB     1,500 mg 250 mL/hr over 120 Minutes Intravenous  Once 09/03/17 1843 09/03/17 2245   09/03/17 1830  piperacillin-tazobactam (ZOSYN) IVPB 3.375 g     3.375 g 12.5 mL/hr over 240 Minutes Intravenous Every 8 hours 09/03/17 1812     09/03/17 0730  vancomycin (VANCOCIN) IVPB 1000 mg/200 mL premix     1,000 mg 200 mL/hr over 60 Minutes Intravenous  Once 09/03/17 0726 09/03/17 0939   09/03/17 0730  piperacillin-tazobactam (ZOSYN) IVPB 3.375 g     3.375 g 100 mL/hr over 30 Minutes Intravenous  Once 09/03/17 0726 09/03/17 0838       Subjective: Feels well, no complaints, still anxious about degree of amputation  Objective: Vitals:   09/04/17 1956 09/04/17 2047 09/05/17 0540 09/05/17 1352  BP:  (!) 151/80 (!) 96/54 108/74  Pulse:  93 91 92  Resp:  Temp:  99.2 F (37.3 C) 98.1 F (36.7 C) 98.8 F (37.1 C)  TempSrc:  Oral Oral Oral  SpO2: 96% 100% 96% 98%  Weight:      Height:        Intake/Output Summary (Last 24 hours) at 09/05/17 1523 Last data filed at 09/05/17 0841  Gross per 24 hour  Intake             1200 ml  Output              900 ml  Net  300 ml   Filed Weights   09/03/17 0717 09/03/17 1351  Weight: 113.4 kg (250 lb) 112.8 kg (248 lb 9.6 oz)    Examination:  General exam: Alert, awake, oriented x 3 Respiratory system: Clear to auscultation. Respiratory effort normal. Cardiovascular system:RRR. No murmurs, rubs, gallops. Gastrointestinal system: Abdomen is nondistended, soft and nontender. No organomegaly or masses felt. Normal bowel sounds heard. Central nervous system: Alert and oriented. No focal neurological deficits. Extremities: Left big toe wrapped in clean gauze Skin: No rashes, lesions or ulcers Psychiatry: Judgement and insight appear normal. Mood & affect appropriate.      Data Reviewed: I have personally reviewed following labs  and imaging studies  CBC:  Recent Labs Lab 09/03/17 0727 09/04/17 0553 09/05/17 0604  WBC 11.2* 10.0 12.1*  NEUTROABS 8.1*  --   --   HGB 11.6* 11.0* 12.1*  HCT 35.8* 33.9* 37.9*  MCV 87.1 86.9 86.1  PLT 415* 447* 592*   Basic Metabolic Panel:  Recent Labs Lab 09/03/17 0727 09/04/17 0553  NA 135 139  K 3.9 3.5  CL 99* 104  CO2 26 27  GLUCOSE 150* 112*  BUN 17 14  CREATININE 1.17 1.16  CALCIUM 8.9 9.0   GFR: Estimated Creatinine Clearance: 83 mL/min (by C-G formula based on SCr of 1.16 mg/dL). Liver Function Tests:  Recent Labs Lab 09/03/17 0727  AST 19  ALT 21  ALKPHOS 85  BILITOT 0.9  PROT 8.1  ALBUMIN 3.7   No results for input(s): LIPASE, AMYLASE in the last 168 hours. No results for input(s): AMMONIA in the last 168 hours. Coagulation Profile: No results for input(s): INR, PROTIME in the last 168 hours. Cardiac Enzymes:  Recent Labs Lab 09/03/17 0727  TROPONINI <0.03   BNP (last 3 results) No results for input(s): PROBNP in the last 8760 hours. HbA1C:  Recent Labs  09/03/17 0735  HGBA1C 6.6*   CBG:  Recent Labs Lab 09/04/17 1128 09/04/17 1602 09/04/17 2046 09/05/17 0728 09/05/17 1129  GLUCAP 127* 135* 148* 143* 126*   Lipid Profile: No results for input(s): CHOL, HDL, LDLCALC, TRIG, CHOLHDL, LDLDIRECT in the last 72 hours. Thyroid Function Tests: No results for input(s): TSH, T4TOTAL, FREET4, T3FREE, THYROIDAB in the last 72 hours. Anemia Panel: No results for input(s): VITAMINB12, FOLATE, FERRITIN, TIBC, IRON, RETICCTPCT in the last 72 hours. Urine analysis: No results found for: COLORURINE, APPEARANCEUR, LABSPEC, PHURINE, GLUCOSEU, HGBUR, BILIRUBINUR, KETONESUR, PROTEINUR, UROBILINOGEN, NITRITE, LEUKOCYTESUR Sepsis Labs: (procalcitonin:4,lacticidven:4)  ) Recent Results (from the past 240 hour(s))  Surgical pcr screen     Status: Abnormal   Collection Time: 09/04/17  6:44 PM  Result Value Ref Range Status    MRSA, PCR NEGATIVE NEGATIVE Final   Staphylococcus aureus POSITIVE (A) NEGATIVE Final    Comment: RESULT CALLED TO, READ BACK BY AND VERIFIED WITH: LIGHTNER,N  BY MATTHEWS, B 9.6.18          Radiology Studies: Mr Foot Left W Wo Contrast  Result Date: 09/04/2017 CLINICAL DATA:  Left foot swelling. Diabetes. Concern for great toe osteomyelitis given findings on radiography. EXAM: MRI OF THE LEFT FOREFOOT WITHOUT AND WITH CONTRAST TECHNIQUE: Multiplanar, multisequence MR imaging of the left forefoot was performed both before and after administration of intravenous contrast. CONTRAST:  20mL MULTIHANCE GADOBENATE DIMEGLUMINE 529 MG/ML IV SOLN COMPARISON:  None. FINDINGS: Bones/Joint/Cartilage Mildly comminuted fracture the base the distal phalanx great toe with transverse component and a longitudinal component extending into the joint. Below this is an abnormal 2.7  by 2.5 by 1.1 cm region of hypoenhancement favoring tissue necrosis along the plantar foot. Abnormal 1.3 by 0.8 by 0.8 cm fluid collection around the tuft of the distal phalanx tracking to the distal tip of the great toe, probably a draining abscess or sinus tract draining from the distal phalanx. Extensive cellulitis in the great toe. Suspected septic arthritis of the interphalangeal joint of the great toe with complex fluid extending dorsal to the joint there is some reactive edema in the proximal phalanx great toe without well-defined osteomyelitis. No other osteomyelitis is identified in the forefoot. K-wire noted in the medial cuneiform. First digit sesamoids intact. Ligaments Lisfranc ligament intact Muscles and Tendons Low-level edema and enhancement along the plantar musculature of the foot, notably the adductor hallucis. Soft tissues Cellulitis tracks in the dorsum of the foot and into the first and second toes. IMPRESSION: 1. Osteomyelitis of the distal phalanx great toe with a draining fluid collection tracking around the tuft of  the distal phalanx to the distal tip of the toe, and hypoenhancing tissues plantar to the distal phalanx possibly from ulceration and tissue necrosis. Suspected septic interphalangeal joint. Reactive edema in the proximal phalanx without definite osteomyelitis of the proximal phalanx at this time. Known fracture the base of the distal phalanx great toe. 2. Cellulitis in the first and second toes tracking in the dorsum of the foot. Potential mild myositis with edema signal and mild enhancement tracking along the adductor hallucis muscle. Electronically Signed   By: Gaylyn RongWalter  Liebkemann M.D.   On: 09/04/2017 18:56        Scheduled Meds: . aspirin EC  81 mg Oral Daily  . canagliflozin  100 mg Oral QAC breakfast  . enoxaparin (LOVENOX) injection  55 mg Subcutaneous Q24H  . lisinopril  20 mg Oral Daily   And  . hydrochlorothiazide  25 mg Oral Daily  . insulin aspart  0-15 Units Subcutaneous TID WC  . insulin aspart  0-5 Units Subcutaneous QHS  . insulin aspart  3 Units Subcutaneous TID WC  . multivitamin with minerals  1 tablet Oral Daily  . pravastatin  40 mg Oral q1800  . sodium chloride flush  3 mL Intravenous Q12H   Continuous Infusions: . sodium chloride    . piperacillin-tazobactam (ZOSYN)  IV Stopped (09/05/17 1249)  . vancomycin Stopped (09/05/17 0730)     LOS: 2 days    Time spent: 25 minutes. Greater than 50% of this time was spent in direct contact with the patient coordinating care.     Chaya JanHERNANDEZ ACOSTA,Lorice Lafave, MD [facility] Pager [phone]  If 7PM-7AM, please contact night-coverage [email or web address] Password [email or web address] 09/05/2017, 3:23 PM

## 2017-09-05 NOTE — Progress Notes (Signed)
As patient is now under consultation by Podiatry, will sign off.  Call me if I can be of further assistance.

## 2017-09-06 DIAGNOSIS — M8609 Acute hematogenous osteomyelitis, multiple sites: Secondary | ICD-10-CM

## 2017-09-06 LAB — BASIC METABOLIC PANEL
Anion gap: 10 (ref 5–15)
BUN: 21 mg/dL — AB (ref 6–20)
CALCIUM: 9.3 mg/dL (ref 8.9–10.3)
CO2: 26 mmol/L (ref 22–32)
CREATININE: 1.34 mg/dL — AB (ref 0.61–1.24)
Chloride: 99 mmol/L — ABNORMAL LOW (ref 101–111)
GFR, EST NON AFRICAN AMERICAN: 55 mL/min — AB (ref >60)
Glucose, Bld: 129 mg/dL — ABNORMAL HIGH (ref 65–99)
Potassium: 3.7 mmol/L (ref 3.5–5.1)
SODIUM: 135 mmol/L (ref 135–145)

## 2017-09-06 LAB — GLUCOSE, CAPILLARY
GLUCOSE-CAPILLARY: 117 mg/dL — AB (ref 65–99)
GLUCOSE-CAPILLARY: 96 mg/dL (ref 65–99)
Glucose-Capillary: 156 mg/dL — ABNORMAL HIGH (ref 65–99)
Glucose-Capillary: 180 mg/dL — ABNORMAL HIGH (ref 65–99)

## 2017-09-06 LAB — CBC
HCT: 38.9 % — ABNORMAL LOW (ref 39.0–52.0)
Hemoglobin: 12.7 g/dL — ABNORMAL LOW (ref 13.0–17.0)
MCH: 28.2 pg (ref 26.0–34.0)
MCHC: 32.6 g/dL (ref 30.0–36.0)
MCV: 86.4 fL (ref 78.0–100.0)
PLATELETS: 624 10*3/uL — AB (ref 150–400)
RBC: 4.5 MIL/uL (ref 4.22–5.81)
RDW: 13.5 % (ref 11.5–15.5)
WBC: 9.7 10*3/uL (ref 4.0–10.5)

## 2017-09-06 MED ORDER — ALUM & MAG HYDROXIDE-SIMETH 200-200-20 MG/5ML PO SUSP: 30.0000 mL | ORAL | Status: DC | PRN

## 2017-09-06 NOTE — Progress Notes (Signed)
S: I saw the patient today at bedside. He states he is doing better. He denies any calf pain or chest pain. Dressing has some strikethrough but is intact.   Physical Exam: Pedal pulses are palpable.Left hallux there is still residual swelling and redness around the left hallux. No more purulent drainage noted. Incision on the dorsum of the hallux is noted to be 2cm long. There is full thickness ulcer at the distal tip of the left hallux which probes to bone. Ulcer is measured to be 0.2x0.5cmx 1.4cm in depth.  No purulent drainage noted today. Skin lines are more apparent today than yesterday. Capillary refill to the hallux is immediate.   MRI report: Consistent with osteomyelitis of the distal phalanx of left hallux. I personally reviewed the MRI and agree with the finding.   Wound culture showing MRSA.  ESR 90 CRP:16.8 Uric acid within normal limit.     A: Acute Osteomyelitis of the left distal phalanx of hallux.     2) Infected left foot ulcer and abscess.     3) Cellulitis of the left foot- Improving.   Plan: Patient was examined and evaluated in detail and explained him and his wife plan in layman's term.  I cleansed the wound with normal saline and reapplied iodoform packing and dry sterile dressing.  Patient will need partial toe amputation of the left hallux, under local anesthesia. Case is booked for Monday 12:00pm.  Please provide medical clearance for the patient.  Please keep him NPO Sunday midnight. Hold off on Lovenox.  I discussed my plan with Dr.Hernandez over the phone.  Continue IV antibiotics.  I explained him that in future he might need more surgery and further amputation if it does not heal. No guarantees were given to the patient. Patient understood and agreed to the plan.

## 2017-09-06 NOTE — Progress Notes (Signed)
PROGRESS NOTE    Calvin Shields  YNW:[id number] DOB: 1956/06/22 DOA: 09/03/2017 PCP: Louie Boston., MD     Brief Narrative:  61 year old admitted to the hospital on 9/5 due to left pectoral pain and swelling with a plain film concerning for osteomyelitis of the distal phalanx of the left great toe and also possibly septic arthritis of the first MTP joint. Admission requested and podiatry/surgical consultation advised.   Assessment & Plan:   Principal Problem:   Osteomyelitis (HCC) Active Problems:   Type II diabetes mellitus, uncontrolled (HCC)   Mixed hyperlipidemia   Essential hypertension, benign   Class 2 obesity due to excess calories with serious comorbidity and body mass index (BMI) of 37.0 to 37.9 in adult   Osteomyelitis/Cellulitis/septic arthritis of the left big toe -Continue broad-spectrum antibiotic therapy for now with vancomycin and Zosyn; given his history of diabetes I feel it is important to cover for both pseudomonas and MRSA. -MRI of forefoot confirms osteomyelitis of the distal phalanx of the left great toe, suspect septic joint. Also shows cellulitis in the first and second toes tracking in the dorsum of the foot with mild myositis. -Podiatry performed bedside I and D on 9/6 with deep wound culture. -Podiatry plans on taking patient to the OR on Monday for amputation of at least the distal part of the left hallux.  Type 2 diabetes -Fair control, continue current regimen.  Hypertension -Fair control, continue current medications.  Hyperlipidemia -Continue statin   DVT prophylaxis: Lovenox Code Status: Full code Family Communication: Patient only Disposition Plan: To OR on 9/10  Consultants:  Podiatry   Procedures:   None  Antimicrobials:  Anti-infectives    Start     Dose/Rate Route Frequency Ordered Stop   09/04/17 0700  vancomycin (VANCOCIN) IVPB 750 mg/150 ml premix     750 mg 150 mL/hr over 60 Minutes Intravenous Every 12 hours  09/03/17 1843     09/03/17 1900  vancomycin (VANCOCIN) 1,500 mg in sodium chloride 0.9 % 500 mL IVPB     1,500 mg 250 mL/hr over 120 Minutes Intravenous  Once 09/03/17 1843 09/03/17 2245   09/03/17 1830  piperacillin-tazobactam (ZOSYN) IVPB 3.375 g     3.375 g 12.5 mL/hr over 240 Minutes Intravenous Every 8 hours 09/03/17 1812     09/03/17 0730  vancomycin (VANCOCIN) IVPB 1000 mg/200 mL premix     1,000 mg 200 mL/hr over 60 Minutes Intravenous  Once 09/03/17 0726 09/03/17 0939   09/03/17 0730  piperacillin-tazobactam (ZOSYN) IVPB 3.375 g     3.375 g 100 mL/hr over 30 Minutes Intravenous  Once 09/03/17 0726 09/03/17 0838       Subjective: Feels well, has no specific complaints, a bit anxious about surgery scheduled for Monday  Objective: Vitals:   09/05/17 2052 09/05/17 2100 09/06/17 0500 09/06/17 1400  BP:  (!) 99/57 (!) 140/97 132/70  Pulse:  85 80 87  Resp:  Temp:  98.6 F (37 C) 98.4 F (36.9 C) 98.2 F (36.8 C)  TempSrc:  Oral Oral Oral  SpO2: 98% 97% 96% 98%  Weight:      Height:        Intake/Output Summary (Last 24 hours) at 09/06/17 1603 Last data filed at 09/06/17 0900  Gross per 24 hour  Intake              720 ml  Output              700  ml  Net               20 ml   Filed Weights   09/03/17 0717 09/03/17 1351  Weight: 113.4 kg (250 lb) 112.8 kg (248 lb 9.6 oz)    Examination:  General exam: Alert, awake, oriented x 3 Respiratory system: Clear to auscultation. Respiratory effort normal. Cardiovascular system:RRR. No murmurs, rubs, gallops. Gastrointestinal system: Abdomen is nondistended, soft and nontender. No organomegaly or masses felt. Normal bowel sounds heard. Central nervous system: Alert and oriented. No focal neurological deficits. Extremities: Clean dressing on left big toe. Skin: No rashes, lesions or ulcers Psychiatry: Judgement and insight appear normal. Mood & affect appropriate.       Data Reviewed: I have personally  reviewed following labs and imaging studies  CBC:  Recent Labs Lab 09/03/17 0727 09/04/17 0553 09/05/17 0604 09/06/17 0622  WBC 11.2* 10.0 12.1* 9.7  NEUTROABS 8.1*  --   --   --   HGB 11.6* 11.0* 12.1* 12.7*  HCT 35.8* 33.9* 37.9* 38.9*  MCV 87.1 86.9 86.1 86.4  PLT 415* 447* 592* 624*   Basic Metabolic Panel:  Recent Labs Lab 09/03/17 0727 09/04/17 0553 09/06/17 0622  NA 135 139 135  K 3.9 3.5 3.7  CL 99* 104 99*  CO2 GLUCOSE 150* 112* 129*  BUN 17 14 21*  CREATININE 1.17 1.16 1.34*  CALCIUM 8.9 9.0 9.3   GFR: Estimated Creatinine Clearance: 71.9 mL/min (A) (by C-G formula based on SCr of 1.34 mg/dL (H)). Liver Function Tests:  Recent Labs Lab 09/03/17 0727  AST 19  ALT 21  ALKPHOS 85  BILITOT 0.9  PROT 8.1  ALBUMIN 3.7   No results for input(s): LIPASE, AMYLASE in the last 168 hours. No results for input(s): AMMONIA in the last 168 hours. Coagulation Profile: No results for input(s): INR, PROTIME in the last 168 hours. Cardiac Enzymes:  Recent Labs Lab 09/03/17 0727  TROPONINI <0.03   BNP (last 3 results) No results for input(s): PROBNP in the last 8760 hours. HbA1C: No results for input(s): HGBA1C in the last 72 hours. CBG:  Recent Labs Lab 09/05/17 1129 09/05/17 1615 09/05/17 1946 09/06/17 0744 09/06/17 1137  GLUCAP 126* 184* 178* 156* 117*   Lipid Profile: No results for input(s): CHOL, HDL, LDLCALC, TRIG, CHOLHDL, LDLDIRECT in the last 72 hours. Thyroid Function Tests: No results for input(s): TSH, T4TOTAL, FREET4, T3FREE, THYROIDAB in the last 72 hours. Anemia Panel: No results for input(s): VITAMINB12, FOLATE, FERRITIN, TIBC, IRON, RETICCTPCT in the last 72 hours. Urine analysis: No results found for: COLORURINE, APPEARANCEUR, LABSPEC, PHURINE, GLUCOSEU, HGBUR, BILIRUBINUR, KETONESUR, PROTEINUR, UROBILINOGEN, NITRITE, LEUKOCYTESUR Sepsis Labs: (procalcitonin:4,lacticidven:4)  ) Recent Results (from the  past 240 hour(s))  Surgical pcr screen     Status: Abnormal   Collection Time: 09/04/17  6:44 PM  Result Value Ref Range Status   MRSA, PCR NEGATIVE NEGATIVE Final   Staphylococcus aureus POSITIVE (A) NEGATIVE Final    Comment: RESULT CALLED TO, READ BACK BY AND VERIFIED WITH: LIGHTNER,N  BY MATTHEWS, B 9.6.18          Radiology Studies: Mr Foot Left W Wo Contrast  Result Date: 09/04/2017 CLINICAL DATA:  Left foot swelling. Diabetes. Concern for great toe osteomyelitis given findings on radiography. EXAM: MRI OF THE LEFT FOREFOOT WITHOUT AND WITH CONTRAST TECHNIQUE: Multiplanar, multisequence MR imaging of the left forefoot was performed both before and after administration of intravenous contrast. CONTRAST:  20mL MULTIHANCE  GADOBENATE DIMEGLUMINE 529 MG/ML IV SOLN COMPARISON:  None. FINDINGS: Bones/Joint/Cartilage Mildly comminuted fracture the base the distal phalanx great toe with transverse component and a longitudinal component extending into the joint. Below this is an abnormal 2.7 by 2.5 by 1.1 cm region of hypoenhancement favoring tissue necrosis along the plantar foot. Abnormal 1.3 by 0.8 by 0.8 cm fluid collection around the tuft of the distal phalanx tracking to the distal tip of the great toe, probably a draining abscess or sinus tract draining from the distal phalanx. Extensive cellulitis in the great toe. Suspected septic arthritis of the interphalangeal joint of the great toe with complex fluid extending dorsal to the joint there is some reactive edema in the proximal phalanx great toe without well-defined osteomyelitis. No other osteomyelitis is identified in the forefoot. K-wire noted in the medial cuneiform. First digit sesamoids intact. Ligaments Lisfranc ligament intact Muscles and Tendons Low-level edema and enhancement along the plantar musculature of the foot, notably the adductor hallucis. Soft tissues Cellulitis tracks in the dorsum of the foot and into the first and  second toes. IMPRESSION: 1. Osteomyelitis of the distal phalanx great toe with a draining fluid collection tracking around the tuft of the distal phalanx to the distal tip of the toe, and hypoenhancing tissues plantar to the distal phalanx possibly from ulceration and tissue necrosis. Suspected septic interphalangeal joint. Reactive edema in the proximal phalanx without definite osteomyelitis of the proximal phalanx at this time. Known fracture the base of the distal phalanx great toe. 2. Cellulitis in the first and second toes tracking in the dorsum of the foot. Potential mild myositis with edema signal and mild enhancement tracking along the adductor hallucis muscle. Electronically Signed   By: Gaylyn RongWalter  Liebkemann M.D.   On: 09/04/2017 18:56        Scheduled Meds: . aspirin EC  81 mg Oral Daily  . canagliflozin  100 mg Oral QAC breakfast  . enoxaparin (LOVENOX) injection  55 mg Subcutaneous Q24H  . lisinopril  20 mg Oral Daily   And  . hydrochlorothiazide  25 mg Oral Daily  . insulin aspart  0-15 Units Subcutaneous TID WC  . insulin aspart  0-5 Units Subcutaneous QHS  . insulin aspart  3 Units Subcutaneous TID WC  . multivitamin with minerals  1 tablet Oral Daily  . pravastatin  40 mg Oral q1800  . sodium chloride flush  3 mL Intravenous Q12H   Continuous Infusions: . sodium chloride    . piperacillin-tazobactam (ZOSYN)  IV 3.375 g (09/06/17 1317)  . vancomycin Stopped (09/06/17 0753)     LOS: 3 days    Time spent: 25 minutes. Greater than 50% of this time was spent in direct contact with the patient coordinating care.     Chaya JanHERNANDEZ ACOSTA,ESTELA, MD [facility] Pager [phone]  If 7PM-7AM, please contact night-coverage [email or web address] Password [email or web address] 09/06/2017, 4:03 PM

## 2017-09-07 DIAGNOSIS — M86172 Other acute osteomyelitis, left ankle and foot: Secondary | ICD-10-CM

## 2017-09-07 LAB — CBC
HEMATOCRIT: 38.4 % — AB (ref 39.0–52.0)
Hemoglobin: 12.3 g/dL — ABNORMAL LOW (ref 13.0–17.0)
MCH: 27.8 pg (ref 26.0–34.0)
MCHC: 32 g/dL (ref 30.0–36.0)
MCV: 86.9 fL (ref 78.0–100.0)
Platelets: 649 10*3/uL — ABNORMAL HIGH (ref 150–400)
RBC: 4.42 MIL/uL (ref 4.22–5.81)
RDW: 13.5 % (ref 11.5–15.5)
WBC: 10.8 10*3/uL — ABNORMAL HIGH (ref 4.0–10.5)

## 2017-09-07 LAB — VANCOMYCIN, TROUGH: Vancomycin Tr: 16 ug/mL (ref 15–20)

## 2017-09-07 LAB — GLUCOSE, CAPILLARY
GLUCOSE-CAPILLARY: 287 mg/dL — AB (ref 65–99)
Glucose-Capillary: 135 mg/dL — ABNORMAL HIGH (ref 65–99)
Glucose-Capillary: 190 mg/dL — ABNORMAL HIGH (ref 65–99)
Glucose-Capillary: 107 mg/dL — ABNORMAL HIGH (ref 65–99)

## 2017-09-07 LAB — BASIC METABOLIC PANEL
Anion gap: 9 (ref 5–15)
BUN: 23 mg/dL — AB (ref 6–20)
CO2: 29 mmol/L (ref 22–32)
Calcium: 9.3 mg/dL (ref 8.9–10.3)
Chloride: 97 mmol/L — ABNORMAL LOW (ref 101–111)
Creatinine, Ser: 1.69 mg/dL — ABNORMAL HIGH (ref 0.61–1.24)
GFR calc Af Amer: 48 mL/min — ABNORMAL LOW (ref >60)
GFR calc non Af Amer: 42 mL/min — ABNORMAL LOW (ref >60)
GLUCOSE: 142 mg/dL — AB (ref 65–99)
POTASSIUM: 3.5 mmol/L (ref 3.5–5.1)
Sodium: 135 mmol/L (ref 135–145)

## 2017-09-07 MED ORDER — MUPIROCIN 2 % EX OINT
1.0000 "application " | TOPICAL_OINTMENT | Freq: Two times a day (BID) | CUTANEOUS | Status: DC
  Administered 2017-09-07 – 2017-09-08 (×3): 1 via NASAL
  Filled 2017-09-07: qty 22

## 2017-09-07 MED ORDER — CHLORHEXIDINE GLUCONATE CLOTH 2 % EX PADS
6.0000 | MEDICATED_PAD | Freq: Every day | CUTANEOUS | Status: DC
  Administered 2017-09-07 – 2017-09-09 (×3): 6 via TOPICAL

## 2017-09-07 NOTE — Progress Notes (Signed)
Pharmacy Antibiotic Note  Calvin Shields is a 61 y.o. male admitted on 09/03/2017 with osteomyelitis.  Pharmacy has been consulted for Vancomycin and zosyn dosing.  Trough level is on target.   Plan: Vancomycin 1gm given in ED this AM, give additional 1500mg  IV now, then 750mg  IV every 12 hours.  Goal trough 15-20 mcg/mL. Zosyn 3.375g IV q8h (4 hour infusion).  F/Ucxs and clinical progress Monitor V/S, Labs, and levels as indicated  Height: 5\' 10"  (177.8 cm) Weight: 247 lb 3.2 oz (112.1 kg) IBW/kg (Calculated) : 73  Temp (24hrs), Avg:98.2 F (36.8 C), Min:97.8 F (36.6 C), Max:98.5 F (36.9 C)   Recent Labs Lab 09/03/17 0727 09/04/17 0553 09/05/17 0604 09/06/17 0622 09/07/17 0642  WBC 11.2* 10.0 12.1* 9.7 10.8*  CREATININE 1.17 1.16  --  1.34* 1.69*  VANCOTROUGH  --   --   --   --  16    Normalized CrCl is 5466mls/min Estimated Creatinine Clearance: 56.8 mL/min (A) (by C-G formula based on SCr of 1.69 mg/dL (H)).    Allergies  Allergen Reactions  . Codeine Nausea And Vomiting   Antimicrobials this admission: Vancomycin 9/5 >>  Zosyn 9/5 >>   Dose adjustments this admission: N/A  Thank you for allowing pharmacy to be a part of this patient's care.  Valrie HartScott Elaine Roanhorse, PharmD Clinical Pharmacist Pager:  [phone] 09/07/2017   09/07/2017 8:08 AM

## 2017-09-07 NOTE — Progress Notes (Signed)
PROGRESS NOTE    Rally Ouch  GEX:[id number] DOB: 17-Jun-1956 DOA: 09/03/2017 PCP: Louie Boston., MD     Brief Narrative:  61 year old admitted to the hospital on 9/5 due to left pectoral pain and swelling with a plain film concerning for osteomyelitis of the distal phalanx of the left great toe and also possibly septic arthritis of the first MTP joint. Admission requested and podiatry/surgical consultation advised.   Assessment & Plan:   Principal Problem:   Osteomyelitis (HCC) Active Problems:   Type II diabetes mellitus, uncontrolled (HCC)   Mixed hyperlipidemia   Essential hypertension, benign   Class 2 obesity due to excess calories with serious comorbidity and body mass index (BMI) of 37.0 to 37.9 in adult   Osteomyelitis/Cellulitis/septic arthritis of the left big toe -Continue broad-spectrum antibiotic therapy for now with vancomycin and Zosyn per podiatry recommendations; given his history of diabetes I feel it is important to cover for both pseudomonas and MRSA. -MRI of forefoot confirms osteomyelitis of the distal phalanx of the left great toe, suspect septic joint. Also shows cellulitis in the first and second toes tracking in the dorsum of the foot with mild myositis. -Podiatry performed bedside I and D on 9/6 with deep wound culture. -Podiatry plans on taking patient to the OR on Monday for amputation of at least the distal part of the left hallux.  Type 2 diabetes -Fair control, continue current regimen.  Hypertension -Fair control, continue current medications.  Hyperlipidemia -Continue statin   DVT prophylaxis: Lovenox Code Status: Full code Family Communication: Patient only Disposition Plan: To OR on 9/10  Consultants:  Podiatry   Procedures:   None  Antimicrobials:  Anti-infectives    Start     Dose/Rate Route Frequency Ordered Stop   09/04/17 0700  vancomycin (VANCOCIN) IVPB 750 mg/150 ml premix     750 mg 150 mL/hr over 60 Minutes  Intravenous Every 12 hours 09/03/17 1843     09/03/17 1900  vancomycin (VANCOCIN) 1,500 mg in sodium chloride 0.9 % 500 mL IVPB     1,500 mg 250 mL/hr over 120 Minutes Intravenous  Once 09/03/17 1843 09/03/17 2245   09/03/17 1830  piperacillin-tazobactam (ZOSYN) IVPB 3.375 g     3.375 g 12.5 mL/hr over 240 Minutes Intravenous Every 8 hours 09/03/17 1812     09/03/17 0730  vancomycin (VANCOCIN) IVPB 1000 mg/200 mL premix     1,000 mg 200 mL/hr over 60 Minutes Intravenous  Once 09/03/17 0726 09/03/17 0939   09/03/17 0730  piperacillin-tazobactam (ZOSYN) IVPB 3.375 g     3.375 g 100 mL/hr over 30 Minutes Intravenous  Once 09/03/17 0726 09/03/17 0838       Subjective: Doing well, no complaints.  Objective: Vitals:   09/06/17 2145 09/07/17 0421 09/07/17 0905 09/07/17 1300  BP:  (!) 109/55 (!) 121/53 (!) 123/56  Pulse:  88  90  Resp:  16  18  Temp:  98.5 F (36.9 C)  98.6 F (37 C)  TempSrc:  Oral  Oral  SpO2: 94% 95%  97%  Weight:      Height:        Intake/Output Summary (Last 24 hours) at 09/07/17 1458 Last data filed at 09/07/17 1300  Gross per 24 hour  Intake             1443 ml  Output              200 ml  Net  1243 ml   Filed Weights   09/03/17 0717 09/03/17 1351 09/06/17 2052  Weight: 113.4 kg (250 lb) 112.8 kg (248 lb 9.6 oz) 112.1 kg (247 lb 3.2 oz)    Examination:  General exam: Alert, awake, oriented x 3 Respiratory system: Clear to auscultation. Respiratory effort normal. Cardiovascular system:RRR. No murmurs, rubs, gallops. Gastrointestinal system: Abdomen is nondistended, soft and nontender. No organomegaly or masses felt. Normal bowel sounds heard. Central nervous system: Alert and oriented. No focal neurological deficits. Extremities: Left big toe wrapped in clean dressing, positive pulses Skin: No rashes, lesions or ulcers Psychiatry: Judgement and insight appear normal. Mood & affect appropriate.        Data Reviewed: I have  personally reviewed following labs and imaging studies  CBC:  Recent Labs Lab 09/03/17 0727 09/04/17 0553 09/05/17 0604 09/06/17 0622 09/07/17 0642  WBC 11.2* 10.0 12.1* 9.7 10.8*  NEUTROABS 8.1*  --   --   --   --   HGB 11.6* 11.0* 12.1* 12.7* 12.3*  HCT 35.8* 33.9* 37.9* 38.9* 38.4*  MCV 87.1 86.9 86.1 86.4 86.9  PLT 415* 447* 592* 624* 649*   Basic Metabolic Panel:  Recent Labs Lab 09/03/17 0727 09/04/17 0553 09/06/17 0622 09/07/17 0642  NA 135 139 135 135  K 3.9 3.5 3.7 3.5  CL 99* 104 99* 97*  CO2 GLUCOSE 150* 112* 129* 142*  BUN 17 14 21* 23*  CREATININE 1.17 1.16 1.34* 1.69*  CALCIUM 8.9 9.0 9.3 9.3   GFR: Estimated Creatinine Clearance: 56.8 mL/min (A) (by C-G formula based on SCr of 1.69 mg/dL (H)). Liver Function Tests:  Recent Labs Lab 09/03/17 0727  AST 19  ALT 21  ALKPHOS 85  BILITOT 0.9  PROT 8.1  ALBUMIN 3.7   No results for input(s): LIPASE, AMYLASE in the last 168 hours. No results for input(s): AMMONIA in the last 168 hours. Coagulation Profile: No results for input(s): INR, PROTIME in the last 168 hours. Cardiac Enzymes:  Recent Labs Lab 09/03/17 0727  TROPONINI <0.03   BNP (last 3 results) No results for input(s): PROBNP in the last 8760 hours. HbA1C: No results for input(s): HGBA1C in the last 72 hours. CBG:  Recent Labs Lab 09/06/17 1137 09/06/17 1707 09/06/17 2104 09/07/17 0758 09/07/17 1134  GLUCAP 117* 96 180* 135* 190*   Lipid Profile: No results for input(s): CHOL, HDL, LDLCALC, TRIG, CHOLHDL, LDLDIRECT in the last 72 hours. Thyroid Function Tests: No results for input(s): TSH, T4TOTAL, FREET4, T3FREE, THYROIDAB in the last 72 hours. Anemia Panel: No results for input(s): VITAMINB12, FOLATE, FERRITIN, TIBC, IRON, RETICCTPCT in the last 72 hours. Urine analysis: No results found for: COLORURINE, APPEARANCEUR, LABSPEC, PHURINE, GLUCOSEU, HGBUR, BILIRUBINUR, KETONESUR, PROTEINUR, UROBILINOGEN,  NITRITE, LEUKOCYTESUR Sepsis Labs: (procalcitonin:4,lacticidven:4)  ) Recent Results (from the past 240 hour(s))  Surgical pcr screen     Status: Abnormal   Collection Time: 09/04/17  6:44 PM  Result Value Ref Range Status   MRSA, PCR NEGATIVE NEGATIVE Final   Staphylococcus aureus POSITIVE (A) NEGATIVE Final    Comment: RESULT CALLED TO, READ BACK BY AND VERIFIED WITH: LIGHTNER,N  BY MATTHEWS, B 9.6.18          Radiology Studies: No results found.      Scheduled Meds: . aspirin EC  81 mg Oral Daily  . canagliflozin  100 mg Oral QAC breakfast  . enoxaparin (LOVENOX) injection  55 mg Subcutaneous Q24H  . lisinopril  20 mg Oral  Daily   And  . hydrochlorothiazide  25 mg Oral Daily  . insulin aspart  0-15 Units Subcutaneous TID WC  . insulin aspart  0-5 Units Subcutaneous QHS  . insulin aspart  3 Units Subcutaneous TID WC  . multivitamin with minerals  1 tablet Oral Daily  . pravastatin  40 mg Oral q1800  . sodium chloride flush  3 mL Intravenous Q12H   Continuous Infusions: . sodium chloride    . piperacillin-tazobactam (ZOSYN)  IV 3.375 g (09/07/17 0903)  . vancomycin Stopped (09/06/17 1919)     LOS: 4 days    Time spent: 25 minutes. Greater than 50% of this time was spent in direct contact with the patient coordinating care.     Chaya JanHERNANDEZ ACOSTA,Abygale Karpf, MD [facility] Pager [phone]  If 7PM-7AM, please contact night-coverage [email or web address] Password [email or web address] 09/07/2017, 2:58 PM

## 2017-09-08 ENCOUNTER — Inpatient Hospital Stay (HOSPITAL_COMMUNITY): Payer: BLUE CROSS/BLUE SHIELD | Admitting: Anesthesiology

## 2017-09-08 ENCOUNTER — Encounter (HOSPITAL_COMMUNITY): Payer: Self-pay | Admitting: *Deleted

## 2017-09-08 ENCOUNTER — Encounter (HOSPITAL_COMMUNITY)
Admission: EM | Disposition: A | Discharge status: Home / Self Care | Payer: Self-pay | Source: Home / Self Care | Attending: Internal Medicine

## 2017-09-08 HISTORY — PX: AMPUTATION: SHX166

## 2017-09-08 LAB — BASIC METABOLIC PANEL
Anion gap: 11 (ref 5–15)
BUN: 25 mg/dL — ABNORMAL HIGH (ref 6–20)
CO2: 29 mmol/L (ref 22–32)
Calcium: 9.1 mg/dL (ref 8.9–10.3)
Chloride: 96 mmol/L — ABNORMAL LOW (ref 101–111)
Creatinine, Ser: 1.55 mg/dL — ABNORMAL HIGH (ref 0.61–1.24)
GFR, EST AFRICAN AMERICAN: 54 mL/min — AB (ref >60)
GFR, EST NON AFRICAN AMERICAN: 46 mL/min — AB (ref >60)
Glucose, Bld: 146 mg/dL — ABNORMAL HIGH (ref 65–99)
Potassium: 3.4 mmol/L — ABNORMAL LOW (ref 3.5–5.1)
SODIUM: 136 mmol/L (ref 135–145)

## 2017-09-08 LAB — GLUCOSE, CAPILLARY
GLUCOSE-CAPILLARY: 157 mg/dL — AB (ref 65–99)
GLUCOSE-CAPILLARY: 118 mg/dL — AB (ref 65–99)
GLUCOSE-CAPILLARY: 166 mg/dL — AB (ref 65–99)
Glucose-Capillary: 105 mg/dL — ABNORMAL HIGH (ref 65–99)
Glucose-Capillary: 105 mg/dL — ABNORMAL HIGH (ref 65–99)
Glucose-Capillary: 187 mg/dL — ABNORMAL HIGH (ref 65–99)

## 2017-09-08 SURGERY — AMPUTATION DIGIT
Anesthesia: Monitor Anesthesia Care | Laterality: Left

## 2017-09-08 MED ORDER — MIDAZOLAM HCL 2 MG/2ML IJ SOLN
1.0000 mg | INTRAMUSCULAR | Status: AC
  Administered 2017-09-08: 2 mg via INTRAVENOUS

## 2017-09-08 MED ORDER — FENTANYL CITRATE (PF) 100 MCG/2ML IJ SOLN
25.0000 ug | Freq: Once | INTRAMUSCULAR | Status: AC
  Administered 2017-09-08: 25 ug via INTRAVENOUS

## 2017-09-08 MED ORDER — MIDAZOLAM HCL 2 MG/2ML IJ SOLN
INTRAMUSCULAR | Status: AC
  Filled 2017-09-08: qty 2

## 2017-09-08 MED ORDER — BUPIVACAINE HCL 0.5 % IJ SOLN
INTRAMUSCULAR | Status: DC | PRN
  Administered 2017-09-08: 15 mL via INTRAMUSCULAR

## 2017-09-08 MED ORDER — MUSCLE RUB 10-15 % EX CREA
TOPICAL_CREAM | Freq: Two times a day (BID) | CUTANEOUS | Status: DC | PRN
  Administered 2017-09-08: 21:00:00 via TOPICAL
  Filled 2017-09-08 (×2): qty 85

## 2017-09-08 MED ORDER — 0.9 % SODIUM CHLORIDE (POUR BTL) OPTIME
TOPICAL | Status: DC | PRN
  Administered 2017-09-08: 1000 mL
  Administered 2017-09-08: 3000 mL

## 2017-09-08 MED ORDER — PROPOFOL 10 MG/ML IV BOLUS
INTRAVENOUS | Status: AC
  Filled 2017-09-08: qty 40

## 2017-09-08 MED ORDER — MIDAZOLAM HCL 5 MG/5ML IJ SOLN
INTRAMUSCULAR | Status: DC | PRN
  Administered 2017-09-08: 2 mg via INTRAVENOUS
  Administered 2017-09-08 (×2): 1 mg via INTRAVENOUS

## 2017-09-08 MED ORDER — LIDOCAINE HCL (PF) 1 % IJ SOLN
INTRAMUSCULAR | Status: AC
  Filled 2017-09-08: qty 30

## 2017-09-08 MED ORDER — POTASSIUM CHLORIDE CRYS ER 20 MEQ PO TBCR
30.0000 meq | EXTENDED_RELEASE_TABLET | Freq: Once | ORAL | Status: AC
  Administered 2017-09-08: 21:00:00 30 meq via ORAL
  Filled 2017-09-08: qty 1

## 2017-09-08 MED ORDER — LACTATED RINGERS IV SOLN
INTRAVENOUS | Status: DC | PRN
  Administered 2017-09-08: 14:00:00 via INTRAVENOUS

## 2017-09-08 MED ORDER — FENTANYL CITRATE (PF) 100 MCG/2ML IJ SOLN
INTRAMUSCULAR | Status: DC | PRN
  Administered 2017-09-08: 50 ug via INTRAVENOUS

## 2017-09-08 MED ORDER — MORPHINE SULFATE (PF) 2 MG/ML IV SOLN
2.0000 mg | INTRAVENOUS | Status: DC | PRN
  Administered 2017-09-09: 4 mg via INTRAVENOUS
  Administered 2017-09-09: 2 mg via INTRAVENOUS
  Filled 2017-09-08: qty 1
  Filled 2017-09-08: qty 2

## 2017-09-08 MED ORDER — FENTANYL CITRATE (PF) 100 MCG/2ML IJ SOLN: 25.0000 ug | INTRAMUSCULAR | Status: DC | PRN

## 2017-09-08 MED ORDER — FENTANYL CITRATE (PF) 100 MCG/2ML IJ SOLN
INTRAMUSCULAR | Status: AC
  Filled 2017-09-08: qty 2

## 2017-09-08 MED ORDER — BUPIVACAINE HCL (PF) 0.5 % IJ SOLN
INTRAMUSCULAR | Status: AC
  Filled 2017-09-08: qty 30

## 2017-09-08 MED ORDER — LACTATED RINGERS IV SOLN: INTRAVENOUS | Status: DC

## 2017-09-08 MED ORDER — PROPOFOL 500 MG/50ML IV EMUL
INTRAVENOUS | Status: DC | PRN
  Administered 2017-09-08: 75 ug/kg/min via INTRAVENOUS
  Administered 2017-09-08: 14:00:00 via INTRAVENOUS

## 2017-09-08 SURGICAL SUPPLY — 40 items
BAG HAMPER (MISCELLANEOUS) ×3 IMPLANT
BANDAGE ELASTIC 4 LF NS (GAUZE/BANDAGES/DRESSINGS) ×3 IMPLANT
BANDAGE ESMARK 4X12 BL STRL LF (DISPOSABLE) ×1 IMPLANT
BLADE AVERAGE 25MMX9MM (BLADE) ×1
BLADE AVERAGE 25X9 (BLADE) ×2 IMPLANT
BLADE SURG 15 STRL LF DISP TIS (BLADE) ×1 IMPLANT
BLADE SURG 15 STRL SS (BLADE) ×2
BNDG CONFORM 2 STRL LF (GAUZE/BANDAGES/DRESSINGS) ×3 IMPLANT
BNDG ESMARK 4X12 BLUE STRL LF (DISPOSABLE) ×3
CLOSURE WOUND 1/2 X4 (GAUZE/BANDAGES/DRESSINGS) ×1
CLOTH BEACON ORANGE TIMEOUT ST (SAFETY) ×3 IMPLANT
COVER LIGHT HANDLE STERIS (MISCELLANEOUS) ×3 IMPLANT
CUFF TOURNIQUET SINGLE 18IN (TOURNIQUET CUFF) ×3 IMPLANT
DECANTER SPIKE VIAL GLASS SM (MISCELLANEOUS) ×3 IMPLANT
DRSG ADAPTIC 3X8 NADH LF (GAUZE/BANDAGES/DRESSINGS) ×3 IMPLANT
ELECT REM PT RETURN 9FT ADLT (ELECTROSURGICAL) ×3
ELECTRODE REM PT RTRN 9FT ADLT (ELECTROSURGICAL) ×1 IMPLANT
GAUZE SPONGE 4X4 12PLY STRL (GAUZE/BANDAGES/DRESSINGS) ×3 IMPLANT
GLOVE BIO SURGEON STRL SZ7.5 (GLOVE) ×3 IMPLANT
GLOVE BIOGEL PI IND STRL 7.0 (GLOVE) ×2 IMPLANT
GLOVE BIOGEL PI INDICATOR 7.0 (GLOVE) ×4
GLOVE ECLIPSE 6.5 STRL STRAW (GLOVE) ×3 IMPLANT
GLOVE ECLIPSE 7.0 STRL STRAW (GLOVE) ×3 IMPLANT
GOWN STRL REUS W/ TWL XL LVL3 (GOWN DISPOSABLE) ×1 IMPLANT
GOWN STRL REUS W/TWL LRG LVL3 (GOWN DISPOSABLE) ×3 IMPLANT
GOWN STRL REUS W/TWL XL LVL3 (GOWN DISPOSABLE) ×2
HANDPIECE INTERPULSE COAX TIP (DISPOSABLE) ×2
IV NS IRRIG 3000ML ARTHROMATIC (IV SOLUTION) ×3 IMPLANT
KIT ROOM TURNOVER APOR (KITS) ×3 IMPLANT
MANIFOLD NEPTUNE II (INSTRUMENTS) ×3 IMPLANT
NEEDLE HYPO 27GX1-1/4 (NEEDLE) ×6 IMPLANT
NS IRRIG 1000ML POUR BTL (IV SOLUTION) ×3 IMPLANT
PACK BASIC LIMB (CUSTOM PROCEDURE TRAY) ×3 IMPLANT
PAD ARMBOARD 7.5X6 YLW CONV (MISCELLANEOUS) ×3 IMPLANT
SET BASIN LINEN APH (SET/KITS/TRAYS/PACK) ×3 IMPLANT
SET HNDPC FAN SPRY TIP SCT (DISPOSABLE) ×1 IMPLANT
STRIP CLOSURE SKIN 1/2X4 (GAUZE/BANDAGES/DRESSINGS) ×2 IMPLANT
SUT PROLENE 3 0 PS 1 (SUTURE) ×3 IMPLANT
SUT PROLENE 4 0 PS 2 18 (SUTURE) ×3 IMPLANT
SYR CONTROL 10ML LL (SYRINGE) ×3 IMPLANT

## 2017-09-08 NOTE — Progress Notes (Signed)
PROGRESS NOTE    Calvin Shields  UEA:[id number] DOB: Dec 04, 1956 DOA: 09/03/2017 PCP: Louie Bostonapper, David B., MD     Brief Narrative:  61 year old admitted to the hospital on 9/5 due to left pectoral pain and swelling with a plain film concerning for osteomyelitis of the distal phalanx of the left great toe and also possibly septic arthritis of the first MTP joint. Admission requested and podiatry/surgical consultation advised.   Assessment & Plan:   Principal Problem:   Osteomyelitis (HCC) Active Problems:   Type II diabetes mellitus, uncontrolled (HCC)   Mixed hyperlipidemia   Essential hypertension, benign   Class 2 obesity due to excess calories with serious comorbidity and body mass index (BMI) of 37.0 to 37.9 in adult   Osteomyelitis/Cellulitis/septic arthritis of the left big toe -Continue broad-spectrum antibiotic therapy for now with vancomycin and Zosyn per podiatry recommendations; given his history of diabetes I feel it is important to cover for both pseudomonas and MRSA. Cellulitis aspect has improved significantly on abx. -MRI of forefoot confirms osteomyelitis of the distal phalanx of the left great toe, suspect septic joint. Also shows cellulitis in the first and second toes tracking in the dorsum of the foot with mild myositis. -Podiatry performed bedside I and D on 9/6 with deep wound culture. -Podiatry plans on taking patient to the OR on Monday for amputation of at least the distal part of the left hallux.  Type 2 diabetes -Well controlled, continue current regimen.  Hypertension -Fair control, continue current medications.  Hyperlipidemia -Continue statin   DVT prophylaxis: Lovenox Code Status: Full code Family Communication: Patient only Disposition Plan: To OR today  Consultants:  Podiatry   Procedures:   None  Antimicrobials:  Anti-infectives    Start     Dose/Rate Route Frequency Ordered Stop   09/04/17 0700  [MAR Hold]  vancomycin (VANCOCIN)  IVPB 750 mg/150 ml premix     (MAR Hold since 09/08/17 1146)   750 mg 150 mL/hr over 60 Minutes Intravenous Every 12 hours 09/03/17 1843     09/03/17 1900  vancomycin (VANCOCIN) 1,500 mg in sodium chloride 0.9 % 500 mL IVPB     1,500 mg 250 mL/hr over 120 Minutes Intravenous  Once 09/03/17 1843 09/03/17 2245   09/03/17 1830  [MAR Hold]  piperacillin-tazobactam (ZOSYN) IVPB 3.375 g     (MAR Hold since 09/08/17 1146)   3.375 g 12.5 mL/hr over 240 Minutes Intravenous Every 8 hours 09/03/17 1812     09/03/17 0730  vancomycin (VANCOCIN) IVPB 1000 mg/200 mL premix     1,000 mg 200 mL/hr over 60 Minutes Intravenous  Once 09/03/17 0726 09/03/17 0939   09/03/17 0730  piperacillin-tazobactam (ZOSYN) IVPB 3.375 g     3.375 g 100 mL/hr over 30 Minutes Intravenous  Once 09/03/17 0726 09/03/17 0838       Subjective: Has no complaints. Pleasant and conversational.  Objective: Vitals:   09/08/17 1240 09/08/17 1245 09/08/17 1250 09/08/17 1255  BP: 112/68     Pulse:      Resp: (!) 35 (!) 42 (!) 63 (!) 26  Temp:      TempSrc:      SpO2: 100% 99% 98% 100%  Weight:      Height:        Intake/Output Summary (Last 24 hours) at 09/08/17 1422 Last data filed at 09/08/17 1405  Gross per 24 hour  Intake             1053 ml  Output  0 ml  Net             1053 ml   Filed Weights   09/03/17 0717 09/03/17 1351 09/06/17 2052  Weight: 113.4 kg (250 lb) 112.8 kg (248 lb 9.6 oz) 112.1 kg (247 lb 3.2 oz)    Examination:  General exam: Alert, awake, oriented x 3 Respiratory system: Clear to auscultation. Respiratory effort normal. Cardiovascular system:RRR. No murmurs, rubs, gallops. Gastrointestinal system: Abdomen is nondistended, soft and nontender. No organomegaly or masses felt. Normal bowel sounds heard. Central nervous system: Alert and oriented. No focal neurological deficits. Extremities: Left big toe wrapped in clean dressing. Surrounding edema has improved. Skin: No rashes,  lesions or ulcers Psychiatry: Judgement and insight appear normal. Mood & affect appropriate.          Data Reviewed: I have personally reviewed following labs and imaging studies  CBC:  Recent Labs Lab 09/03/17 0727 09/04/17 0553 09/05/17 0604 09/06/17 0622 09/07/17 0642  WBC 11.2* 10.0 12.1* 9.7 10.8*  NEUTROABS 8.1*  --   --   --   --   HGB 11.6* 11.0* 12.1* 12.7* 12.3*  HCT 35.8* 33.9* 37.9* 38.9* 38.4*  MCV 87.1 86.9 86.1 86.4 86.9  PLT 415* 447* 592* 624* 649*   Basic Metabolic Panel:  Recent Labs Lab 09/03/17 0727 09/04/17 0553 09/06/17 0622 09/07/17 0642 09/08/17 0509  NA 135 139 135 135 136  K 3.9 3.5 3.7 3.5 3.4*  CL 99* 104 99* 97* 96*  CO2 GLUCOSE 150* 112* 129* 142* 146*  BUN 17 14 21* 23* 25*  CREATININE 1.17 1.16 1.34* 1.69* 1.55*  CALCIUM 8.9 9.0 9.3 9.3 9.1   GFR: Estimated Creatinine Clearance: 61.9 mL/min (A) (by C-G formula based on SCr of 1.55 mg/dL (H)). Liver Function Tests:  Recent Labs Lab 09/03/17 0727  AST 19  ALT 21  ALKPHOS 85  BILITOT 0.9  PROT 8.1  ALBUMIN 3.7   No results for input(s): LIPASE, AMYLASE in the last 168 hours. No results for input(s): AMMONIA in the last 168 hours. Coagulation Profile: No results for input(s): INR, PROTIME in the last 168 hours. Cardiac Enzymes:  Recent Labs Lab 09/03/17 0727  TROPONINI <0.03   BNP (last 3 results) No results for input(s): PROBNP in the last 8760 hours. HbA1C: No results for input(s): HGBA1C in the last 72 hours. CBG:  Recent Labs Lab 09/07/17 2015 09/08/17 0725 09/08/17 1136 09/08/17 1155 09/08/17 1420  GLUCAP 287* 157* 105* 105* 118*   Lipid Profile: No results for input(s): CHOL, HDL, LDLCALC, TRIG, CHOLHDL, LDLDIRECT in the last 72 hours. Thyroid Function Tests: No results for input(s): TSH, T4TOTAL, FREET4, T3FREE, THYROIDAB in the last 72 hours. Anemia Panel: No results for input(s): VITAMINB12, FOLATE, FERRITIN, TIBC, IRON,  RETICCTPCT in the last 72 hours. Urine analysis: No results found for: COLORURINE, APPEARANCEUR, LABSPEC, PHURINE, GLUCOSEU, HGBUR, BILIRUBINUR, KETONESUR, PROTEINUR, UROBILINOGEN, NITRITE, LEUKOCYTESUR Sepsis Labs: (procalcitonin:4,lacticidven:4)  ) Recent Results (from the past 240 hour(s))  Surgical pcr screen     Status: Abnormal   Collection Time: 09/04/17  6:44 PM  Result Value Ref Range Status   MRSA, PCR NEGATIVE NEGATIVE Final   Staphylococcus aureus POSITIVE (A) NEGATIVE Final    Comment: RESULT CALLED TO, READ BACK BY AND VERIFIED WITH: LIGHTNER,N  BY MATTHEWS, B 9.6.18          Radiology Studies: No results found.      Scheduled Meds: . [MAR Hold] aspirin  EC  81 mg Oral Daily  . [MAR Hold] canagliflozin  100 mg Oral QAC breakfast  . [MAR Hold] Chlorhexidine Gluconate Cloth  6 each Topical Daily  . [MAR Hold] enoxaparin (LOVENOX) injection  55 mg Subcutaneous Q24H  . [MAR Hold] lisinopril  20 mg Oral Daily   And  . [MAR Hold] hydrochlorothiazide  25 mg Oral Daily  . [MAR Hold] insulin aspart  0-15 Units Subcutaneous TID WC  . [MAR Hold] insulin aspart  0-5 Units Subcutaneous QHS  . [MAR Hold] insulin aspart  3 Units Subcutaneous TID WC  . [MAR Hold] multivitamin with minerals  1 tablet Oral Daily  . [MAR Hold] mupirocin ointment  1 application Nasal BID  . [MAR Hold] pravastatin  40 mg Oral q1800  . [MAR Hold] sodium chloride flush  3 mL Intravenous Q12H   Continuous Infusions: . [MAR Hold] sodium chloride Stopped (09/08/17 1350)  . [MAR Hold] piperacillin-tazobactam (ZOSYN)  IV 3.375 g (09/08/17 1030)  . [MAR Hold] vancomycin Stopped (09/08/17 0607)     LOS: 5 days    Time spent: 25 minutes. Greater than 50% of this time was spent in direct contact with the patient coordinating care.     Chaya Jan, MD [facility] Pager [phone]  If 7PM-7AM, please contact night-coverage [email or web address] Password  [email or web address] 09/08/2017, 2:22 PM

## 2017-09-08 NOTE — Progress Notes (Signed)
Patient seen by me at the OR holding pre op area. Patient denies any new complaints.   A: Acute Osteomyelitis of the left distal phalanx of hallux.  2) Infected left foot ulcer and abscess.  3) Cellulitis of the left foot- Improving.   Plan: Patient was examined and evaluated in detail and explained him plan in layman's term.  I explained him that in future he might need more surgery and further amputation if it does not heal. No guarantees were given to the patient. Patient understood and agreed to the plan. Consent was signed for partial left hallux amputation.

## 2017-09-08 NOTE — Transfer of Care (Signed)
Immediate Anesthesia Transfer of Care Note  Patient: Calvin Shields  Procedure(s) Performed: Procedure(s): PARTIAL TOE AMPUTATION LEFT HALLUX (Left)  Patient Location: PACU  Anesthesia Type:MAC  Level of Consciousness: awake and patient cooperative  Airway & Oxygen Therapy: Patient Spontanous Breathing and Patient connected to face mask oxygen  Post-op Assessment: Report given to RN and Post -op Vital signs reviewed and stable  Post vital signs: Reviewed and stable  Last Vitals:  Vitals:   09/08/17 1250 09/08/17 1255  BP:    Pulse:    Resp: (!) 63 (!) 26  Temp:    SpO2: 98% 100%    Last Pain:  Vitals:   09/08/17 1206  TempSrc: Oral  PainSc:       Patients Stated Pain Goal: 4 (08/67/61 9509)  Complications: No apparent anesthesia complications

## 2017-09-08 NOTE — Brief Op Note (Signed)
09/03/2017 - 09/08/2017  5:10 PM  PATIENT:  Calvin Shields  61 y.o. male  PRE-OPERATIVE DIAGNOSIS:  osteomyelitis distal phalanx of left hallux and infected ulcer of left foot  POST-OPERATIVE DIAGNOSIS:  osteomyelitis distal phalanx of left hallux and infected ulcer of left foot  PROCEDURE:  Procedure(s): PARTIAL TOE AMPUTATION LEFT HALLUX (Left)  SURGEON:  Surgeon(s) and Role:    * Tyson Babinski, DPM - Primary  PHYSICIAN ASSISTANT:   ASSISTANTS: none   ANESTHESIA:   local and MAC  EBL:  Total I/O In: 600 [I.V.:600] Out: -   BLOOD ADMINISTERED:none  DRAINS: none   LOCAL MEDICATIONS USED:  MARCAINE   , LIDOCAINE  and Amount: 15 ml  SPECIMEN:  Source of Specimen:  left hallux bone  DISPOSITION OF SPECIMEN:  PATHOLOGY  COUNTS:  YES  TOURNIQUET:   Total Tourniquet Time Documented: Leg (Left) - 37 minutes Total: Leg (Left) - 37 minutes   DICTATION: .Viviann Spare Dictation  PLAN OF CARE: Admit to inpatient   PATIENT DISPOSITION:  PACU - hemodynamically stable.   Delay start of Pharmacological VTE agent (>24hrs) due to surgical blood loss or risk of bleeding: yes

## 2017-09-08 NOTE — Op Note (Signed)
PATIENT:  Girard Cooter  61 y.o. male  PRE-OPERATIVE DIAGNOSIS:  osteomyelitis distal phalanx of left hallux and infected ulcer of left foot  POST-OPERATIVE DIAGNOSIS:  osteomyelitis distal phalanx of left hallux and infected ulcer of left foot  PROCEDURE:  Procedure(s): PARTIAL TOE AMPUTATION LEFT HALLUX (Left)  SURGEON:  Surgeon(s) and Role:    * Nykira Reddix, Layla Barter, DPM - Primary  PHYSICIAN ASSISTANT:   ASSISTANTS: none   ANESTHESIA:   local and MAC  EBL:  Total I/O In: 600 [I.V.:600] Out: -   BLOOD ADMINISTERED:none  DRAINS: none   LOCAL MEDICATIONS USED:  MARCAINE   , LIDOCAINE  and Amount: 15 ml  SPECIMEN:  Source of Specimen:  left hallux bone  DISPOSITION OF SPECIMEN:  PATHOLOGY  COUNTS:  YES  TOURNIQUET:   Total Tourniquet Time Documented: Leg (Left) - 37 minutes Total: Leg (Left) - 37 minutes   Patient was brought into the operating room laid supine on the operating table. Ankle tourniquet was applied to the surgical extremity. Following IV sedation, a local block was achieved using 15 cc of mixture of 1% plain lidocaine with 0.5% marcaine. The foot was the prepped, scrubbed and draped in aseptic manner. Surgical Leg was elevated for 3 minutes and tourniquet on the surgical site was inflatted at 273mHG.   MRI and inflammatory markers are consistent with osteomyelitis of the distal phalanx of the left hallux.   Racket type incision was planned for left hallux partial toe amputation. Using #15 blade incision was made from dorsal and around the left hallux. Toe was dislocated at the lnterphalangeal joint. Bone fragment was noted at the Interphalangeal joint. Distal phalanx bone was identified to be necrotic, non viable and soft. Using deep blade and towel clamp, the distal part of the left hallux was removed, along with where ulcer was. Full thickness plantar  flap preserved to close the incision. All necrotic and non viable soft tissues around the IPJ was  removed. Toe was sent for pathology. Deep wound cultures were taken at this time. Clean margin for the bone was taken. I removed the head of the proximal phalanx and sent it to pathology for clean margin. Pulse lavage was used to irrigate the wound and surgical site. Excess skin was removed and skin was closed. Amputation site was completely closed using 3-0 Prolene. Surgical dressing was applied which consisted betadine adaptic and dry sterile dressing. Tourniquet was deflated. Capillary refill was immediate.   Patient to transfer back to floor.

## 2017-09-08 NOTE — Anesthesia Postprocedure Evaluation (Signed)
Anesthesia Post Note  Patient: Calvin Shields  Procedure(s) Performed: Procedure(s) (LRB): PARTIAL TOE AMPUTATION LEFT HALLUX (Left)  Patient location during evaluation: PACU Anesthesia Type: MAC Level of consciousness: awake and patient cooperative Pain management: pain level controlled Vital Signs Assessment: post-procedure vital signs reviewed and stable Respiratory status: spontaneous breathing, nonlabored ventilation and respiratory function stable Cardiovascular status: blood pressure returned to baseline Postop Assessment: no signs of nausea or vomiting Anesthetic complications: no     Last Vitals:  Vitals:   09/08/17 1250 09/08/17 1255  BP:    Pulse:    Resp: (!) 63 (!) 26  Temp:    SpO2: 98% 100%    Last Pain:  Vitals:   09/08/17 1206  TempSrc: Oral  PainSc:                  [name] J

## 2017-09-08 NOTE — Anesthesia Preprocedure Evaluation (Signed)
Anesthesia Evaluation  Patient identified by MRN, date of birth, ID band Patient awake    Reviewed: Allergy & Precautions, NPO status , Patient's Chart, lab work & pertinent test results  Airway Mallampati: I  TM Distance: >3 FB Neck ROM: Full    Dental  (+) Partial Upper   Pulmonary neg pulmonary ROS,    breath sounds clear to auscultation       Cardiovascular hypertension, Pt. on medications  Rhythm:Regular Rate:Normal     Neuro/Psych    GI/Hepatic negative GI ROS,   Endo/Other  diabetes, Type 2, Oral Hypoglycemic Agents  Renal/GU      Musculoskeletal   Abdominal   Peds  Hematology   Anesthesia Other Findings   Reproductive/Obstetrics                             Anesthesia Physical Anesthesia Plan  ASA: III  Anesthesia Plan: MAC   Post-op Pain Management:    Induction: Intravenous  PONV Risk Score and Plan:   Airway Management Planned: Simple Face Mask  Additional Equipment:   Intra-op Plan:   Post-operative Plan:   Informed Consent: I have reviewed the patients History and Physical, chart, labs and discussed the procedure including the risks, benefits and alternatives for the proposed anesthesia with the patient or authorized representative who has indicated his/her understanding and acceptance.     Plan Discussed with:   Anesthesia Plan Comments:         Anesthesia Quick Evaluation

## 2017-09-08 NOTE — Progress Notes (Signed)
CHG wipe down providedX2. PCR negative for MRSA, positive for Staph. NPO since MN.

## 2017-09-08 NOTE — Progress Notes (Signed)
Patient was seen this evening at bedside with his family. He is status post partial left hallux amputation. Surgical dressing is dry clean and intact. No strike through noted. He is not having any pain. No discomfort.   A: status post partial left hallux amputation secondary to osteomyelitis   P: patient examined and evaluated. Plan was explained to him and the family in layman's term.  Please reinforce dressing if there is strike through. I explained patient to keep the dressing clean, dry and intact. He is not to get the dressing wet. Do not walk on it unless he is walking to bathroom and bed with surgical boot on at all the time.   Patient is stable to discharge from podiatry point of view. Please give him one week of oral antibiotics. I will change the antibiotics if need to based on OR wound cultures.  Please give him surgical shoe before discharge.  Please order post op radiographs before discharge.  I will have my office call him and [facility]   Thank you for the consult.

## 2017-09-09 ENCOUNTER — Other Ambulatory Visit: Payer: Self-pay | Admitting: "Endocrinology

## 2017-09-09 ENCOUNTER — Encounter (HOSPITAL_COMMUNITY): Payer: Self-pay | Admitting: Podiatry

## 2017-09-09 ENCOUNTER — Inpatient Hospital Stay (HOSPITAL_COMMUNITY): Payer: BLUE CROSS/BLUE SHIELD

## 2017-09-09 LAB — GLUCOSE, CAPILLARY
Glucose-Capillary: 187 mg/dL — ABNORMAL HIGH (ref 65–99)
Glucose-Capillary: 172 mg/dL — ABNORMAL HIGH (ref 65–99)

## 2017-09-09 MED ORDER — SULFAMETHOXAZOLE-TRIMETHOPRIM 800-160 MG PO TABS: 1.0000 | ORAL_TABLET | Freq: Two times a day (BID) | ORAL | 0 refills | Status: AC

## 2017-09-09 NOTE — Addendum Note (Signed)
Addendum  created 09/09/17 1328 by Despina HiddenIdacavage, Aliceson Dolbow J, CRNA   Sign clinical note

## 2017-09-09 NOTE — Anesthesia Postprocedure Evaluation (Signed)
Anesthesia Post Note  Patient: Calvin Shields  Procedure(s) Performed: Procedure(s) (LRB): PARTIAL TOE AMPUTATION LEFT HALLUX (Left)  Patient location during evaluation: Nursing Unit Anesthesia Type: MAC Level of consciousness: awake and alert, oriented and patient cooperative Pain management: pain level controlled Vital Signs Assessment: post-procedure vital signs reviewed and stable Respiratory status: spontaneous breathing, nonlabored ventilation and respiratory function stable Cardiovascular status: blood pressure returned to baseline Postop Assessment: no signs of nausea or vomiting Anesthetic complications: no     Last Vitals:  Vitals:   09/09/17 0512 09/09/17 0754  BP: (!) 142/67   Pulse: 80   Resp: 18   Temp: 36.7 C   SpO2: 100% 93%    Last Pain:  Vitals:   09/09/17 0900  TempSrc:   PainSc: 4                  Zacharius Funari J

## 2017-09-09 NOTE — Discharge Summary (Signed)
Physician Discharge Summary  Boswell ZOX:[id number] DOB: 1956/11/23 DOA: 09/03/2017  PCP: Louie Boston., MD  Admit date: 09/03/2017 Discharge date: 09/09/2017  Time spent: 45 minutes  Recommendations for Outpatient Follow-up:  -Will be discharged home today. -Will follow up with podiatry as scheduled for later this week. -Bactrim DS for 7 days.   Discharge Diagnoses:  Principal Problem:   Osteomyelitis (HCC) Active Problems:   Type II diabetes mellitus, uncontrolled (HCC)   Mixed hyperlipidemia   Essential hypertension, benign   Class 2 obesity due to excess calories with serious comorbidity and body mass index (BMI) of 37.0 to 37.9 in adult   Discharge Condition: Stable and improved  Filed Weights   09/03/17 0717 09/03/17 1351 09/06/17 2052  Weight: 113.4 kg (250 lb) 112.8 kg (248 lb 9.6 oz) 112.1 kg (247 lb 3.2 oz)    History of present illness:  Calvin Shields is a 61 y.o. male with history of diabetes, hypertension, hyperlipidemia who presents to the hospital today with about a one-week history of progressive left big toe edema and erythema. He states that earlier this year in May he had a small ulcer of that toe that was treated topically with wound care and antibiotics by a doctor in [place]. He denies any fevers or chills. Has not been on antibiotics in the past 6 weeks. In the ED he is found to be nontoxic, afebrile. X-ray of the left foot shows osteomyelitis of the distal phalanx of the left great toe with pathologic intra-articular fracture at the base of the proximal phalanx extending into the IP joint consistent with septic arthritis. Admission has been requested.  Hospital Course:   Osteomyelitis/Cellulitis/septic arthritis of the left big toe -S/p left partial toe amputation on 9/10. -Per podiatry ok to go home today with 7 days of Bactrim. -They will follow up with him later this week.  Type 2 diabetes -Well controlled, continue  home medications.  Hypertension -Fair control, continue current medications.  Hyperlipidemia -Continue statin   Procedures:   PARTIAL TOE AMPUTATION LEFT HALLUX (Left)  Consultations:  Dr. Allena Katz, Podiatry  Discharge Instructions  Discharge Instructions    Diet - low sodium heart healthy    Complete by:  As directed    Increase activity slowly    Complete by:  As directed      Allergies as of 09/09/2017      Reactions   Codeine Nausea And Vomiting      Medication List    STOP taking these medications   naproxen 500 MG tablet Commonly known as:  NAPROSYN     TAKE these medications   allopurinol 100 MG tablet Commonly known as:  ZYLOPRIM Take 100 mg by mouth daily.   aspirin EC 81 MG tablet Take 81 mg by mouth daily.   ibuprofen 800 MG tablet Commonly known as:  ADVIL,MOTRIN Take 800 mg by mouth every 8 (eight) hours as needed.   JARDIANCE 25 MG Tabs tablet Generic drug:  empagliflozin TAKE 1 TABLET BY MOUTH EVERY MORNING.   lisinopril-hydrochlorothiazide 20-25 MG tablet Commonly known as:  PRINZIDE,ZESTORETIC Take 1 tablet by mouth daily.   multivitamin with minerals Tabs tablet Take 1 tablet by mouth daily.   pravastatin 40 MG tablet Commonly known as:  PRAVACHOL TAKE (1) TABLET BY MOUTH ONCE DAILY.   sulfamethoxazole-trimethoprim 800-160 MG tablet Commonly known as:  BACTRIM DS,SEPTRA DS Take 1 tablet by mouth 2 (two) times daily.   VICTOZA 18 MG/3ML Sopn Generic drug:  liraglutide INJECT 1.8 MG SUBQ EVERY DAY.            Discharge Care Instructions        Start     Ordered   09/09/17 0000  sulfamethoxazole-trimethoprim (BACTRIM DS,SEPTRA DS) 800-160 MG tablet  2 times daily     09/09/17 1131   09/09/17 0000  Increase activity slowly     09/09/17 1131   09/09/17 0000  Diet - low sodium heart healthy     09/09/17 1131     Allergies  Allergen Reactions  . Codeine Nausea And Vomiting   Follow-up Information    Erskine Emeryatel,  Prayashkumar, DPM. Schedule an appointment as soon as possible for a visit in 1 week(s).   Specialty:  Podiatry Why:  As scheduled by his office Contact information: [address] [place] [address] [phone]            The results of significant diagnostics from this hospitalization (including imaging, microbiology, ancillary and laboratory) are listed below for reference.    Significant Diagnostic Studies: Dg Chest 2 View  Result Date: 09/03/2017 CLINICAL DATA:  61 year old male with chest pain and shortness of breath. Being treated for diabetic lower extremity ulcer. EXAM: CHEST  2 VIEW COMPARISON:  [facility]vis 01/12/2016 (no images available). FINDINGS: Seated AP and lateral views of the chest. Cardiac size and mediastinal contours are within normal limits. Lung volumes are within normal limits. No pneumothorax, pulmonary edema, pleural effusion or confluent pulmonary opacity identified. No acute osseous abnormality identified. Negative visible bowel gas pattern. IMPRESSION: No acute cardiopulmonary abnormality. Electronically Signed   By: Odessa FlemingH  Hall M.D.   On: 09/03/2017 08:36   Mr Foot Left W Wo Contrast  Result Date: 09/04/2017 CLINICAL DATA:  Left foot swelling. Diabetes. Concern for great toe osteomyelitis given findings on radiography. EXAM: MRI OF THE LEFT FOREFOOT WITHOUT AND WITH CONTRAST TECHNIQUE: Multiplanar, multisequence MR imaging of the left forefoot was performed both before and after administration of intravenous contrast. CONTRAST:  20mL MULTIHANCE GADOBENATE DIMEGLUMINE 529 MG/ML IV SOLN COMPARISON:  None. FINDINGS: Bones/Joint/Cartilage Mildly comminuted fracture the base the distal phalanx great toe with transverse component and a longitudinal component extending into the joint. Below this is an abnormal 2.7 by 2.5 by 1.1 cm region of hypoenhancement favoring tissue necrosis along the plantar foot. Abnormal 1.3 by 0.8 by 0.8  cm fluid collection around the tuft of the distal phalanx tracking to the distal tip of the great toe, probably a draining abscess or sinus tract draining from the distal phalanx. Extensive cellulitis in the great toe. Suspected septic arthritis of the interphalangeal joint of the great toe with complex fluid extending dorsal to the joint there is some reactive edema in the proximal phalanx great toe without well-defined osteomyelitis. No other osteomyelitis is identified in the forefoot. K-wire noted in the medial cuneiform. First digit sesamoids intact. Ligaments Lisfranc ligament intact Muscles and Tendons Low-level edema and enhancement along the plantar musculature of the foot, notably the adductor hallucis. Soft tissues Cellulitis tracks in the dorsum of the foot and into the first and second toes. IMPRESSION: 1. Osteomyelitis of the distal phalanx great toe with a draining fluid collection tracking around the tuft of the distal phalanx to the distal tip of the toe, and hypoenhancing tissues plantar to the distal phalanx possibly from ulceration and tissue necrosis. Suspected septic interphalangeal joint. Reactive edema in the proximal phalanx without definite osteomyelitis of the  proximal phalanx at this time. Known fracture the base of the distal phalanx great toe. 2. Cellulitis in the first and second toes tracking in the dorsum of the foot. Potential mild myositis with edema signal and mild enhancement tracking along the adductor hallucis muscle. Electronically Signed   By: Gaylyn Rong M.D.   On: 09/04/2017 18:56   Dg Foot Complete Left  Result Date: 09/03/2017 CLINICAL DATA:  Cellulitis, diabetes mellitus, hypertension EXAM: LEFT FOOT - COMPLETE 3+ VIEW COMPARISON:  None FINDINGS: Fragment of K-wire in the medial cuneiform. Mild osseous demineralization. Diffuse soft tissue swelling. Bone destruction at the distal phalanx of the great toe with comminuted mildly displaced fracture at the base,  extending intra-articular at the IP joint. Findings are consistent with osteomyelitis with superimposed pathologic fracture and septic arthritis of the IP joint great toe. Proximal phalanx of great toe appears intact. No additional fracture or dislocation. Small plantar calcaneal spur. Talonavicular degenerative changes with talar beaking and widening of the talonavicular joint. Pes planus. IMPRESSION: Osteomyelitis of the distal phalanx LEFT great toe with pathologic intra-articular fracture at the base of the proximal phalanx extending into the IP joint consistent with septic arthritis. Degenerative changes at talonavicular joint. Electronically Signed   By: Ulyses Southward M.D.   On: 09/03/2017 08:39    Microbiology: Recent Results (from the past 240 hour(s))  Surgical pcr screen     Status: Abnormal   Collection Time: 09/04/17  6:44 PM  Result Value Ref Range Status   MRSA, PCR NEGATIVE NEGATIVE Final   Staphylococcus aureus POSITIVE (A) NEGATIVE Final    Comment: RESULT CALLED TO, READ BACK BY AND VERIFIED WITH: LIGHTNER,N  BY MATTHEWS, B 9.6.18   Aerobic Culture (superficial specimen)     Status: None (Preliminary result)   Collection Time: 09/08/17  1:37 PM  Result Value Ref Range Status   Specimen Description TOE LEFT  Final   Special Requests NONE  Final   Gram Stain   Final    FEW WBC PRESENT,BOTH PMN AND MONONUCLEAR NO ORGANISMS SEEN    Culture   Final    NO GROWTH < 24 HOURS Performed at [facility], [address] [place], [place] [address]    Report Status PENDING  Incomplete     Labs: Basic Metabolic Panel:  Recent Labs Lab 09/03/17 0727 09/04/17 0553 09/06/17 0622 09/07/17 0642 09/08/17 0509  NA 135 139 135 135 136  K 3.9 3.5 3.7 3.5 3.4*  CL 99* 104 99* 97* 96*  CO2 GLUCOSE 150* 112* 129* 142* 146*  BUN 17 14 21* 23* 25*  CREATININE 1.17 1.16 1.34* 1.69* 1.55*  CALCIUM 8.9 9.0 9.3 9.3 9.1   Liver Function Tests:  Recent  Labs Lab 09/03/17 0727  AST 19  ALT 21  ALKPHOS 85  BILITOT 0.9  PROT 8.1  ALBUMIN 3.7   No results for input(s): LIPASE, AMYLASE in the last 168 hours. No results for input(s): AMMONIA in the last 168 hours. CBC:  Recent Labs Lab 09/03/17 0727 09/04/17 0553 09/05/17 0604 09/06/17 0622 09/07/17 0642  WBC 11.2* 10.0 12.1* 9.7 10.8*  NEUTROABS 8.1*  --   --   --   --   HGB 11.6* 11.0* 12.1* 12.7* 12.3*  HCT 35.8* 33.9* 37.9* 38.9* 38.4*  MCV 87.1 86.9 86.1 86.4 86.9  PLT 415* 447* 592* 624* 649*   Cardiac Enzymes:  Recent Labs Lab 09/03/17 0727  TROPONINI <0.03  BNP: BNP (last 3 results) No results for input(s): BNP in the last 8760 hours.  ProBNP (last 3 results) No results for input(s): PROBNP in the last 8760 hours.  CBG:  Recent Labs Lab 09/08/17 1420 09/08/17 1632 09/08/17 2012 09/09/17 0749 09/09/17 1108  GLUCAP 118* 166* 187* 187* 172*       Signed:  HERNANDEZ ACOSTA,ESTELA  [facility] Pager: [phone] 09/09/2017, 11:32 AM

## 2017-09-09 NOTE — Progress Notes (Signed)
PT c/o cramping in left leg after surgery. PT wants xray and check for clot. PT has stated he is refusing discharge until his pain is gone and an xray on his leg is complete.

## 2017-09-12 LAB — AEROBIC CULTURE  (SUPERFICIAL SPECIMEN)

## 2017-09-12 LAB — AEROBIC CULTURE W GRAM STAIN (SUPERFICIAL SPECIMEN): Culture: NO GROWTH

## 2017-09-15 LAB — ANAEROBIC CULTURE

## 2017-10-14 ENCOUNTER — Telehealth: Payer: Self-pay | Admitting: "Endocrinology

## 2017-10-14 NOTE — Telephone Encounter (Signed)
Pt notified to contact his podiatrist

## 2017-10-14 NOTE — Telephone Encounter (Signed)
Calvin Shields is asking for a Rx for Diabetic Shoes his surgeon that did surgery on his foot states that he needs to work in diabetic shoes , please advise?

## 2017-10-17 LAB — RENAL FUNCTION PANEL
Albumin: 4.1 g/dL (ref 3.6–5.1)
BUN: 19 mg/dL (ref 7–25)
CALCIUM: 9.9 mg/dL (ref 8.6–10.3)
CO2: 31 mmol/L (ref 20–32)
Chloride: 99 mmol/L (ref 98–110)
Creat: 1.19 mg/dL (ref 0.70–1.25)
Glucose, Bld: 143 mg/dL — ABNORMAL HIGH (ref 65–99)
POTASSIUM: 4.3 mmol/L (ref 3.5–5.3)
Phosphorus: 4.1 mg/dL (ref 2.5–4.5)
Sodium: 138 mmol/L (ref 135–146)

## 2017-10-17 LAB — HEMOGLOBIN A1C
EAG (MMOL/L): 8.4 (calc)
Hgb A1c MFr Bld: 6.9 % of total Hgb — ABNORMAL HIGH (ref <5.7)
Mean Plasma Glucose: 151 (calc)

## 2017-10-17 LAB — LIPID PANEL
Cholesterol: 197 mg/dL (ref <200)
HDL: 47 mg/dL (ref >40)
LDL CHOLESTEROL (CALC): 125 mg/dL — AB
NON-HDL CHOLESTEROL (CALC): 150 mg/dL — AB (ref <130)
Total CHOL/HDL Ratio: 4.2 (calc) (ref <5.0)
Triglycerides: 141 mg/dL (ref <150)

## 2017-11-04 ENCOUNTER — Ambulatory Visit: Payer: BLUE CROSS/BLUE SHIELD | Admitting: "Endocrinology

## 2017-11-05 ENCOUNTER — Ambulatory Visit (INDEPENDENT_AMBULATORY_CARE_PROVIDER_SITE_OTHER): Payer: BLUE CROSS/BLUE SHIELD | Admitting: "Endocrinology

## 2017-11-05 ENCOUNTER — Encounter: Payer: Self-pay | Admitting: "Endocrinology

## 2017-11-05 ENCOUNTER — Other Ambulatory Visit: Payer: Self-pay

## 2017-11-05 VITALS — BP 140/82 | HR 79 | Ht 69.0 in | Wt 253.0 lb

## 2017-11-05 DIAGNOSIS — I10 Essential (primary) hypertension: Secondary | ICD-10-CM

## 2017-11-05 DIAGNOSIS — E782 Mixed hyperlipidemia: Secondary | ICD-10-CM

## 2017-11-05 DIAGNOSIS — E1165 Type 2 diabetes mellitus with hyperglycemia: Secondary | ICD-10-CM | POA: Diagnosis not present

## 2017-11-05 MED ORDER — GLUCOSE BLOOD VI STRP: ORAL_STRIP | 5 refills | Status: DC

## 2017-11-05 MED ORDER — ACCU-CHEK FASTCLIX LANCETS MISC: 2 refills | Status: DC

## 2017-11-05 MED ORDER — GLUCOSE BLOOD VI STRP: ORAL_STRIP | 2 refills | Status: DC

## 2017-11-05 NOTE — Patient Instructions (Signed)

## 2017-11-05 NOTE — Progress Notes (Signed)
Subjective:    Patient ID: Calvin Shields, male    DOB: 09/16/1956, PCP Louie Boston., MD   Past Medical History:  Diagnosis Date  . Diabetes mellitus without complication (HCC)   . Hyperlipidemia   . Hypertension    Past Surgical History:  Procedure Laterality Date  . right leg surgery      Social History   Socioeconomic History  . Marital status: Married    Spouse name: None  . Number of children: None  . Years of education: None  . Highest education level: None  Social Needs  . Financial resource strain: None  . Food insecurity - worry: None  . Food insecurity - inability: None  . Transportation needs - medical: None  . Transportation needs - non-medical: None  Occupational History  . None  Tobacco Use  . Smoking status: Never Smoker  . Smokeless tobacco: Never Used  Substance and Sexual Activity  . Alcohol use: No  . Drug use: No  . Sexual activity: None  Other Topics Concern  . None  Social History Narrative  . None   Outpatient Encounter Medications as of 11/05/2017  Medication Sig  . ACCU-CHEK FASTCLIX LANCETS MISC Use to test blood glucose 2 times a day.  . allopurinol (ZYLOPRIM) 100 MG tablet Take 100 mg by mouth daily.  Marland Kitchen aspirin EC 81 MG tablet Take 81 mg by mouth daily.  Marland Kitchen ibuprofen (ADVIL,MOTRIN) 800 MG tablet Take 800 mg by mouth every 8 (eight) hours as needed.  Marland Kitchen lisinopril-hydrochlorothiazide (PRINZIDE,ZESTORETIC) 20-25 MG per tablet Take 1 tablet by mouth daily.  . Multiple Vitamin (MULTIVITAMIN WITH MINERALS) TABS tablet Take 1 tablet by mouth daily.  . pravastatin (PRAVACHOL) 40 MG tablet TAKE (1) TABLET BY MOUTH ONCE DAILY.  Marland Kitchen VICTOZA 18 MG/3ML SOPN INJECT 1.8 MG SUBQ EVERY DAY.  . [DISCONTINUED] glucose blood (ACCU-CHEK GUIDE) test strip Use as instructed  . [DISCONTINUED] JARDIANCE 25 MG TABS tablet TAKE 1 TABLET BY MOUTH EVERY MORNING.   No facility-administered encounter medications on file as of 11/05/2017.     ALLERGIES: Allergies  Allergen Reactions  . Codeine Nausea And Vomiting   VACCINATION STATUS:  There is no immunization history on file for this patient.  Diabetes  He presents for his follow-up diabetic visit. He has type 2 diabetes mellitus. His disease course has been stable. There are no hypoglycemic associated symptoms. Pertinent negatives for hypoglycemia include no confusion, headaches, pallor or seizures. There are no diabetic associated symptoms. Pertinent negatives for diabetes include no chest pain, no fatigue, no polydipsia, no polyphagia, no polyuria and no weakness. There are no hypoglycemic complications. Symptoms are stable. Diabetic complications include peripheral neuropathy. Risk factors for coronary artery disease include diabetes mellitus, dyslipidemia, hypertension, male sex, obesity and sedentary lifestyle. He is compliant with treatment most of the time. His weight is increasing steadily. He is following a generally unhealthy diet. When asked about meal planning, he reported none. He has had a previous visit with a dietitian. He never participates in exercise. An ACE inhibitor/angiotensin II receptor blocker is being taken. Eye exam is current.  Hyperlipidemia  This is a chronic problem. The current episode started more than 1 year ago. Exacerbating diseases include diabetes and obesity. Pertinent negatives include no chest pain, myalgias or shortness of breath. Current antihyperlipidemic treatment includes statins. Risk factors for coronary artery disease include diabetes mellitus, dyslipidemia, hypertension, male sex, obesity and a sedentary lifestyle.  Hypertension  This is a chronic problem. The  current episode started more than 1 year ago. Pertinent negatives include no chest pain, headaches, neck pain, palpitations or shortness of breath. Risk factors for coronary artery disease include dyslipidemia, diabetes mellitus, obesity, male gender and sedentary lifestyle. Past  treatments include ACE inhibitors.     Review of Systems  Constitutional: Negative for chills, fatigue, fever and unexpected weight change.  HENT: Negative for dental problem, mouth sores and trouble swallowing.   Eyes: Negative for visual disturbance.  Respiratory: Negative for cough, choking, chest tightness, shortness of breath and wheezing.   Cardiovascular: Negative for chest pain, palpitations and leg swelling.  Gastrointestinal: Negative for abdominal distention, abdominal pain, constipation, diarrhea, nausea and vomiting.  Endocrine: Negative for polydipsia, polyphagia and polyuria.  Genitourinary: Negative for dysuria, flank pain, hematuria and urgency.  Musculoskeletal: Negative for back pain, gait problem, myalgias and neck pain.  Skin: Negative for pallor, rash and wound.  Neurological: Negative for seizures, syncope, weakness, numbness and headaches.  Psychiatric/Behavioral: Negative.  Negative for confusion and dysphoric mood.    Objective:    BP 140/82 (BP Location: Right Arm, Patient Position: Sitting, Cuff Size: Shields)   Pulse 79   Ht 5\' 9"  (1.753 m)   Wt 253 lb (114.8 kg)   BMI 37.36 kg/m   Wt Readings from Last 3 Encounters:  11/05/17 253 lb (114.8 kg)  09/06/17 247 lb 3.2 oz (112.1 kg)  07/15/17 260 lb (117.9 kg)    Physical Exam  Constitutional: He is oriented to person, place, and time. He appears well-developed and well-nourished. He is cooperative. No distress.  HENT:  Head: Normocephalic and atraumatic.  Eyes: EOM are normal.  Neck: Normal range of motion. Neck supple. No tracheal deviation present. No thyromegaly present.  Cardiovascular: Normal rate, S1 normal, S2 normal and normal heart sounds. Exam reveals no gallop.  No murmur heard. Pulses:      Dorsalis pedis pulses are 1+ on the right side, and 1+ on the left side.       Posterior tibial pulses are 1+ on the right side, and 1+ on the left side.  Pulmonary/Chest: Breath sounds normal. No  respiratory distress. He has no wheezes.  Abdominal: Soft. Bowel sounds are normal. He exhibits no distension. There is no tenderness. There is no guarding and no CVA tenderness.  Musculoskeletal: He exhibits deformity. He exhibits no edema.       Right shoulder: He exhibits no swelling and no deformity.  He has significant callus on his right toe following with a podiatrist.  Neurological: He is alert and oriented to person, place, and time. He has normal strength and normal reflexes. No cranial nerve deficit or sensory deficit. Gait normal.  Skin: Skin is warm and dry. No rash noted. No cyanosis. Nails show no clubbing.  Psychiatric: He has a normal mood and affect. His speech is normal and behavior is normal. Judgment and thought content normal. Cognition and memory are normal.   Results for HADDON, FYFE (MRN [id number]) as of 11/05/2017 12:44  Ref. Range 10/16/2017 07:17  Sodium Latest Ref Range: 135 - 146 mmol/L 138  Potassium Latest Ref Range: 3.5 - 5.3 mmol/L 4.3  Chloride Latest Ref Range: 98 - 110 mmol/L 99  CO2 Latest Ref Range: 20 - 32 mmol/L 31  Glucose Latest Ref Range: 65 - 99 mg/dL 578 (H)  Mean Plasma Glucose Latest Units: (calc) 151  BUN Latest Ref Range: 7 - 25 mg/dL 19  Creatinine Latest Ref Range: 0.70 - 1.25 mg/dL 4.69  Calcium Latest Ref Range: 8.6 - 10.3 mg/dL 9.9  BUN/Creatinine Ratio Latest Ref Range: 6 - 22 (calc) NOT APPLICABLE  Phosphorus Latest Ref Range: 2.5 - 4.5 mg/dL 4.1  Total CHOL/HDL Ratio Latest Ref Range: <5.0 (calc) 4.2  Cholesterol Latest Ref Range: <200 mg/dL 045197  HDL Cholesterol Latest Ref Range: >40 mg/dL 47  LDL Cholesterol (Calc) Latest Units: mg/dL (calc) 409125 (H)  Triglycerides Latest Ref Range: <150 mg/dL 811141  Hemoglobin B1YA1C Latest Ref Range: <5.7 % of total Hgb 6.9 (H)  Albumin MSPROF Latest Ref Range: 3.6 - 5.1 g/dL 4.1  eAG (mmol/L) Latest Units: (calc) 8.4  Non-HDL Cholesterol (Calc) Latest Ref Range: <130 mg/dL (calc) 782150 (H)     Assessment & Plan:   1. Diabetes mellitus without complication (HCC)  --diabetes is  complicated by peripheral neuropathy , and patient remains at a high risk for more acute and chronic complications of diabetes which include CAD, CVA, CKD, retinopathy, and neuropathy. These are all discussed in detail with the patient.  Patient came with stable A1c of   6.9% .  - Recent labs reviewed.  - I have re-counseled the patient on diet management and  and weight loss by adopting a carbohydrate restricted / protein rich  Diet.  -  Suggestion is made for him to avoid simple carbohydrates  from his diet including Cakes, Sweet Desserts / Pastries, Ice Cream, Soda (diet and regular), Sweet Tea, Candies, Chips, Cookies, Store Bought Juices, Alcohol in Excess of  1-2 drinks a day, Artificial Sweeteners, and "Sugar-free" Products. This will help patient to have stable blood glucose profile and potentially avoid unintended weight gain.   - Patient is advised to stick to a routine mealtimes to eat 3 meals  a day and avoid unnecessary snacks ( to snack only to correct hypoglycemia).  - I have approached patient with the following individualized plan to manage diabetes and patient agrees.  -I will continue with Victoza 1.8 mg daily and discontinue Jardiance. - Patient is asked to start monitoring blood glucose 2 times a day-daily before breakfast and before supper and report if his readings are above 2003. - He may need at least basal insulin if he loses control. - Patient specific target  for A1c; LDL, HDL, Triglycerides, and  Waist Circumference were discussed in detail.  2) BP/HTN: Controlled. I advised him to continue current medications including ACEI/ARB. 3) Lipids/HPL:  continue statins. 4)  Weight/Diet: No significant success in weight control,, CDE consult in progress, exercise, and carbohydrates information provided.  5) Chronic Care/Health Maintenance:  -Patient is on ACEI/ARB and Statin  medications and encouraged to continue to follow up with Ophthalmology, Podiatrist at least yearly or according to recommendations, and advised to  stay away from smoking. I have recommended yearly flu vaccine and pneumonia vaccination at least every 5 years; moderate intensity exercise for up to 150 minutes weekly; and  sleep for at least 7 hours a day.  - I advised patient to maintain close follow up with Louie Bostonapper, David B., MD for primary care needs.  - Time spent with the patient: 25 min, of which >50% was spent in reviewing his sugar logs , discussing his hypo- and hyper-glycemic episodes, reviewing his current and  previous labs and insulin doses and developing a plan to avoid hypo- and hyper-glycemia.   Follow up plan: -Return in about 3 months (around 02/05/2018) for meter, and logs.  Marquis LunchGebre Analya Louissaint, MD Phone: [phone]  Fax: [phone]  -  This note was  partially dictated with voice recognition software. Similar sounding words can be transcribed inadequately or may not  be corrected upon review.  11/05/2017, 12:42 PM

## 2017-11-07 ENCOUNTER — Other Ambulatory Visit: Payer: Self-pay

## 2017-11-07 ENCOUNTER — Telehealth: Payer: Self-pay | Admitting: "Endocrinology

## 2017-11-07 MED ORDER — GLUCOSE BLOOD VI STRP: 1.0000 | ORAL_STRIP | Freq: Two times a day (BID) | 5 refills | Status: DC

## 2017-11-07 MED ORDER — GLUCOSE BLOOD VI STRP: ORAL_STRIP | 5 refills | Status: DC

## 2017-11-07 NOTE — Telephone Encounter (Signed)
Rx sent 

## 2017-11-07 NOTE — Telephone Encounter (Signed)
Jacqlyn KraussSylvester is asking for a Rx be sent to [facility] for test strips for the Meter Dr. Fransico HimNida sent in for him, please advise?

## 2017-12-17 ENCOUNTER — Other Ambulatory Visit: Payer: Self-pay | Admitting: "Endocrinology

## 2018-01-21 LAB — COMPLETE METABOLIC PANEL WITH GFR
AG Ratio: 1.3 (calc) (ref 1.0–2.5)
ALBUMIN MSPROF: 4.3 g/dL (ref 3.6–5.1)
ALT: 20 U/L (ref 9–46)
AST: 13 U/L (ref 10–35)
Alkaline phosphatase (APISO): 86 U/L (ref 40–115)
BUN: 15 mg/dL (ref 7–25)
CHLORIDE: 100 mmol/L (ref 98–110)
CO2: 30 mmol/L (ref 20–32)
Calcium: 10.3 mg/dL (ref 8.6–10.3)
Creat: 1.14 mg/dL (ref 0.70–1.25)
GFR, EST AFRICAN AMERICAN: 79 mL/min/{1.73_m2} (ref >=60)
GFR, Est Non African American: 68 mL/min/{1.73_m2} (ref >=60)
GLOBULIN: 3.4 g/dL (ref 1.9–3.7)
GLUCOSE: 165 mg/dL — AB (ref 65–99)
Potassium: 3.9 mmol/L (ref 3.5–5.3)
SODIUM: 137 mmol/L (ref 135–146)
TOTAL PROTEIN: 7.7 g/dL (ref 6.1–8.1)
Total Bilirubin: 0.7 mg/dL (ref 0.2–1.2)

## 2018-01-21 LAB — VITAMIN D 25 HYDROXY (VIT D DEFICIENCY, FRACTURES): VIT D 25 HYDROXY: 31 ng/mL (ref 30–100)

## 2018-01-21 LAB — HEMOGLOBIN A1C
Hgb A1c MFr Bld: 7.6 % of total Hgb — ABNORMAL HIGH (ref <5.7)
Mean Plasma Glucose: 171 (calc)
eAG (mmol/L): 9.5 (calc)

## 2018-02-10 ENCOUNTER — Encounter: Payer: Self-pay | Admitting: "Endocrinology

## 2018-02-10 ENCOUNTER — Ambulatory Visit (INDEPENDENT_AMBULATORY_CARE_PROVIDER_SITE_OTHER): Payer: BLUE CROSS/BLUE SHIELD | Admitting: "Endocrinology

## 2018-02-10 VITALS — BP 133/83 | HR 93 | Ht 69.0 in | Wt 254.0 lb

## 2018-02-10 DIAGNOSIS — E1165 Type 2 diabetes mellitus with hyperglycemia: Secondary | ICD-10-CM | POA: Diagnosis not present

## 2018-02-10 DIAGNOSIS — I10 Essential (primary) hypertension: Secondary | ICD-10-CM

## 2018-02-10 DIAGNOSIS — E782 Mixed hyperlipidemia: Secondary | ICD-10-CM

## 2018-02-10 MED ORDER — INSULIN PEN NEEDLE 31G X 8 MM MISC: 1.0000 | 3 refills | Status: DC

## 2018-02-10 MED ORDER — INSULIN DEGLUDEC 200 UNIT/ML ~~LOC~~ SOPN: 20.0000 [IU] | PEN_INJECTOR | Freq: Every day | SUBCUTANEOUS | 2 refills | Status: DC

## 2018-02-10 NOTE — Progress Notes (Signed)
Subjective:    Patient ID: Calvin Shields, male    DOB: 10-11-1956, PCP Louie Bostonapper, David B., MD   Past Medical History:  Diagnosis Date  . Diabetes mellitus without complication (HCC)   . Hyperlipidemia   . Hypertension    Past Surgical History:  Procedure Laterality Date  . AMPUTATION Left 09/08/2017   Procedure: PARTIAL TOE AMPUTATION LEFT HALLUX;  Surgeon: Erskine EmeryPatel, Prayashkumar, DPM;  Location: [facility];  Service: Podiatry;  Laterality: Left;  . right leg surgery      Social History   Socioeconomic History  . Marital status: Married    Spouse name: None  . Number of children: None  . Years of education: None  . Highest education level: None  Social Needs  . Financial resource strain: None  . Food insecurity - worry: None  . Food insecurity - inability: None  . Transportation needs - medical: None  . Transportation needs - non-medical: None  Occupational History  . None  Tobacco Use  . Smoking status: Never Smoker  . Smokeless tobacco: Never Used  Substance and Sexual Activity  . Alcohol use: No  . Drug use: No  . Sexual activity: None  Other Topics Concern  . None  Social History Narrative  . None   Outpatient Encounter Medications as of 02/10/2018  Medication Sig  . ACCU-CHEK FASTCLIX LANCETS MISC Use to test blood glucose 2 times a day.  . allopurinol (ZYLOPRIM) 100 MG tablet Take 100 mg by mouth daily.  Marland Kitchen. aspirin EC 81 MG tablet Take 81 mg by mouth daily.  Marland Kitchen. glucose blood (ACCU-CHEK GUIDE) test strip Use as instructed bid  . glucose blood test strip 1 each 2 (two) times daily by Other route. Use as instructed bid. E11.65  . ibuprofen (ADVIL,MOTRIN) 800 MG tablet Take 800 mg by mouth every 8 (eight) hours as needed.  . Insulin Degludec (TRESIBA FLEXTOUCH) 200 UNIT/ML SOPN Inject 20 Units into the skin at bedtime.  . Insulin Pen Needle (B-D ULTRAFINE III SHORT PEN) 31G X 8 MM MISC 1 each by Does not apply route as directed.  Marland Kitchen. lisinopril-hydrochlorothiazide  (PRINZIDE,ZESTORETIC) 20-25 MG per tablet Take 1 tablet by mouth daily.  . Multiple Vitamin (MULTIVITAMIN WITH MINERALS) TABS tablet Take 1 tablet by mouth daily.  . pravastatin (PRAVACHOL) 40 MG tablet TAKE (1) TABLET BY MOUTH ONCE DAILY.  Marland Kitchen. VICTOZA 18 MG/3ML SOPN INJECT 1.8 MG SUBQ EVERY DAY.   No facility-administered encounter medications on file as of 02/10/2018.    ALLERGIES: Allergies  Allergen Reactions  . Codeine Nausea And Vomiting   VACCINATION STATUS:  There is no immunization history on file for this patient.  Diabetes  He presents for his follow-up diabetic visit. He has type 2 diabetes mellitus. His disease course has been worsening. There are no hypoglycemic associated symptoms. Pertinent negatives for hypoglycemia include no confusion, headaches, pallor or seizures. There are no diabetic associated symptoms. Pertinent negatives for diabetes include no chest pain, no fatigue, no polydipsia, no polyphagia, no polyuria and no weakness. There are no hypoglycemic complications. Symptoms are worsening. Diabetic complications include peripheral neuropathy. Risk factors for coronary artery disease include diabetes mellitus, dyslipidemia, hypertension, male sex, obesity and sedentary lifestyle. He is compliant with treatment most of the time. His weight is increasing steadily. He is following a generally unhealthy diet. When asked about meal planning, he reported none. He has had a previous visit with a dietitian. He never participates in exercise. His breakfast blood glucose range  is generally 140-180 mg/dl. An ACE inhibitor/angiotensin II receptor blocker is being taken. Eye exam is current.  Hyperlipidemia  This is a chronic problem. The current episode started more than 1 year ago. The problem is uncontrolled. Exacerbating diseases include diabetes and obesity. Pertinent negatives include no chest pain, myalgias or shortness of breath. Current antihyperlipidemic treatment includes  statins. Compliance problems include adherence to diet.  Risk factors for coronary artery disease include diabetes mellitus, dyslipidemia, hypertension, male sex, obesity and a sedentary lifestyle.  Hypertension  This is a chronic problem. The current episode started more than 1 year ago. Pertinent negatives include no chest pain, headaches, neck pain, palpitations or shortness of breath. Risk factors for coronary artery disease include dyslipidemia, diabetes mellitus, obesity, male gender and sedentary lifestyle. Past treatments include ACE inhibitors.     Review of Systems  Constitutional: Negative for chills, fatigue, fever and unexpected weight change.  HENT: Negative for dental problem, mouth sores and trouble swallowing.   Eyes: Negative for visual disturbance.  Respiratory: Negative for cough, choking, chest tightness, shortness of breath and wheezing.   Cardiovascular: Negative for chest pain, palpitations and leg swelling.  Gastrointestinal: Negative for abdominal distention, abdominal pain, constipation, diarrhea, nausea and vomiting.  Endocrine: Negative for polydipsia, polyphagia and polyuria.  Genitourinary: Negative for dysuria, flank pain, hematuria and urgency.  Musculoskeletal: Negative for back pain, gait problem, myalgias and neck pain.  Skin: Negative for pallor, rash and wound.  Neurological: Negative for seizures, syncope, weakness, numbness and headaches.  Psychiatric/Behavioral: Negative.  Negative for confusion and dysphoric mood.    Objective:    BP 133/83   Pulse 93   Ht 5\' 9"  (1.753 m)   Wt 254 lb (115.2 kg)   BMI 37.51 kg/m   Wt Readings from Last 3 Encounters:  02/10/18 254 lb (115.2 kg)  11/05/17 253 lb (114.8 kg)  09/06/17 247 lb 3.2 oz (112.1 kg)    Physical Exam  Constitutional: He is oriented to person, place, and time. He appears well-developed and well-nourished. He is cooperative. No distress.  HENT:  Head: Normocephalic and atraumatic.   Eyes: EOM are normal.  Neck: Normal range of motion. Neck supple. No tracheal deviation present. No thyromegaly present.  Cardiovascular: Normal rate, S1 normal, S2 normal and normal heart sounds. Exam reveals no gallop.  No murmur heard. Pulses:      Dorsalis pedis pulses are 1+ on the right side, and 1+ on the left side.       Posterior tibial pulses are 1+ on the right side, and 1+ on the left side.  Pulmonary/Chest: Breath sounds normal. No respiratory distress. He has no wheezes.  Abdominal: Soft. Bowel sounds are normal. He exhibits no distension. There is no tenderness. There is no guarding and no CVA tenderness.  Musculoskeletal: He exhibits deformity. He exhibits no edema.       Right shoulder: He exhibits no swelling and no deformity.  He has significant callus on his right toe following with a podiatrist.  Neurological: He is alert and oriented to person, place, and time. He has normal strength and normal reflexes. No cranial nerve deficit or sensory deficit. Gait normal.  Skin: Skin is warm and dry. No rash noted. No cyanosis. Nails show no clubbing.  Psychiatric: He has a normal mood and affect. His speech is normal and behavior is normal. Judgment and thought content normal. Cognition and memory are normal.   Recent Results (from the past 2160 hour(s))  COMPLETE METABOLIC PANEL  WITH GFR     Status: Abnormal   Collection Time: 01/20/18  9:32 AM  Result Value Ref Range   Glucose, Bld 165 (H) 65 - 99 mg/dL    Comment: .            Fasting reference interval . For someone without known diabetes, a glucose value >125 mg/dL indicates that they may have diabetes and this should be confirmed with a follow-up test. .    BUN 15 7 - 25 mg/dL   Creat 1.61 0.96 - 0.45 mg/dL    Comment: For patients >35 years of age, the reference limit for Creatinine is approximately 13% higher for people identified as African-American. .    GFR, Est Non African American 68 > OR = 60  mL/min/1.59m2   GFR, Est African American 79 > OR = 60 mL/min/1.81m2   BUN/Creatinine Ratio NOT APPLICABLE 6 - 22 (calc)   Sodium 137 135 - 146 mmol/L   Potassium 3.9 3.5 - 5.3 mmol/L   Chloride 100 98 - 110 mmol/L   CO2 30 20 - 32 mmol/L   Calcium 10.3 8.6 - 10.3 mg/dL   Total Protein 7.7 6.1 - 8.1 g/dL   Albumin 4.3 3.6 - 5.1 g/dL   Globulin 3.4 1.9 - 3.7 g/dL (calc)   AG Ratio 1.3 1.0 - 2.5 (calc)   Total Bilirubin 0.7 0.2 - 1.2 mg/dL   Alkaline phosphatase (APISO) 86 40 - 115 U/L   AST 13 10 - 35 U/L   ALT 20 9 - 46 U/L  Hemoglobin A1c     Status: Abnormal   Collection Time: 01/20/18  9:32 AM  Result Value Ref Range   Hgb A1c MFr Bld 7.6 (H) <5.7 % of total Hgb    Comment: For someone without known diabetes, a hemoglobin A1c value of 6.5% or greater indicates that they may have  diabetes and this should be confirmed with a follow-up  test. . For someone with known diabetes, a value <7% indicates  that their diabetes is well controlled and a value  greater than or equal to 7% indicates suboptimal  control. A1c targets should be individualized based on  duration of diabetes, age, comorbid conditions, and  other considerations. . Currently, no consensus exists regarding use of hemoglobin A1c for diagnosis of diabetes for children. .    Mean Plasma Glucose 171 (calc)   eAG (mmol/L) 9.5 (calc)  VITAMIN D 25 Hydroxy (Vit-D Deficiency, Fractures)     Status: None   Collection Time: 01/20/18  9:32 AM  Result Value Ref Range   Vit D, 25-Hydroxy 31 30 - 100 ng/mL    Comment: Vitamin D Status         25-OH Vitamin D: . Deficiency:                    <20 ng/mL Insufficiency:             20 - 29 ng/mL Optimal:                 > or = 30 ng/mL . For 25-OH Vitamin D testing on patients on  D2-supplementation and patients for whom quantitation  of D2 and D3 fractions is required, the Quest[employer]ureD(TM) 25-OH VIT D, (D2,D3), LC/MS/MS is recommended: order  code 40981 (patients  >71yrs). . For more information on this test, go to: http://education.questdiagnostics.com/faq/FAQ163 (This link is being provided for  informational/educational purposes only.)    Lipid Panel  Component Value Date/Time   CHOL 197 10/16/2017 0717   TRIG 141 10/16/2017 0717   HDL 47 10/16/2017 0717   CHOLHDL 4.2 10/16/2017 0717   VLDL 29 04/15/2016 0836   LDLCALC 116 04/15/2016 0836    Assessment & Plan:   1. Diabetes mellitus without complication (HCC)  --diabetes is  complicated by peripheral neuropathy , and patient remains at a high risk for more acute and chronic complications of diabetes which include CAD, CVA, CKD, retinopathy, and neuropathy. These are all discussed in detail with the patient.  Patient came with increasing A1c of 7.6% from   6.9% .  - Recent labs reviewed.  - I have re-counseled the patient on diet management and  and weight loss by adopting a carbohydrate restricted / protein rich  Diet.  -  Suggestion is made for him to avoid simple carbohydrates  from his diet including Cakes, Sweet Desserts / Pastries, Ice Cream, Soda (diet and regular), Sweet Tea, Candies, Chips, Cookies, Store Bought Juices, Alcohol in Excess of  1-2 drinks a day, Artificial Sweeteners, and "Sugar-free" Products. This will help patient to have stable blood glucose profile and potentially avoid unintended weight gain.  - Patient is advised to stick to a routine mealtimes to eat 3 meals  a day and avoid unnecessary snacks ( to snack only to correct hypoglycemia).  - I have approached patient with the following individualized plan to manage diabetes and patient agrees.  -Based on his presentation was above target blood glucose profile and rising A1c to 7.6%, he is approached for basal insulin.   -He reluctantly accepts Tresiba 20 units nightly associated with monitoring of blood glucose 2 times a day-before breakfast and daily at bedtime.   - I will continue with Victoza 1.8 mg  daily . -He has history of metformin intolerance.  - Patient specific target  for A1c; LDL, HDL, Triglycerides, and  Waist Circumference were discussed in detail.  2) BP/HTN: His blood pressure is controlled to target.  I advised him to continue current medications including ACEI/ARB. 3) Lipids/HPL: His lipid panels revealed uncontrolled LDL at 125.  I advised him to continue pravastatin 40 mg p.o. nightly. 4)  Weight/Diet: No significant success in weight control,, CDE consult in progress, exercise, and carbohydrates information provided.  5) Chronic Care/Health Maintenance:  -Patient is on ACEI/ARB and Statin medications and encouraged to continue to follow up with Ophthalmology, Podiatrist at least yearly or according to recommendations, and advised to  stay away from smoking. I have recommended yearly flu vaccine and pneumonia vaccination at least every 5 years; moderate intensity exercise for up to 150 minutes weekly; and  sleep for at least 7 hours a day.  - I advised patient to maintain close follow up with Louie Boston., MD for primary care needs.  - Time spent with the patient: 25 min, of which >50% was spent in reviewing his blood glucose logs , discussing his hypo- and hyper-glycemic episodes, reviewing his current and  previous labs and insulin doses and developing a plan to avoid hypo- and hyper-glycemia. Please refer to Patient Instructions for Blood Glucose Monitoring and Insulin/Medications Dosing Guide"  in media tab for additional information.   Follow up plan: -Return in about 4 weeks (around 03/10/2018) for follow up with meter and logs- no labs.  Marquis Lunch, MD Phone: [phone]  Fax: [phone]  -  This note was partially dictated with voice recognition software. Similar sounding words can be transcribed inadequately or may not  be corrected upon review.  02/10/2018, 10:40 AM

## 2018-02-10 NOTE — Patient Instructions (Signed)

## 2018-03-10 ENCOUNTER — Encounter: Payer: Self-pay | Admitting: "Endocrinology

## 2018-03-10 ENCOUNTER — Ambulatory Visit: Payer: BLUE CROSS/BLUE SHIELD | Admitting: "Endocrinology

## 2018-03-10 VITALS — BP 128/73 | HR 87 | Ht 69.0 in | Wt 254.0 lb

## 2018-03-10 DIAGNOSIS — I10 Essential (primary) hypertension: Secondary | ICD-10-CM

## 2018-03-10 DIAGNOSIS — E1165 Type 2 diabetes mellitus with hyperglycemia: Secondary | ICD-10-CM

## 2018-03-10 DIAGNOSIS — E782 Mixed hyperlipidemia: Secondary | ICD-10-CM | POA: Diagnosis not present

## 2018-03-10 MED ORDER — METFORMIN HCL 500 MG PO TABS: 500.0000 mg | ORAL_TABLET | Freq: Two times a day (BID) | ORAL | 2 refills | Status: DC

## 2018-03-10 NOTE — Patient Instructions (Signed)

## 2018-03-10 NOTE — Progress Notes (Signed)
Subjective:    Patient ID: Calvin Shields, male    DOB: 27-Jun-1956, PCP Louie Boston., MD   Past Medical History:  Diagnosis Date  . Diabetes mellitus without complication (HCC)   . Hyperlipidemia   . Hypertension    Past Surgical History:  Procedure Laterality Date  . AMPUTATION Left 09/08/2017   Procedure: PARTIAL TOE AMPUTATION LEFT HALLUX;  Surgeon: Erskine Emery, DPM;  Location: [facility];  Service: Podiatry;  Laterality: Left;  . right leg surgery      Social History   Socioeconomic History  . Marital status: Married    Spouse name: None  . Number of children: None  . Years of education: None  . Highest education level: None  Social Needs  . Financial resource strain: None  . Food insecurity - worry: None  . Food insecurity - inability: None  . Transportation needs - medical: None  . Transportation needs - non-medical: None  Occupational History  . None  Tobacco Use  . Smoking status: Never Smoker  . Smokeless tobacco: Never Used  Substance and Sexual Activity  . Alcohol use: No  . Drug use: No  . Sexual activity: None  Other Topics Concern  . None  Social History Narrative  . None   Outpatient Encounter Medications as of 03/10/2018  Medication Sig  . ACCU-CHEK FASTCLIX LANCETS MISC Use to test blood glucose 2 times a day.  . allopurinol (ZYLOPRIM) 100 MG tablet Take 100 mg by mouth daily.  Marland Kitchen aspirin EC 81 MG tablet Take 81 mg by mouth daily.  Marland Kitchen glucose blood (ACCU-CHEK GUIDE) test strip Use as instructed bid  . glucose blood test strip 1 each 2 (two) times daily by Other route. Use as instructed bid. E11.65  . ibuprofen (ADVIL,MOTRIN) 800 MG tablet Take 800 mg by mouth every 8 (eight) hours as needed.  . Insulin Degludec (TRESIBA FLEXTOUCH) 200 UNIT/ML SOPN Inject 20 Units into the skin at bedtime.  . Insulin Pen Needle (B-D ULTRAFINE III SHORT PEN) 31G X 8 MM MISC 1 each by Does not apply route as directed.  Marland Kitchen lisinopril-hydrochlorothiazide  (PRINZIDE,ZESTORETIC) 20-25 MG per tablet Take 1 tablet by mouth daily.  . metFORMIN (GLUCOPHAGE) 500 MG tablet Take 1 tablet (500 mg total) by mouth 2 (two) times daily with a meal.  . Multiple Vitamin (MULTIVITAMIN WITH MINERALS) TABS tablet Take 1 tablet by mouth daily.  . pravastatin (PRAVACHOL) 40 MG tablet TAKE (1) TABLET BY MOUTH ONCE DAILY.  Marland Kitchen VICTOZA 18 MG/3ML SOPN INJECT 1.8 MG SUBQ EVERY DAY.   No facility-administered encounter medications on file as of 03/10/2018.    ALLERGIES: Allergies  Allergen Reactions  . Codeine Nausea And Vomiting   VACCINATION STATUS:  There is no immunization history on file for this patient.  Diabetes  He presents for his follow-up diabetic visit. He has type 2 diabetes mellitus. His disease course has been improving. There are no hypoglycemic associated symptoms. Pertinent negatives for hypoglycemia include no confusion, headaches, pallor or seizures. There are no diabetic associated symptoms. Pertinent negatives for diabetes include no chest pain, no fatigue, no polydipsia, no polyphagia, no polyuria and no weakness. There are no hypoglycemic complications. Symptoms are improving. Diabetic complications include peripheral neuropathy. Risk factors for coronary artery disease include diabetes mellitus, dyslipidemia, hypertension, male sex, obesity and sedentary lifestyle. He is compliant with treatment most of the time. His weight is stable. He is following a generally unhealthy diet. When asked about meal planning, he  reported none. He has had a previous visit with a dietitian. He never participates in exercise. His breakfast blood glucose range is generally 140-180 mg/dl. His bedtime blood glucose range is generally 140-180 mg/dl. His overall blood glucose range is 140-180 mg/dl. An ACE inhibitor/angiotensin II receptor blocker is being taken. Eye exam is current.  Hyperlipidemia  This is a chronic problem. The current episode started more than 1 year ago.  The problem is uncontrolled. Exacerbating diseases include diabetes and obesity. Pertinent negatives include no chest pain, myalgias or shortness of breath. Current antihyperlipidemic treatment includes statins. Compliance problems include adherence to diet.  Risk factors for coronary artery disease include diabetes mellitus, dyslipidemia, hypertension, male sex, obesity and a sedentary lifestyle.  Hypertension  This is a chronic problem. The current episode started more than 1 year ago. Pertinent negatives include no chest pain, headaches, neck pain, palpitations or shortness of breath. Risk factors for coronary artery disease include dyslipidemia, diabetes mellitus, obesity, male gender and sedentary lifestyle. Past treatments include ACE inhibitors.     Review of Systems  Constitutional: Negative for chills, fatigue, fever and unexpected weight change.  HENT: Negative for dental problem, mouth sores and trouble swallowing.   Eyes: Negative for visual disturbance.  Respiratory: Negative for cough, choking, chest tightness, shortness of breath and wheezing.   Cardiovascular: Negative for chest pain, palpitations and leg swelling.  Gastrointestinal: Negative for abdominal distention, abdominal pain, constipation, diarrhea, nausea and vomiting.  Endocrine: Negative for polydipsia, polyphagia and polyuria.  Genitourinary: Negative for dysuria, flank pain, hematuria and urgency.  Musculoskeletal: Negative for back pain, gait problem, myalgias and neck pain.  Skin: Negative for pallor, rash and wound.  Neurological: Negative for seizures, syncope, weakness, numbness and headaches.  Psychiatric/Behavioral: Negative.  Negative for confusion and dysphoric mood.    Objective:    BP 128/73   Pulse 87   Ht 5\' 9"  (1.753 m)   Wt 254 lb (115.2 kg)   BMI 37.51 kg/m   Wt Readings from Last 3 Encounters:  03/10/18 254 lb (115.2 kg)  02/10/18 254 lb (115.2 kg)  11/05/17 253 lb (114.8 kg)    Physical  Exam  Constitutional: He is oriented to person, place, and time. He appears well-developed and well-nourished. He is cooperative. No distress.  HENT:  Head: Normocephalic and atraumatic.  Eyes: EOM are normal.  Neck: Normal range of motion. Neck supple. No tracheal deviation present. No thyromegaly present.  Cardiovascular: Normal rate, S1 normal, S2 normal and normal heart sounds. Exam reveals no gallop.  No murmur heard. Pulses:      Dorsalis pedis pulses are 1+ on the right side, and 1+ on the left side.       Posterior tibial pulses are 1+ on the right side, and 1+ on the left side.  Pulmonary/Chest: Breath sounds normal. No respiratory distress. He has no wheezes.  Abdominal: Soft. Bowel sounds are normal. He exhibits no distension. There is no tenderness. There is no guarding and no CVA tenderness.  Musculoskeletal: He exhibits deformity. He exhibits no edema.       Right shoulder: He exhibits no swelling and no deformity.  He has significant callus on his right toe following with a podiatrist.  Neurological: He is alert and oriented to person, place, and time. He has normal strength and normal reflexes. No cranial nerve deficit or sensory deficit. Gait normal.  Skin: Skin is warm and dry. No rash noted. No cyanosis. Nails show no clubbing.  Psychiatric: He has  a normal mood and affect. His speech is normal and behavior is normal. Judgment and thought content normal. Cognition and memory are normal.   Recent Results (from the past 2160 hour(s))  COMPLETE METABOLIC PANEL WITH GFR     Status: Abnormal   Collection Time: 01/20/18  9:32 AM  Result Value Ref Range   Glucose, Bld 165 (H) 65 - 99 mg/dL    Comment: .            Fasting reference interval . For someone without known diabetes, a glucose value >125 mg/dL indicates that they may have diabetes and this should be confirmed with a follow-up test. .    BUN 15 7 - 25 mg/dL   Creat 1.61 0.96 - 0.45 mg/dL    Comment: For  patients >38 years of age, the reference limit for Creatinine is approximately 13% higher for people identified as African-American. .    GFR, Est Non African American 68 > OR = 60 mL/min/1.32m2   GFR, Est African American 79 > OR = 60 mL/min/1.25m2   BUN/Creatinine Ratio NOT APPLICABLE 6 - 22 (calc)   Sodium 137 135 - 146 mmol/L   Potassium 3.9 3.5 - 5.3 mmol/L   Chloride 100 98 - 110 mmol/L   CO2 30 20 - 32 mmol/L   Calcium 10.3 8.6 - 10.3 mg/dL   Total Protein 7.7 6.1 - 8.1 g/dL   Albumin 4.3 3.6 - 5.1 g/dL   Globulin 3.4 1.9 - 3.7 g/dL (calc)   AG Ratio 1.3 1.0 - 2.5 (calc)   Total Bilirubin 0.7 0.2 - 1.2 mg/dL   Alkaline phosphatase (APISO) 86 40 - 115 U/L   AST 13 10 - 35 U/L   ALT 20 9 - 46 U/L  Hemoglobin A1c     Status: Abnormal   Collection Time: 01/20/18  9:32 AM  Result Value Ref Range   Hgb A1c MFr Bld 7.6 (H) <5.7 % of total Hgb    Comment: For someone without known diabetes, a hemoglobin A1c value of 6.5% or greater indicates that they may have  diabetes and this should be confirmed with a follow-up  test. . For someone with known diabetes, a value <7% indicates  that their diabetes is well controlled and a value  greater than or equal to 7% indicates suboptimal  control. A1c targets should be individualized based on  duration of diabetes, age, comorbid conditions, and  other considerations. . Currently, no consensus exists regarding use of hemoglobin A1c for diagnosis of diabetes for children. .    Mean Plasma Glucose 171 (calc)   eAG (mmol/L) 9.5 (calc)  VITAMIN D 25 Hydroxy (Vit-D Deficiency, Fractures)     Status: None   Collection Time: 01/20/18  9:32 AM  Result Value Ref Range   Vit D, 25-Hydroxy 31 30 - 100 ng/mL    Comment: Vitamin D Status         25-OH Vitamin D: . Deficiency:                    <20 ng/mL Insufficiency:             20 - 29 ng/mL Optimal:                 > or = 30 ng/mL . For 25-OH Vitamin D testing on patients on   D2-supplementation and patients for whom quantitation  of D2 and D3 fractions is required, the QuestAssureD(TM) 25-OH VIT D, (D2,D3), LC/MS/MS  is recommended: order  code [id number] (patients >7859yrs). . For more information on this test, go to: http://education.questdiagnostics.com/faq/FAQ163 (This link is being provided for  informational/educational purposes only.)    Lipid Panel     Component Value Date/Time   CHOL 197 10/16/2017 0717   TRIG 141 10/16/2017 0717   HDL 47 10/16/2017 0717   CHOLHDL 4.2 10/16/2017 0717   VLDL 29 04/15/2016 0836   LDLCALC 116 04/15/2016 0836    Assessment & Plan:   1. Diabetes mellitus without complication (HCC)  - His diabetes is  complicated by peripheral neuropathy , and patient remains at a high risk for more acute and chronic complications of diabetes which include CAD, CVA, CKD, retinopathy, and neuropathy. These are all discussed in detail with the patient.  -After he was initiated on basal insulin, he documented near target blood glucose profile.  Patient recently  came with increasing A1c of 7.6% from   6.9%.  - Recent labs reviewed.  - I have re-counseled the patient on diet management and  and weight loss by adopting a carbohydrate restricted / protein rich  Diet.  -  Suggestion is made for him to avoid simple carbohydrates  from his diet including Cakes, Sweet Desserts / Pastries, Ice Cream, Soda (diet and regular), Sweet Tea, Candies, Chips, Cookies, Store Bought Juices, Alcohol in Excess of  1-2 drinks a day, Artificial Sweeteners, and "Sugar-free" Products. This will help patient to have stable blood glucose profile and potentially avoid unintended weight gain.  - Patient is advised to stick to a routine mealtimes to eat 3 meals  a day and avoid unnecessary snacks ( to snack only to correct hypoglycemia).  - I have approached patient with the following individualized plan to manage diabetes and patient agrees.  -He has not responded  to the basal insulin.   -I encouraged him to continue Tresiba 20 units nightly associated with monitoring of blood glucose 2 times a day-daily before breakfast and at bedtime.    - I will continue with Victoza 1.8 mg daily . -He has history of metformin intolerance, however he wishes to retry.  I discussed and initiated metformin 500 mg p.o. twice daily after meals.  - Patient specific target  for A1c; LDL, HDL, Triglycerides, and  Waist Circumference were discussed in detail.  2) BP/HTN: His blood pressure is controlled to target.  I advised him to continue current medications including ACEI/ARB. 3) Lipids/HPL: His lipid panels revealed uncontrolled LDL at 125.  I advised him to continue pravastatin 40 mg p.o. nightly. 4)  Weight/Diet: No significant success in weight control,, CDE consult in progress, exercise, and carbohydrates information provided.  5) Chronic Care/Health Maintenance:  -Patient is on ACEI/ARB and Statin medications and encouraged to continue to follow up with Ophthalmology, Podiatrist at least yearly or according to recommendations, and advised to  stay away from smoking. I have recommended yearly flu vaccine and pneumonia vaccination at least every 5 years; moderate intensity exercise for up to 150 minutes weekly; and  sleep for at least 7 hours a day.  - I advised patient to maintain close follow up with Louie Bostonapper, David B., MD for primary care needs.  - Time spent with the patient: 25 min, of which >50% was spent in reviewing his blood glucose logs , discussing his hypo- and hyper-glycemic episodes, reviewing his current and  previous labs and insulin doses and developing a plan to avoid hypo- and hyper-glycemia. Please refer to Patient Instructions for Blood Glucose Monitoring  and Insulin/Medications Dosing Guide"  in media tab for additional information.   Follow up plan: -Return in about 3 months (around 06/10/2018) for meter, and logs.  Marquis Lunch, MD Phone:  [phone]  Fax: [phone]  -  This note was partially dictated with voice recognition software. Similar sounding words can be transcribed inadequately or may not  be corrected upon review.  03/10/2018, 1:03 PM

## 2018-03-18 ENCOUNTER — Other Ambulatory Visit: Payer: Self-pay | Admitting: "Endocrinology

## 2018-04-15 ENCOUNTER — Other Ambulatory Visit: Payer: Self-pay | Admitting: "Endocrinology

## 2018-04-16 ENCOUNTER — Telehealth: Payer: Self-pay | Admitting: Orthopedic Surgery

## 2018-04-16 NOTE — Telephone Encounter (Signed)
Patient called (voice message left) - requests appointment with Dr Romeo AppleHarrison. States on message he had spoken with Dr Romeo AppleHarrison today.  Called back to patient regarding scheduling, reached voice mail; left message to return call.

## 2018-04-16 NOTE — Telephone Encounter (Signed)
Patient returned call and has scheduled appointment per their conversation.  Wanted Dr Romeo AppleHarrison to know he has called and has set up appointment.

## 2018-05-13 DIAGNOSIS — M170 Bilateral primary osteoarthritis of knee: Secondary | ICD-10-CM | POA: Insufficient documentation

## 2018-05-19 ENCOUNTER — Other Ambulatory Visit: Payer: Self-pay | Admitting: "Endocrinology

## 2018-05-20 ENCOUNTER — Encounter

## 2018-05-20 ENCOUNTER — Ambulatory Visit: Payer: BLUE CROSS/BLUE SHIELD | Admitting: Orthopedic Surgery

## 2018-06-03 LAB — COMPLETE METABOLIC PANEL WITH GFR
AG Ratio: 1.2 (calc) (ref 1.0–2.5)
ALBUMIN MSPROF: 4.4 g/dL (ref 3.6–5.1)
ALT: 21 U/L (ref 9–46)
AST: 14 U/L (ref 10–35)
Alkaline phosphatase (APISO): 91 U/L (ref 40–115)
BILIRUBIN TOTAL: 0.4 mg/dL (ref 0.2–1.2)
BUN: 19 mg/dL (ref 7–25)
CALCIUM: 9.8 mg/dL (ref 8.6–10.3)
CO2: 31 mmol/L (ref 20–32)
Chloride: 101 mmol/L (ref 98–110)
Creat: 1.05 mg/dL (ref 0.70–1.25)
GFR, EST AFRICAN AMERICAN: 87 mL/min/{1.73_m2} (ref >=60)
GFR, EST NON AFRICAN AMERICAN: 75 mL/min/{1.73_m2} (ref >=60)
GLOBULIN: 3.6 g/dL (ref 1.9–3.7)
Glucose, Bld: 94 mg/dL (ref 65–99)
Potassium: 4 mmol/L (ref 3.5–5.3)
Sodium: 139 mmol/L (ref 135–146)
TOTAL PROTEIN: 8 g/dL (ref 6.1–8.1)

## 2018-06-03 LAB — HEMOGLOBIN A1C
Hgb A1c MFr Bld: 6.9 % of total Hgb — ABNORMAL HIGH (ref <5.7)
Mean Plasma Glucose: 151 (calc)
eAG (mmol/L): 8.4 (calc)

## 2018-06-16 ENCOUNTER — Ambulatory Visit (INDEPENDENT_AMBULATORY_CARE_PROVIDER_SITE_OTHER): Payer: BLUE CROSS/BLUE SHIELD | Admitting: "Endocrinology

## 2018-06-16 ENCOUNTER — Encounter: Payer: Self-pay | Admitting: "Endocrinology

## 2018-06-16 VITALS — BP 145/86 | HR 76 | Ht 69.0 in | Wt 263.0 lb

## 2018-06-16 DIAGNOSIS — I10 Essential (primary) hypertension: Secondary | ICD-10-CM | POA: Diagnosis not present

## 2018-06-16 DIAGNOSIS — E782 Mixed hyperlipidemia: Secondary | ICD-10-CM | POA: Diagnosis not present

## 2018-06-16 DIAGNOSIS — E1165 Type 2 diabetes mellitus with hyperglycemia: Secondary | ICD-10-CM | POA: Diagnosis not present

## 2018-06-16 MED ORDER — INSULIN PEN NEEDLE 31G X 8 MM MISC: 1.0000 | 3 refills | Status: DC

## 2018-06-16 MED ORDER — INSULIN DEGLUDEC 200 UNIT/ML ~~LOC~~ SOPN: 20.0000 [IU] | PEN_INJECTOR | Freq: Every day | SUBCUTANEOUS | 2 refills | Status: DC

## 2018-06-16 NOTE — Progress Notes (Signed)
Subjective:    Patient ID: Calvin Shields, male    DOB: 10/31/1956, PCP Louie Boston., MD   Past Medical History:  Diagnosis Date  . Diabetes mellitus without complication (HCC)   . Hyperlipidemia   . Hypertension    Past Surgical History:  Procedure Laterality Date  . AMPUTATION Left 09/08/2017   Procedure: PARTIAL TOE AMPUTATION LEFT HALLUX;  Surgeon: Erskine Emery, DPM;  Location: [facility];  Service: Podiatry;  Laterality: Left;  . right leg surgery      Social History   Socioeconomic History  . Marital status: Married    Spouse name: Not on file  . Number of children: Not on file  . Years of education: Not on file  . Highest education level: Not on file  Occupational History  . Not on file  Social Needs  . Financial resource strain: Not on file  . Food insecurity:    Worry: Not on file    Inability: Not on file  . Transportation needs:    Medical: Not on file    Non-medical: Not on file  Tobacco Use  . Smoking status: Never Smoker  . Smokeless tobacco: Never Used  Substance and Sexual Activity  . Alcohol use: No  . Drug use: No  . Sexual activity: Not on file  Lifestyle  . Physical activity:    Days per week: Not on file    Minutes per session: Not on file  . Stress: Not on file  Relationships  . Social connections:    Talks on phone: Not on file    Gets together: Not on file    Attends religious service: Not on file    Active member of club or organization: Not on file    Attends meetings of clubs or organizations: Not on file    Relationship status: Not on file  Other Topics Concern  . Not on file  Social History Narrative  . Not on file   Outpatient Encounter Medications as of 06/16/2018  Medication Sig  . ACCU-CHEK FASTCLIX LANCETS MISC Use to test blood glucose 2 times a day.  . allopurinol (ZYLOPRIM) 100 MG tablet Take 100 mg by mouth daily.  Marland Kitchen aspirin EC 81 MG tablet Take 81 mg by mouth daily.  Marland Kitchen glucose blood (ACCU-CHEK GUIDE)  test strip Use as instructed bid  . glucose blood test strip 1 each 2 (two) times daily by Other route. Use as instructed bid. E11.65  . ibuprofen (ADVIL,MOTRIN) 800 MG tablet Take 800 mg by mouth every 8 (eight) hours as needed.  . Insulin Degludec (TRESIBA FLEXTOUCH) 200 UNIT/ML SOPN Inject 20 Units into the skin at bedtime.  . Insulin Pen Needle (B-D ULTRAFINE III SHORT PEN) 31G X 8 MM MISC 1 each by Does not apply route as directed.  Marland Kitchen lisinopril-hydrochlorothiazide (PRINZIDE,ZESTORETIC) 20-25 MG per tablet Take 1 tablet by mouth daily.  . metFORMIN (GLUCOPHAGE) 500 MG tablet Take 1 tablet (500 mg total) by mouth 2 (two) times daily with a meal.  . Multiple Vitamin (MULTIVITAMIN WITH MINERALS) TABS tablet Take 1 tablet by mouth daily.  . pravastatin (PRAVACHOL) 40 MG tablet TAKE (1) TABLET BY MOUTH ONCE DAILY.  Marland Kitchen VICTOZA 18 MG/3ML SOPN INJECT 1.8 MG SUBQ EVERY DAY.  . [DISCONTINUED] Insulin Degludec (TRESIBA FLEXTOUCH) 200 UNIT/ML SOPN Inject 20 Units into the skin at bedtime.  . [DISCONTINUED] Insulin Pen Needle (B-D ULTRAFINE III SHORT PEN) 31G X 8 MM MISC 1 each by Does not apply  route as directed.   No facility-administered encounter medications on file as of 06/16/2018.    ALLERGIES: Allergies  Allergen Reactions  . Codeine Nausea And Vomiting   VACCINATION STATUS:  There is no immunization history on file for this patient.  Diabetes  He presents for his follow-up diabetic visit. He has type 2 diabetes mellitus. His disease course has been improving. There are no hypoglycemic associated symptoms. Pertinent negatives for hypoglycemia include no confusion, headaches, pallor or seizures. Pertinent negatives for diabetes include no chest pain, no fatigue, no polydipsia, no polyphagia, no polyuria and no weakness. There are no hypoglycemic complications. Symptoms are improving. Diabetic complications include peripheral neuropathy. Risk factors for coronary artery disease include diabetes  mellitus, dyslipidemia, hypertension, male sex, obesity and sedentary lifestyle. He is compliant with treatment most of the time. His weight is increasing steadily. He is following a generally unhealthy diet. When asked about meal planning, he reported none. He has had a previous visit with a dietitian. He never participates in exercise. His breakfast blood glucose range is generally 130-140 mg/dl. His bedtime blood glucose range is generally 140-180 mg/dl. His overall blood glucose range is 140-180 mg/dl. An ACE inhibitor/angiotensin II receptor blocker is being taken. Eye exam is current.  Hyperlipidemia  This is a chronic problem. The current episode started more than 1 year ago. The problem is uncontrolled. Exacerbating diseases include diabetes and obesity. Pertinent negatives include no chest pain, myalgias or shortness of breath. Current antihyperlipidemic treatment includes statins. Compliance problems include adherence to diet.  Risk factors for coronary artery disease include diabetes mellitus, dyslipidemia, hypertension, male sex, obesity and a sedentary lifestyle.  Hypertension  This is a chronic problem. The current episode started more than 1 year ago. Pertinent negatives include no chest pain, headaches, neck pain, palpitations or shortness of breath. Risk factors for coronary artery disease include dyslipidemia, diabetes mellitus, obesity, male gender and sedentary lifestyle. Past treatments include ACE inhibitors.     Review of Systems  Constitutional: Negative for chills, fatigue, fever and unexpected weight change.  HENT: Negative for dental problem, mouth sores and trouble swallowing.   Eyes: Negative for visual disturbance.  Respiratory: Negative for cough, choking, chest tightness, shortness of breath and wheezing.   Cardiovascular: Negative for chest pain, palpitations and leg swelling.  Gastrointestinal: Negative for abdominal distention, abdominal pain, constipation, diarrhea,  nausea and vomiting.  Endocrine: Negative for polydipsia, polyphagia and polyuria.  Genitourinary: Negative for dysuria, flank pain, hematuria and urgency.  Musculoskeletal: Negative for back pain, gait problem, myalgias and neck pain.  Skin: Negative for pallor, rash and wound.  Neurological: Negative for seizures, syncope, weakness, numbness and headaches.  Psychiatric/Behavioral: Negative.  Negative for confusion and dysphoric mood.    Objective:    BP (!) 145/86   Pulse 76   Ht 5\' 9"  (1.753 m)   Wt 263 lb (119.3 kg)   BMI 38.84 kg/m   Wt Readings from Last 3 Encounters:  06/16/18 263 lb (119.3 kg)  03/10/18 254 lb (115.2 kg)  02/10/18 254 lb (115.2 kg)    Physical Exam  Constitutional: He is oriented to person, place, and time. He appears well-developed. He is cooperative. No distress.  HENT:  Head: Normocephalic and atraumatic.  Eyes: EOM are normal.  Neck: Normal range of motion. Neck supple. No tracheal deviation present. No thyromegaly present.  Cardiovascular: Normal rate, S1 normal and S2 normal. Exam reveals no gallop.  No murmur heard. Pulses:      Dorsalis pedis  pulses are 1+ on the right side, and 1+ on the left side.       Posterior tibial pulses are 1+ on the right side, and 1+ on the left side.  Pulmonary/Chest: Effort normal. No respiratory distress. He has no wheezes.  Abdominal: He exhibits no distension. There is no tenderness. There is no guarding and no CVA tenderness.  Musculoskeletal: He exhibits deformity. He exhibits no edema.       Right shoulder: He exhibits no swelling and no deformity.  He has significant callus on his right toe following with a podiatrist.  Neurological: He is alert and oriented to person, place, and time. He has normal strength. No cranial nerve deficit or sensory deficit. Gait normal.  Skin: Skin is warm and dry. No rash noted. No cyanosis. Nails show no clubbing.  Psychiatric: He has a normal mood and affect. His speech is  normal. Judgment normal. Cognition and memory are normal.   Recent Results (from the past 2160 hour(s))  COMPLETE METABOLIC PANEL WITH GFR     Status: None   Collection Time: 06/02/18  8:13 AM  Result Value Ref Range   Glucose, Bld 94 65 - 99 mg/dL    Comment: .            Fasting reference interval .    BUN 19 7 - 25 mg/dL   Creat 1.611.05 0.960.70 - 0.451.25 mg/dL    Comment: For patients >63 years of age, the reference limit for Creatinine is approximately 13% higher for people identified as African-American. .    GFR, Est Non African American 75 > OR = 60 mL/min/1.8273m2   GFR, Est African American 87 > OR = 60 mL/min/1.7873m2   BUN/Creatinine Ratio NOT APPLICABLE 6 - 22 (calc)   Sodium 139 135 - 146 mmol/L   Potassium 4.0 3.5 - 5.3 mmol/L   Chloride 101 98 - 110 mmol/L   CO2 31 20 - 32 mmol/L   Calcium 9.8 8.6 - 10.3 mg/dL   Total Protein 8.0 6.1 - 8.1 g/dL   Albumin 4.4 3.6 - 5.1 g/dL   Globulin 3.6 1.9 - 3.7 g/dL (calc)   AG Ratio 1.2 1.0 - 2.5 (calc)   Total Bilirubin 0.4 0.2 - 1.2 mg/dL   Alkaline phosphatase (APISO) 91 40 - 115 U/L   AST 14 10 - 35 U/L   ALT 21 9 - 46 U/L  Hemoglobin A1c     Status: Abnormal   Collection Time: 06/02/18  8:13 AM  Result Value Ref Range   Hgb A1c MFr Bld 6.9 (H) <5.7 % of total Hgb    Comment: For someone without known diabetes, a hemoglobin A1c value of 6.5% or greater indicates that they may have  diabetes and this should be confirmed with a follow-up  test. . For someone with known diabetes, a value <7% indicates  that their diabetes is well controlled and a value  greater than or equal to 7% indicates suboptimal  control. A1c targets should be individualized based on  duration of diabetes, age, comorbid conditions, and  other considerations. . Currently, no consensus exists regarding use of hemoglobin A1c for diagnosis of diabetes for children. .    Mean Plasma Glucose 151 (calc)   eAG (mmol/L) 8.4 (calc)   Lipid Panel      Component Value Date/Time   CHOL 197 10/16/2017 0717   TRIG 141 10/16/2017 0717   HDL 47 10/16/2017 0717   CHOLHDL 4.2 10/16/2017 0717  VLDL 29 04/15/2016 0836   LDLCALC 125 (H) 10/16/2017 0717    Assessment & Plan:   1. Diabetes mellitus without complication (HCC)  - His diabetes is  complicated by peripheral neuropathy , and patient remains at a high risk for more acute and chronic complications of diabetes which include CAD, CVA, CKD, retinopathy, and neuropathy. These are all discussed in detail with the patient.  -He returns with improved glycemic profile and A1c at 6.9% improving from 7.6%, responding to the addition of basal insulin.   - Recent labs reviewed.  - I have re-counseled the patient on diet management and  and weight loss by adopting a carbohydrate restricted / protein rich  Diet.  -He has gained weight, admits to dietary indiscretion.  -  Suggestion is made for him to avoid simple carbohydrates  from his diet including Cakes, Sweet Desserts / Pastries, Ice Cream, Soda (diet and regular), Sweet Tea, Candies, Chips, Cookies, Store Bought Juices, Alcohol in Excess of  1-2 drinks a day, Artificial Sweeteners, and "Sugar-free" Products. This will help patient to have stable blood glucose profile and potentially avoid unintended weight gain.   - Patient is advised to stick to a routine mealtimes to eat 3 meals  a day and avoid unnecessary snacks ( to snack only to correct hypoglycemia).  - I have approached patient with the following individualized plan to manage diabetes and patient agrees.  -He has responded to the basal insulin, achieved control of diabetes.  -I advised him to continue Tresiba 20 units nightly associated with strict monitoring of blood glucose 2 times a day-daily before breakfast and at bedtime.    - I advised him to continue with Victoza 1.8 mg daily . -He has tolerated low-dose metformin.  I advised him to continue metformin 500 mg p.o. twice a  day after breakfast and supper.   - Patient specific target  for A1c; LDL, HDL, Triglycerides, and  Waist Circumference were discussed in detail.  2) BP/HTN: His blood pressure is not controlled to target.   I advised him to continue current medications including ACEI/ARB. 3) Lipids/HPL: His lipid panels revealed uncontrolled LDL at 125.  I advised him to continue pravastatin 40 mg p.o. Nightly.  Will have repeat fasting lipid panel before his next visit. 4)  Weight/Diet: He is gaining weight, mainly due to dietary discretion and insulin therapy.   CDE consult in progress, exercise, and carbohydrates information provided.  5) Chronic Care/Health Maintenance:  -Patient is on ACEI/ARB and Statin medications and encouraged to continue to follow up with Ophthalmology, Podiatrist at least yearly or according to recommendations, and advised to  stay away from smoking. I have recommended yearly flu vaccine and pneumonia vaccination at least every 5 years; moderate intensity exercise for up to 150 minutes weekly; and  sleep for at least 7 hours a day.  - I advised patient to maintain close follow up with Louie Boston., MD for primary care needs.  - Time spent with the patient: 25 min, of which >50% was spent in reviewing his blood glucose logs , discussing his hypo- and hyper-glycemic episodes, reviewing his current and  previous labs and insulin doses and developing a plan to avoid hypo- and hyper-glycemia. Please refer to Patient Instructions for Blood Glucose Monitoring and Insulin/Medications Dosing Guide"  in media tab for additional information. [facility] participated in the discussions, expressed understanding, and voiced agreement with the above plans.  All questions were answered to his satisfaction. he is encouraged  to contact clinic should he have any questions or concerns prior to his return visit.  Follow up plan: -Return in about 3 months (around 09/16/2018) for meter, and  logs.  Marquis Lunch, MD Phone: [phone]  Fax: [phone]  -  This note was partially dictated with voice recognition software. Similar sounding words can be transcribed inadequately or may not  be corrected upon review.  06/16/2018, 8:48 AM

## 2018-06-16 NOTE — Patient Instructions (Signed)

## 2018-08-11 ENCOUNTER — Other Ambulatory Visit: Payer: Self-pay | Admitting: "Endocrinology

## 2018-10-07 LAB — COMPLETE METABOLIC PANEL WITH GFR
AG RATIO: 1.3 (calc) (ref 1.0–2.5)
ALT: 17 U/L (ref 9–46)
AST: 14 U/L (ref 10–35)
Albumin: 4.2 g/dL (ref 3.6–5.1)
Alkaline phosphatase (APISO): 86 U/L (ref 40–115)
BUN: 13 mg/dL (ref 7–25)
CALCIUM: 10.1 mg/dL (ref 8.6–10.3)
CO2: 29 mmol/L (ref 20–32)
CREATININE: 1.05 mg/dL (ref 0.70–1.25)
Chloride: 100 mmol/L (ref 98–110)
GFR, EST AFRICAN AMERICAN: 87 mL/min/{1.73_m2} (ref >=60)
GFR, EST NON AFRICAN AMERICAN: 75 mL/min/{1.73_m2} (ref >=60)
GLOBULIN: 3.3 g/dL (ref 1.9–3.7)
Glucose, Bld: 97 mg/dL (ref 65–99)
POTASSIUM: 3.8 mmol/L (ref 3.5–5.3)
Sodium: 138 mmol/L (ref 135–146)
TOTAL PROTEIN: 7.5 g/dL (ref 6.1–8.1)
Total Bilirubin: 0.5 mg/dL (ref 0.2–1.2)

## 2018-10-07 LAB — MICROALBUMIN / CREATININE URINE RATIO
CREATININE, URINE: 70 mg/dL (ref 20–320)
Microalb Creat Ratio: 14 mcg/mg creat (ref <30)
Microalb, Ur: 1 mg/dL

## 2018-10-07 LAB — HEMOGLOBIN A1C
HEMOGLOBIN A1C: 7.5 %{Hb} — AB (ref <5.7)
Mean Plasma Glucose: 169 (calc)
eAG (mmol/L): 9.3 (calc)

## 2018-10-07 LAB — LIPID PANEL
CHOLESTEROL: 183 mg/dL (ref <200)
HDL: 45 mg/dL (ref >40)
LDL CHOLESTEROL (CALC): 114 mg/dL — AB
Non-HDL Cholesterol (Calc): 138 mg/dL (calc) — ABNORMAL HIGH (ref <130)
Total CHOL/HDL Ratio: 4.1 (calc) (ref <5.0)
Triglycerides: 128 mg/dL (ref <150)

## 2018-10-20 ENCOUNTER — Encounter: Payer: Self-pay | Admitting: "Endocrinology

## 2018-10-20 ENCOUNTER — Ambulatory Visit (INDEPENDENT_AMBULATORY_CARE_PROVIDER_SITE_OTHER): Payer: BLUE CROSS/BLUE SHIELD | Admitting: "Endocrinology

## 2018-10-20 VITALS — BP 142/82 | HR 73 | Ht 69.0 in | Wt 258.0 lb

## 2018-10-20 DIAGNOSIS — E1165 Type 2 diabetes mellitus with hyperglycemia: Secondary | ICD-10-CM

## 2018-10-20 DIAGNOSIS — E782 Mixed hyperlipidemia: Secondary | ICD-10-CM

## 2018-10-20 DIAGNOSIS — I10 Essential (primary) hypertension: Secondary | ICD-10-CM | POA: Diagnosis not present

## 2018-10-20 NOTE — Patient Instructions (Signed)

## 2018-10-20 NOTE — Progress Notes (Signed)
Endocrinology follow-up note   Subjective:    Patient ID: Calvin Shields, male    DOB: 1956-06-26, PCP Louie Boston., MD   Past Medical History:  Diagnosis Date  . Diabetes mellitus without complication (HCC)   . Hyperlipidemia   . Hypertension    Past Surgical History:  Procedure Laterality Date  . AMPUTATION Left 09/08/2017   Procedure: PARTIAL TOE AMPUTATION LEFT HALLUX;  Surgeon: Erskine Emery, DPM;  Location: [facility];  Service: Podiatry;  Laterality: Left;  . right leg surgery      Social History   Socioeconomic History  . Marital status: Married    Spouse name: Not on file  . Number of children: Not on file  . Years of education: Not on file  . Highest education level: Not on file  Occupational History  . Not on file  Social Needs  . Financial resource strain: Not on file  . Food insecurity:    Worry: Not on file    Inability: Not on file  . Transportation needs:    Medical: Not on file    Non-medical: Not on file  Tobacco Use  . Smoking status: Never Smoker  . Smokeless tobacco: Never Used  Substance and Sexual Activity  . Alcohol use: No  . Drug use: No  . Sexual activity: Not on file  Lifestyle  . Physical activity:    Days per week: Not on file    Minutes per session: Not on file  . Stress: Not on file  Relationships  . Social connections:    Talks on phone: Not on file    Gets together: Not on file    Attends religious service: Not on file    Active member of club or organization: Not on file    Attends meetings of clubs or organizations: Not on file    Relationship status: Not on file  Other Topics Concern  . Not on file  Social History Narrative  . Not on file   Outpatient Encounter Medications as of 10/20/2018  Medication Sig  . ACCU-CHEK FASTCLIX LANCETS MISC Use to test blood glucose 2 times a day.  . allopurinol (ZYLOPRIM) 100 MG tablet Take 100 mg by mouth daily.  Marland Kitchen aspirin EC 81 MG tablet Take 81 mg by mouth daily.  Marland Kitchen  glucose blood (ACCU-CHEK GUIDE) test strip Use as instructed bid  . glucose blood test strip 1 each 2 (two) times daily by Other route. Use as instructed bid. E11.65  . ibuprofen (ADVIL,MOTRIN) 800 MG tablet Take 800 mg by mouth every 8 (eight) hours as needed.  . Insulin Degludec (TRESIBA FLEXTOUCH) 200 UNIT/ML SOPN Inject 20 Units into the skin at bedtime.  . Insulin Pen Needle (B-D ULTRAFINE III SHORT PEN) 31G X 8 MM MISC 1 each by Does not apply route as directed.  Marland Kitchen lisinopril-hydrochlorothiazide (PRINZIDE,ZESTORETIC) 20-25 MG per tablet Take 1 tablet by mouth daily.  . metFORMIN (GLUCOPHAGE) 500 MG tablet TAKE 1 TABLET BY MOUTH 2 TIMES A DAY WITH MEALS.  . Multiple Vitamin (MULTIVITAMIN WITH MINERALS) TABS tablet Take 1 tablet by mouth daily.  . pravastatin (PRAVACHOL) 40 MG tablet TAKE (1) TABLET BY MOUTH ONCE DAILY.  Marland Kitchen VICTOZA 18 MG/3ML SOPN INJECT 1.8 MG SUBQ EVERY DAY.   No facility-administered encounter medications on file as of 10/20/2018.    ALLERGIES: Allergies  Allergen Reactions  . Codeine Nausea And Vomiting   VACCINATION STATUS:  There is no immunization history on file for this patient.  Diabetes  He presents for his follow-up diabetic visit. He has type 2 diabetes mellitus. His disease course has been worsening. There are no hypoglycemic associated symptoms. Pertinent negatives for hypoglycemia include no confusion, headaches, pallor or seizures. Pertinent negatives for diabetes include no chest pain, no fatigue, no polydipsia, no polyphagia, no polyuria and no weakness. There are no hypoglycemic complications. Symptoms are improving. Diabetic complications include peripheral neuropathy. Risk factors for coronary artery disease include diabetes mellitus, dyslipidemia, hypertension, male sex, obesity and sedentary lifestyle. He is compliant with treatment most of the time. His weight is fluctuating minimally. He is following a generally unhealthy diet. When asked about meal  planning, he reported none. He has had a previous visit with a dietitian. He never participates in exercise. His breakfast blood glucose range is generally 130-140 mg/dl. His bedtime blood glucose range is generally 130-140 mg/dl. His overall blood glucose range is 130-140 mg/dl. An ACE inhibitor/angiotensin II receptor blocker is being taken. Eye exam is current.  Hyperlipidemia  This is a chronic problem. The current episode started more than 1 year ago. The problem is uncontrolled. Exacerbating diseases include diabetes and obesity. Pertinent negatives include no chest pain, myalgias or shortness of breath. Current antihyperlipidemic treatment includes statins. Compliance problems include adherence to diet.  Risk factors for coronary artery disease include diabetes mellitus, dyslipidemia, hypertension, male sex, obesity and a sedentary lifestyle.  Hypertension  This is a chronic problem. The current episode started more than 1 year ago. Pertinent negatives include no chest pain, headaches, neck pain, palpitations or shortness of breath. Risk factors for coronary artery disease include dyslipidemia, diabetes mellitus, obesity, male gender and sedentary lifestyle. Past treatments include ACE inhibitors.     Review of Systems  Constitutional: Negative for chills, fatigue, fever and unexpected weight change.  HENT: Negative for dental problem, mouth sores and trouble swallowing.   Eyes: Negative for visual disturbance.  Respiratory: Negative for cough, choking, chest tightness, shortness of breath and wheezing.   Cardiovascular: Negative for chest pain, palpitations and leg swelling.  Gastrointestinal: Negative for abdominal distention, abdominal pain, constipation, diarrhea, nausea and vomiting.  Endocrine: Negative for polydipsia, polyphagia and polyuria.  Genitourinary: Negative for dysuria, flank pain, hematuria and urgency.  Musculoskeletal: Negative for back pain, gait problem, myalgias and  neck pain.  Skin: Negative for pallor, rash and wound.  Neurological: Negative for seizures, syncope, weakness, numbness and headaches.  Psychiatric/Behavioral: Negative.  Negative for confusion and dysphoric mood.    Objective:    BP (!) 142/82   Pulse 73   Ht 5\' 9"  (1.753 m)   Wt 258 lb (117 kg)   BMI 38.10 kg/m   Wt Readings from Last 3 Encounters:  10/20/18 258 lb (117 kg)  06/16/18 263 lb (119.3 kg)  03/10/18 254 lb (115.2 kg)    Physical Exam  Constitutional: He is oriented to person, place, and time. He appears well-developed. He is cooperative. No distress.  HENT:  Head: Normocephalic and atraumatic.  Eyes: EOM are normal.  Neck: Normal range of motion. Neck supple. No tracheal deviation present. No thyromegaly present.  Cardiovascular: Normal rate, S1 normal and S2 normal. Exam reveals no gallop.  No murmur heard. Pulses:      Dorsalis pedis pulses are 1+ on the right side, and 1+ on the left side.       Posterior tibial pulses are 1+ on the right side, and 1+ on the left side.  Pulmonary/Chest: Effort normal. No respiratory distress. He has no  wheezes.  Abdominal: He exhibits no distension. There is no tenderness. There is no guarding and no CVA tenderness.  Musculoskeletal: He exhibits deformity. He exhibits no edema.       Right shoulder: He exhibits no swelling and no deformity.  He has significant callus on his right toe following with a podiatrist.  Neurological: He is alert and oriented to person, place, and time. He has normal strength. No cranial nerve deficit or sensory deficit. Gait normal.  Skin: Skin is warm and dry. No rash noted. No cyanosis. Nails show no clubbing.  Psychiatric: He has a normal mood and affect. His speech is normal. Judgment normal. Cognition and memory are normal.   Recent Results (from the past 2160 hour(s))  COMPLETE METABOLIC PANEL WITH GFR     Status: None   Collection Time: 10/06/18  1:10 PM  Result Value Ref Range   Glucose,  Bld 97 65 - 99 mg/dL    Comment: .            Fasting reference interval .    BUN 13 7 - 25 mg/dL   Creat 1.61 0.96 - 0.45 mg/dL    Comment: For patients >19 years of age, the reference limit for Creatinine is approximately 13% higher for people identified as African-American. .    GFR, Est Non African American 75 > OR = 60 mL/min/1.36m2   GFR, Est African American 87 > OR = 60 mL/min/1.61m2   BUN/Creatinine Ratio NOT APPLICABLE 6 - 22 (calc)   Sodium 138 135 - 146 mmol/L   Potassium 3.8 3.5 - 5.3 mmol/L   Chloride 100 98 - 110 mmol/L   CO2 29 20 - 32 mmol/L   Calcium 10.1 8.6 - 10.3 mg/dL   Total Protein 7.5 6.1 - 8.1 g/dL   Albumin 4.2 3.6 - 5.1 g/dL   Globulin 3.3 1.9 - 3.7 g/dL (calc)   AG Ratio 1.3 1.0 - 2.5 (calc)   Total Bilirubin 0.5 0.2 - 1.2 mg/dL   Alkaline phosphatase (APISO) 86 40 - 115 U/L   AST 14 10 - 35 U/L   ALT 17 9 - 46 U/L  Hemoglobin A1c     Status: Abnormal   Collection Time: 10/06/18  1:10 PM  Result Value Ref Range   Hgb A1c MFr Bld 7.5 (H) <5.7 % of total Hgb    Comment: For someone without known diabetes, a hemoglobin A1c value of 6.5% or greater indicates that they may have  diabetes and this should be confirmed with a follow-up  test. . For someone with known diabetes, a value <7% indicates  that their diabetes is well controlled and a value  greater than or equal to 7% indicates suboptimal  control. A1c targets should be individualized based on  duration of diabetes, age, comorbid conditions, and  other considerations. . Currently, no consensus exists regarding use of hemoglobin A1c for diagnosis of diabetes for children. .    Mean Plasma Glucose 169 (calc)   eAG (mmol/L) 9.3 (calc)  Microalbumin / creatinine urine ratio     Status: None   Collection Time: 10/06/18  1:10 PM  Result Value Ref Range   Creatinine, Urine 70 20 - 320 mg/dL   Microalb, Ur 1.0 mg/dL    Comment: Reference Range Not established    Microalb Creat Ratio 14  <30 mcg/mg creat    Comment: . The ADA defines abnormalities in albumin excretion as follows: Marland Kitchen Category  Result (mcg/mg creatinine) . Normal                    <30 Microalbuminuria         30-299  Clinical albuminuria   > OR = 300 . The ADA recommends that at least two of three specimens collected within a 3-6 month period be abnormal before considering a patient to be within a diagnostic category.   Lipid panel     Status: Abnormal   Collection Time: 10/06/18  1:10 PM  Result Value Ref Range   Cholesterol 183 <200 mg/dL   HDL 45 >09 mg/dL   Triglycerides 604 <540 mg/dL   LDL Cholesterol (Calc) 114 (H) mg/dL (calc)    Comment: Reference range: <100 . Desirable range <100 mg/dL for primary prevention;   <70 mg/dL for patients with CHD or diabetic patients  with > or = 2 CHD risk factors. Marland Kitchen LDL-C is now calculated using the Martin-Hopkins  calculation, which is a validated novel method providing  better accuracy than the Friedewald equation in the  estimation of LDL-C.  Horald Pollen et al. Lenox Ahr. 9811;914(78): 2061-2068  (http://education.[facility].com/faq/FAQ164)    Total CHOL/HDL Ratio 4.1 <5.0 (calc)   Non-HDL Cholesterol (Calc) 138 (H) <130 mg/dL (calc)    Comment: For patients with diabetes plus 1 major ASCVD risk  factor, treating to a non-HDL-C goal of <100 mg/dL  (LDL-C of <29 mg/dL) is considered a therapeutic  option.    Lipid Panel     Component Value Date/Time   CHOL 183 10/06/2018 1310   TRIG 128 10/06/2018 1310   HDL 45 10/06/2018 1310   CHOLHDL 4.1 10/06/2018 1310   VLDL 29 04/15/2016 0836   LDLCALC 114 (H) 10/06/2018 1310    Assessment & Plan:   1. Diabetes mellitus without complication (HCC)  - His diabetes is  complicated by peripheral neuropathy , and patient remains at a high risk for more acute and chronic complications of diabetes which include CAD, CVA, CKD, retinopathy, and neuropathy. These are all discussed in detail with  the patient.  -He returns with higher A1c of 7.5% increasing from 6.9%.    - Recent labs reviewed.  - I have re-counseled the patient on diet management and  and weight loss by adopting a carbohydrate restricted / protein rich  Diet.  -He has gained weight, admits to dietary indiscretion.  -  Suggestion is made for him to avoid simple carbohydrates  from his diet including Cakes, Sweet Desserts / Pastries, Ice Cream, Soda (diet and regular), Sweet Tea, Candies, Chips, Cookies, Store Bought Juices, Alcohol in Excess of  1-2 drinks a day, Artificial Sweeteners, and "Sugar-free" Products. This will help patient to have stable blood glucose profile and potentially avoid unintended weight gain.   - Patient is advised to stick to a routine mealtimes to eat 3 meals  a day and avoid unnecessary snacks ( to snack only to correct hypoglycemia).  - I have approached patient with the following individualized plan to manage diabetes and patient agrees.  -His most recent fasting blood glucose readings are on target.  He is advised to continue Tresiba 20 units nightly associated with strict monitoring of blood glucose 2 times a day- daily before breakfast and at bedtime.   -He is advised to continue Victoza 1.8 mg subcutaneously daily.   -He is advised to continue metformin 500 mg ER p.o. twice daily-after breakfast and supper.    - Patient specific target  for A1c;  LDL, HDL, Triglycerides, and  Waist Circumference were discussed in detail.  2) BP/HTN: His blood pressure is not controlled to target.  He is advised to continue his current blood pressure medications including lisinopril-hydrochlorthiazide 20-25 mg p.o. daily.    3) Lipids/HPL: His recent lipid panel revealed uncontrolled LDL at 114.  He is advised to continue pravastatin 40 mg nightly.    4)  Weight/Diet:  CDE consult in progress, exercise, and carbohydrates information provided.  5) Chronic Care/Health Maintenance:  -Patient is on  ACEI/ARB and Statin medications and encouraged to continue to follow up with Ophthalmology, Podiatrist at least yearly or according to recommendations, and advised to  stay away from smoking. I have recommended yearly flu vaccine and pneumonia vaccination at least every 5 years; moderate intensity exercise for up to 150 minutes weekly; and  sleep for at least 7 hours a day.  - I advised patient to maintain close follow up with Louie Boston., MD for primary care needs.  - Time spent with the patient: 25 min, of which >50% was spent in reviewing his blood glucose logs , discussing his hypo- and hyper-glycemic episodes, reviewing his current and  previous labs and insulin doses and developing a plan to avoid hypo- and hyper-glycemia. Please refer to Patient Instructions for Blood Glucose Monitoring and Insulin/Medications Dosing Guide"  in media tab for additional information. [facility] participated in the discussions, expressed understanding, and voiced agreement with the above plans.  All questions were answered to his satisfaction. he is encouraged to contact clinic should he have any questions or concerns prior to his return visit.  Follow up plan: -Return in about 4 months (around 02/20/2019) for Meter, and Logs.  Marquis Lunch, MD Phone: [phone]  Fax: [phone]  -  This note was partially dictated with voice recognition software. Similar sounding words can be transcribed inadequately or may not  be corrected upon review.  10/20/2018, 9:46 AM

## 2018-11-18 ENCOUNTER — Other Ambulatory Visit: Payer: Self-pay | Admitting: "Endocrinology

## 2018-12-17 ENCOUNTER — Other Ambulatory Visit: Payer: Self-pay | Admitting: "Endocrinology

## 2018-12-21 IMAGING — DX DG FOOT 2V*L*
2 series · 2 of 2 positions shown · non-contrast
Comparison: None.

CLINICAL DATA: Amputation of the left hallux.

EXAM:
LEFT FOOT - 2 VIEW

[foot ap]
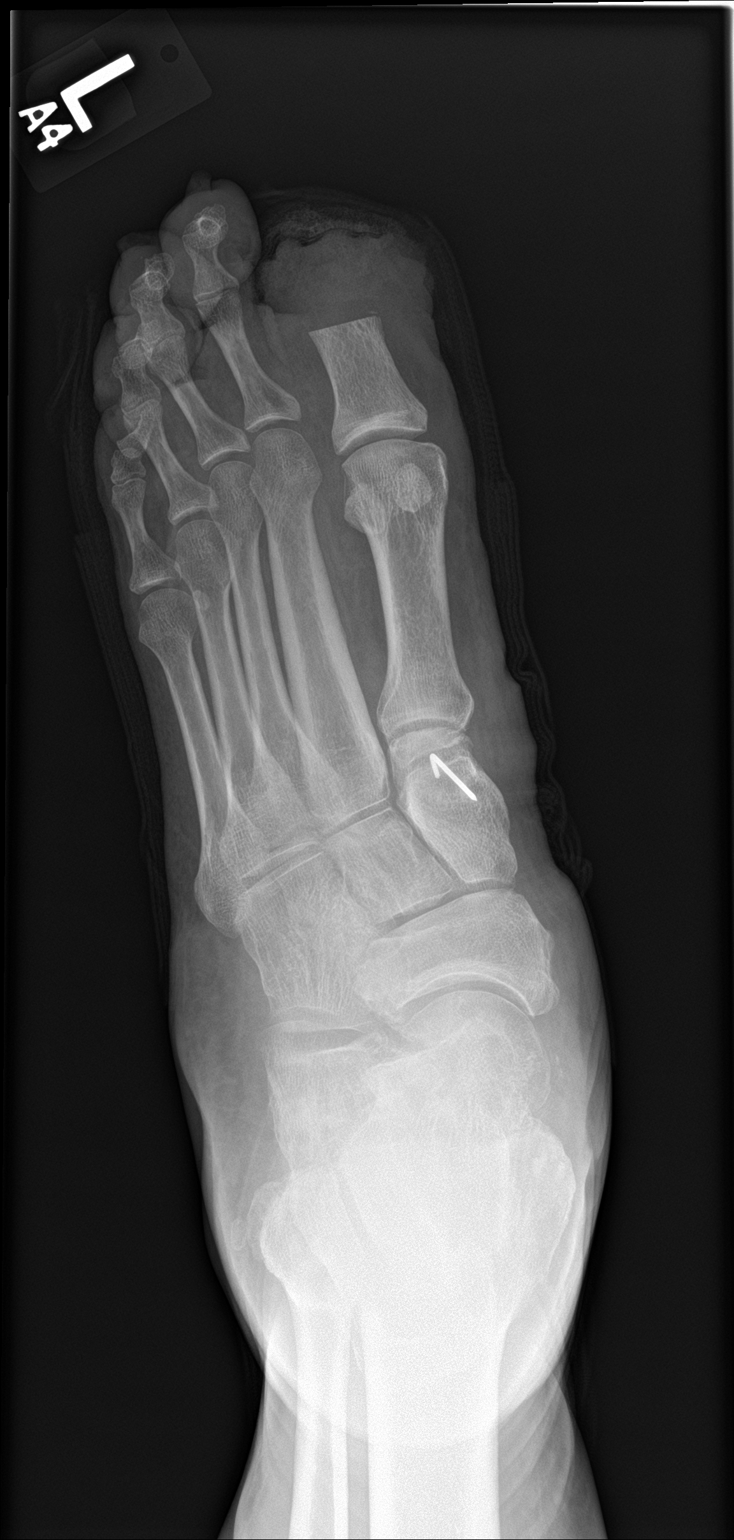

[foot lat]
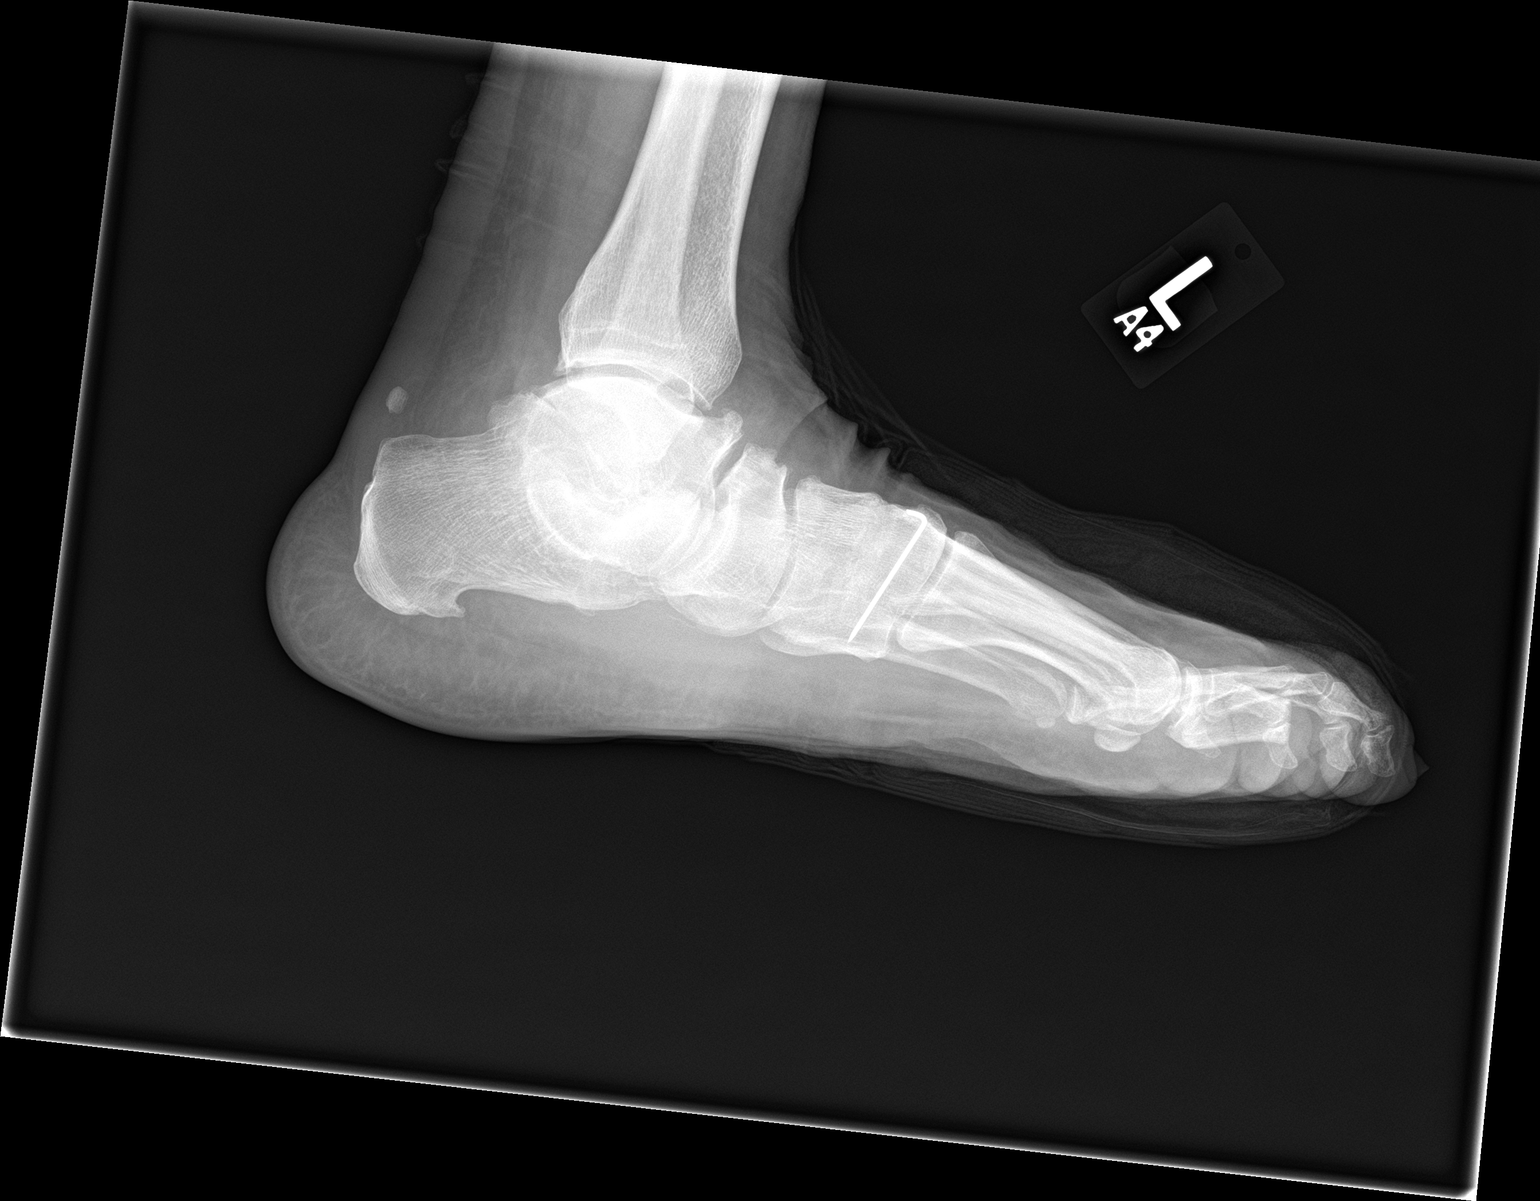

[2 of 2 positions shown; findings below may reference images not displayed]

FINDINGS: The great toe is amputated at the proximal phalanx. Soft tissue
distal to the indication is covered in a dressing. Previous surgery
at the level of the middle cuneiform is again noted. No other acute
abnormalities are present.
IMPRESSION: Amputation through the midportion of the proximal phalanx in the
great toe without radiographic evidence for complication.

## 2019-01-19 ENCOUNTER — Other Ambulatory Visit: Payer: Self-pay | Admitting: "Endocrinology

## 2019-02-17 LAB — COMPLETE METABOLIC PANEL WITH GFR
AG RATIO: 1 (calc) (ref 1.0–2.5)
ALKALINE PHOSPHATASE (APISO): 99 U/L (ref 35–144)
ALT: 22 U/L (ref 9–46)
AST: 15 U/L (ref 10–35)
Albumin: 3.9 g/dL (ref 3.6–5.1)
BILIRUBIN TOTAL: 0.3 mg/dL (ref 0.2–1.2)
BUN: 15 mg/dL (ref 7–25)
CHLORIDE: 97 mmol/L — AB (ref 98–110)
CO2: 30 mmol/L (ref 20–32)
CREATININE: 1.11 mg/dL (ref 0.70–1.25)
Calcium: 10.5 mg/dL — ABNORMAL HIGH (ref 8.6–10.3)
GFR, Est African American: 81 mL/min/{1.73_m2} (ref >=60)
GFR, Est Non African American: 70 mL/min/{1.73_m2} (ref >=60)
GLOBULIN: 3.9 g/dL — AB (ref 1.9–3.7)
Glucose, Bld: 144 mg/dL — ABNORMAL HIGH (ref 65–99)
POTASSIUM: 4.4 mmol/L (ref 3.5–5.3)
SODIUM: 137 mmol/L (ref 135–146)
Total Protein: 7.8 g/dL (ref 6.1–8.1)

## 2019-02-17 LAB — HEMOGLOBIN A1C
HEMOGLOBIN A1C: 6.9 %{Hb} — AB (ref <5.7)
MEAN PLASMA GLUCOSE: 151 (calc)
eAG (mmol/L): 8.4 (calc)

## 2019-02-23 ENCOUNTER — Ambulatory Visit: Payer: BLUE CROSS/BLUE SHIELD | Admitting: "Endocrinology

## 2019-03-02 ENCOUNTER — Other Ambulatory Visit: Payer: Self-pay | Admitting: "Endocrinology

## 2019-03-08 ENCOUNTER — Ambulatory Visit (INDEPENDENT_AMBULATORY_CARE_PROVIDER_SITE_OTHER): Payer: BLUE CROSS/BLUE SHIELD | Admitting: "Endocrinology

## 2019-03-08 ENCOUNTER — Encounter: Payer: Self-pay | Admitting: "Endocrinology

## 2019-03-08 VITALS — BP 125/75 | HR 86 | Ht 69.0 in | Wt 238.0 lb

## 2019-03-08 DIAGNOSIS — E1165 Type 2 diabetes mellitus with hyperglycemia: Secondary | ICD-10-CM

## 2019-03-08 DIAGNOSIS — E782 Mixed hyperlipidemia: Secondary | ICD-10-CM | POA: Diagnosis not present

## 2019-03-08 DIAGNOSIS — I10 Essential (primary) hypertension: Secondary | ICD-10-CM | POA: Diagnosis not present

## 2019-03-08 NOTE — Progress Notes (Signed)
Endocrinology follow-up note   Subjective:    Patient ID: Calvin Shields, male    DOB: 22-May-1956, PCP Louie Boston., MD   Past Medical History:  Diagnosis Date  . Diabetes mellitus without complication (HCC)   . Hyperlipidemia   . Hypertension    Past Surgical History:  Procedure Laterality Date  . AMPUTATION Left 09/08/2017   Procedure: PARTIAL TOE AMPUTATION LEFT HALLUX;  Surgeon: Erskine Emery, DPM;  Location: [facility];  Service: Podiatry;  Laterality: Left;  . right leg surgery       History reviewed. No pertinent family history.  Social History   Socioeconomic History  . Marital status: Married    Spouse name: Not on file  . Number of children: Not on file  . Years of education: Not on file  . Highest education level: Not on file  Occupational History  . Not on file  Social Needs  . Financial resource strain: Not on file  . Food insecurity:    Worry: Not on file    Inability: Not on file  . Transportation needs:    Medical: Not on file    Non-medical: Not on file  Tobacco Use  . Smoking status: Never Smoker  . Smokeless tobacco: Never Used  Substance and Sexual Activity  . Alcohol use: No  . Drug use: No  . Sexual activity: Not on file  Lifestyle  . Physical activity:    Days per week: Not on file    Minutes per session: Not on file  . Stress: Not on file  Relationships  . Social connections:    Talks on phone: Not on file    Gets together: Not on file    Attends religious service: Not on file    Active member of club or organization: Not on file    Attends meetings of clubs or organizations: Not on file    Relationship status: Not on file  Other Topics Concern  . Not on file  Social History Narrative  . Not on file   Outpatient Encounter Medications as of 03/08/2019  Medication Sig  . ACCU-CHEK FASTCLIX LANCETS MISC Use to test blood glucose 2 times a day.  . allopurinol (ZYLOPRIM) 100 MG tablet Take 100 mg by mouth daily.  Marland Kitchen  aspirin EC 81 MG tablet Take 81 mg by mouth daily.  Marland Kitchen glucose blood (ACCU-CHEK GUIDE) test strip Use as instructed bid  . glucose blood test strip 1 each 2 (two) times daily by Other route. Use as instructed bid. E11.65  . ibuprofen (ADVIL,MOTRIN) 800 MG tablet Take 800 mg by mouth every 8 (eight) hours as needed.  . Insulin Degludec (TRESIBA FLEXTOUCH) 200 UNIT/ML SOPN Inject 20 Units into the skin at bedtime.  . Insulin Pen Needle (B-D ULTRAFINE III SHORT PEN) 31G X 8 MM MISC 1 each by Does not apply route as directed.  Marland Kitchen lisinopril-hydrochlorothiazide (PRINZIDE,ZESTORETIC) 20-25 MG per tablet Take 1 tablet by mouth daily.  . metFORMIN (GLUCOPHAGE) 500 MG tablet TAKE 1 TABLET BY MOUTH 2 TIMES A DAY WITH MEALS.  . Multiple Vitamin (MULTIVITAMIN WITH MINERALS) TABS tablet Take 1 tablet by mouth daily.  . pravastatin (PRAVACHOL) 40 MG tablet TAKE 1 TABLET BY MOUTH ONCE DAILY.  Marland Kitchen VICTOZA 18 MG/3ML SOPN INJECT 1.8 MG SUBQ EVERY DAY.   No facility-administered encounter medications on file as of 03/08/2019.    ALLERGIES: Allergies  Allergen Reactions  . Codeine Nausea And Vomiting   VACCINATION STATUS:  There is  no immunization history on file for this patient.  Diabetes  He presents for his follow-up diabetic visit. He has type 2 diabetes mellitus. His disease course has been improving. There are no hypoglycemic associated symptoms. Pertinent negatives for hypoglycemia include no confusion, headaches, pallor or seizures. Pertinent negatives for diabetes include no chest pain, no fatigue, no polydipsia, no polyphagia, no polyuria and no weakness. There are no hypoglycemic complications. Symptoms are improving. Diabetic complications include peripheral neuropathy. Risk factors for coronary artery disease include diabetes mellitus, dyslipidemia, hypertension, male sex, obesity and sedentary lifestyle. He is compliant with treatment most of the time. His weight is fluctuating minimally. He is  following a generally unhealthy diet. When asked about meal planning, he reported none. He has had a previous visit with a dietitian. He never participates in exercise. His breakfast blood glucose range is generally 110-130 mg/dl. His bedtime blood glucose range is generally 130-140 mg/dl. His overall blood glucose range is 130-140 mg/dl. An ACE inhibitor/angiotensin II receptor blocker is being taken. Eye exam is current.  Hyperlipidemia  This is a chronic problem. The current episode started more than 1 year ago. The problem is uncontrolled. Exacerbating diseases include diabetes and obesity. Pertinent negatives include no chest pain, myalgias or shortness of breath. Current antihyperlipidemic treatment includes statins. Compliance problems include adherence to diet.  Risk factors for coronary artery disease include diabetes mellitus, dyslipidemia, hypertension, male sex, obesity and a sedentary lifestyle.  Hypertension  This is a chronic problem. The current episode started more than 1 year ago. Pertinent negatives include no chest pain, headaches, neck pain, palpitations or shortness of breath. Risk factors for coronary artery disease include dyslipidemia, diabetes mellitus, obesity, male gender and sedentary lifestyle. Past treatments include ACE inhibitors.     Review of Systems  Constitutional: Negative for chills, fatigue, fever and unexpected weight change.  HENT: Negative for dental problem, mouth sores and trouble swallowing.   Eyes: Negative for visual disturbance.  Respiratory: Negative for cough, choking, chest tightness, shortness of breath and wheezing.   Cardiovascular: Negative for chest pain, palpitations and leg swelling.  Gastrointestinal: Negative for abdominal distention, abdominal pain, constipation, diarrhea, nausea and vomiting.  Endocrine: Negative for polydipsia, polyphagia and polyuria.  Genitourinary: Negative for dysuria, flank pain, hematuria and urgency.   Musculoskeletal: Negative for back pain, gait problem, myalgias and neck pain.  Skin: Negative for pallor, rash and wound.  Neurological: Negative for seizures, syncope, weakness, numbness and headaches.  Psychiatric/Behavioral: Negative.  Negative for confusion and dysphoric mood.    Objective:    BP 125/75   Pulse 86   Ht  (1.753 m)   Wt 238 lb (108 kg)   BMI 35.15 kg/m   Wt Readings from Last 3 Encounters:  03/08/19 238 lb (108 kg)  10/20/18 258 lb (117 kg)  06/16/18 263 lb (119.3 kg)    Physical Exam  Constitutional: He is oriented to person, place, and time. He appears well-developed. He is cooperative. No distress.  HENT:  Head: Normocephalic and atraumatic.  Eyes: EOM are normal.  Neck: Normal range of motion. Neck supple. No tracheal deviation present. No thyromegaly present.  Cardiovascular: Normal rate, S1 normal and S2 normal. Exam reveals no gallop.  No murmur heard. Pulses:      Dorsalis pedis pulses are 1+ on the right side and 1+ on the left side.       Posterior tibial pulses are 1+ on the right side and 1+ on the left side.  Pulmonary/Chest:  Effort normal. No respiratory distress. He has no wheezes.  Abdominal: He exhibits no distension. There is no abdominal tenderness. There is no guarding and no CVA tenderness.  Musculoskeletal:        General: Deformity present. No edema.     Right shoulder: He exhibits no swelling and no deformity.     Comments: He has significant callus on his right toe following with a podiatrist. -He has Charcot's deformities on bilateral lower extremities. 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  Neurological: He is alert and oriented to person, place, and time. He has normal strength. No cranial nerve deficit or sensory deficit. Gait normal.  Skin: Skin is warm and dry. No rash noted. No cyanosis. Nails show no clubbing.  Psychiatric: He has a normal mood and affect. His speech is normal.  Judgment normal. Cognition and memory are normal.   Recent Results (from the past 2160 hour(s))  Hemoglobin A1c     Status: Abnormal   Collection Time: 02/16/19  7:34 AM  Result Value Ref Range   Hgb A1c MFr Bld 6.9 (H) <5.7 % of total Hgb    Comment: For someone without known diabetes, a hemoglobin A1c value of 6.5% or greater indicates that they may have  diabetes and this should be confirmed with a follow-up  test. . For someone with known diabetes, a value <7% indicates  that their diabetes is well controlled and a value  greater than or equal to 7% indicates suboptimal  control. A1c targets should be individualized based on  duration of diabetes, age, comorbid conditions, and  other considerations. . Currently, no consensus exists regarding use of hemoglobin A1c for diagnosis of diabetes for children. .    Mean Plasma Glucose 151 (calc)   eAG (mmol/L) 8.4 (calc)  COMPLETE METABOLIC PANEL WITH GFR     Status: Abnormal   Collection Time: 02/16/19  7:34 AM  Result Value Ref Range   Glucose, Bld 144 (H) 65 - 99 mg/dL    Comment: .            Fasting reference interval . For someone without known diabetes, a glucose value >125 mg/dL indicates that they may have diabetes and this should be confirmed with a follow-up test. .    BUN 15 7 - 25 mg/dL   Creat 1.61 0.96 - 0.45 mg/dL    Comment: For patients >56 years of age, the reference limit for Creatinine is approximately 13% higher for people identified as African-American. .    GFR, Est Non African American 70 > OR = 60 mL/min/1.29m2   GFR, Est African American 81 > OR = 60 mL/min/1.75m2   BUN/Creatinine Ratio NOT APPLICABLE 6 - 22 (calc)   Sodium 137 135 - 146 mmol/L   Potassium 4.4 3.5 - 5.3 mmol/L   Chloride 97 (L) 98 - 110 mmol/L   CO2 30 20 - 32 mmol/L   Calcium 10.5 (H) 8.6 - 10.3 mg/dL   Total Protein 7.8 6.1 - 8.1 g/dL   Albumin 3.9 3.6 - 5.1 g/dL   Globulin 3.9 (H) 1.9 - 3.7 g/dL (calc)   AG Ratio 1.0  1.0 - 2.5 (calc)   Total Bilirubin 0.3 0.2 - 1.2 mg/dL   Alkaline phosphatase (APISO) 99 35 - 144 U/L   AST 15 10 - 35 U/L   ALT 22 9 - 46 U/L   Lipid Panel     Component Value Date/Time   CHOL  183 10/06/2018 1310   TRIG 128 10/06/2018 1310   HDL 45 10/06/2018 1310   CHOLHDL 4.1 10/06/2018 1310   VLDL 29 04/15/2016 0836   LDLCALC 114 (H) 10/06/2018 1310    Assessment & Plan:   1. Diabetes mellitus without complication (HCC)  - His diabetes is  complicated by peripheral neuropathy , peripheral arterial disease, Charcot's deformities and patient remains at a high risk for more acute and chronic complications of diabetes which include CAD, CVA, CKD, retinopathy, and neuropathy. These are all discussed in detail with the patient.  -He is recovering from partial amputation of right related to osteomyelitis.  His previsit labs show A1c of 6.9% improving from 7.5%.     - Recent labs reviewed with him, showing normal renal function.  - I have re-counseled the patient on diet management and  and weight loss by adopting a carbohydrate restricted / protein rich  Diet.  -He has gained weight, admits to dietary indiscretion.  - Patient admits there is a room for improvement in his diet and drink choices. -  Suggestion is made for him to avoid simple carbohydrates  from his diet including Cakes, Sweet Desserts / Pastries, Ice Cream, Soda (diet and regular), Sweet Tea, Candies, Chips, Cookies, Store Bought Juices, Alcohol in Excess of  1-2 drinks a day, Artificial Sweeteners, and "Sugar-free" Products. This will help patient to have stable blood glucose profile and potentially avoid unintended weight gain.  - Patient is advised to stick to a routine mealtimes to eat 3 meals  a day and avoid unnecessary snacks ( to snack only to correct hypoglycemia).  - I have approached patient with the following individualized plan to manage diabetes and patient agrees.  -His most recent blood glucose  readings are near target both fasting and postprandial.   -While he is recovering from his recent surgery, he is advised to continue Tresiba 20 units nightly    associated with strict monitoring of blood glucose 2 times a day- daily before breakfast and at bedtime.   -He is advised to continue Victoza 1.8 mg subcutaneously daily.   -He will continue to benefit from metformin therapy, advised to continue  metformin 500 mg ER p.o. twice daily-after breakfast and supper.    - Patient specific target  for A1c; LDL, HDL, Triglycerides, and  Waist Circumference were discussed in detail.  2) BP/HTN: His blood pressure is not controlled to target.  He is advised to continue his current blood pressure medications including lisinopril-hydrochlorthiazide 20-25 mg p.o. daily.    3) Lipids/HPL: His recent lipid panel revealed uncontrolled LDL at 114.  He is advised to continue pravastatin 40 mg p.o. nightly.      4)  Weight/Diet:  CDE consult in progress, exercise, and carbohydrates information provided.  5) Chronic Care/Health Maintenance:  -Patient is on ACEI/ARB and Statin medications and encouraged to continue to follow up with Ophthalmology, Podiatrist at least yearly or according to recommendations, and advised to  stay away from smoking. I have recommended yearly flu vaccine and pneumonia vaccination at least every 5 years; moderate intensity exercise for up to 150 minutes weekly; and  sleep for at least 7 hours a day.  - I advised patient to maintain close follow up with Louie Boston., MD for primary care needs.  - Time spent with the patient: 25 min, of which >50% was spent in reviewing his blood glucose logs , discussing his hypoglycemia and hyperglycemia episodes, reviewing his current and  previous labs /  studies and medications  doses and developing a plan to avoid hypoglycemia and hyperglycemia. Please refer to Patient Instructions for Blood Glucose Monitoring and Insulin/Medications Dosing  Guide"  in media tab for additional information. Please  also refer to " Patient Self Inventory" in the Media  tab for reviewed elements of pertinent patient history.  [facility] participated in the discussions, expressed understanding, and voiced agreement with the above plans.  All questions were answered to his satisfaction. he is encouraged to contact clinic should he have any questions or concerns prior to his return visit.   Follow up plan: -Return in about 4 months (around 07/08/2019) for Meter, and Logs.  Marquis Lunch, MD Phone: [phone]  Fax: [phone]  -  This note was partially dictated with voice recognition software. Similar sounding words can be transcribed inadequately or may not  be corrected upon review.  03/08/2019, 2:06 PM

## 2019-03-08 NOTE — Patient Instructions (Signed)

## 2019-03-09 ENCOUNTER — Other Ambulatory Visit: Payer: Self-pay | Admitting: "Endocrinology

## 2019-04-03 ENCOUNTER — Other Ambulatory Visit: Payer: Self-pay | Admitting: "Endocrinology

## 2019-05-17 ENCOUNTER — Other Ambulatory Visit: Payer: Self-pay | Admitting: "Endocrinology

## 2019-06-08 LAB — COMPLETE METABOLIC PANEL WITH GFR
AG Ratio: 1.2 (calc) (ref 1.0–2.5)
ALT: 11 U/L (ref 9–46)
AST: 13 U/L (ref 10–35)
Albumin: 4.1 g/dL (ref 3.6–5.1)
Alkaline phosphatase (APISO): 93 U/L (ref 35–144)
BUN: 13 mg/dL (ref 7–25)
CALCIUM: 9.8 mg/dL (ref 8.6–10.3)
CO2: 30 mmol/L (ref 20–32)
CREATININE: 0.9 mg/dL (ref 0.70–1.25)
Chloride: 101 mmol/L (ref 98–110)
GFR, Est African American: 104 mL/min/{1.73_m2} (ref >=60)
GFR, Est Non African American: 90 mL/min/{1.73_m2} (ref >=60)
Globulin: 3.5 g/dL (calc) (ref 1.9–3.7)
Glucose, Bld: 79 mg/dL (ref 65–99)
Potassium: 4.1 mmol/L (ref 3.5–5.3)
Sodium: 139 mmol/L (ref 135–146)
Total Bilirubin: 0.5 mg/dL (ref 0.2–1.2)
Total Protein: 7.6 g/dL (ref 6.1–8.1)

## 2019-06-08 LAB — HEMOGLOBIN A1C
Hgb A1c MFr Bld: 6.2 % of total Hgb — ABNORMAL HIGH (ref <5.7)
Mean Plasma Glucose: 131 (calc)
eAG (mmol/L): 7.3 (calc)

## 2019-06-08 LAB — PTH, INTACT AND CALCIUM
CALCIUM: 9.8 mg/dL (ref 8.6–10.3)
PTH: 21 pg/mL (ref 14–64)

## 2019-06-08 LAB — T4, FREE: Free T4: 0.9 ng/dL (ref 0.8–1.8)

## 2019-06-08 LAB — TSH: TSH: 0.95 m[IU]/L (ref 0.40–4.50)

## 2019-06-08 LAB — VITAMIN D 25 HYDROXY (VIT D DEFICIENCY, FRACTURES): Vit D, 25-Hydroxy: 32 ng/mL (ref 30–100)

## 2019-06-25 ENCOUNTER — Encounter: Payer: Self-pay | Admitting: "Endocrinology

## 2019-06-25 LAB — HM DIABETES EYE EXAM

## 2019-06-29 ENCOUNTER — Ambulatory Visit (INDEPENDENT_AMBULATORY_CARE_PROVIDER_SITE_OTHER): Payer: BC Managed Care – PPO | Admitting: "Endocrinology

## 2019-06-29 ENCOUNTER — Other Ambulatory Visit: Payer: Self-pay

## 2019-06-29 ENCOUNTER — Encounter: Payer: Self-pay | Admitting: "Endocrinology

## 2019-06-29 ENCOUNTER — Ambulatory Visit: Payer: BLUE CROSS/BLUE SHIELD | Admitting: "Endocrinology

## 2019-06-29 DIAGNOSIS — E782 Mixed hyperlipidemia: Secondary | ICD-10-CM

## 2019-06-29 DIAGNOSIS — E1165 Type 2 diabetes mellitus with hyperglycemia: Secondary | ICD-10-CM

## 2019-06-29 DIAGNOSIS — I10 Essential (primary) hypertension: Secondary | ICD-10-CM

## 2019-06-29 NOTE — Progress Notes (Signed)
06/29/2019                                                    Endocrinology Telehealth Visit Follow up Note -During COVID -19 Pandemic  This visit type was conducted due to national recommendations for restrictions regarding the COVID-19 Pandemic  in an effort to limit this patient's exposure and mitigate transmission of the corona virus.  Due to his co-morbid illnesses, Gilmore List is at  moderate to high risk for complications without adequate follow up.  This format is felt to be most appropriate for him at this time.  I connected with this patient on 06/29/2019   by telephone and verified that I am speaking with the correct person using two identifiers. [facility], 05-31-1964. he has verbally consented to this visit. All issues noted in this document were discussed and addressed. The format was not optimal for physical exam.    Subjective:    Patient ID: [name], male    DOB: [birth year], PCP Louie Boston., MD   Past Medical History:  Diagnosis Date  . Diabetes mellitus without complication (HCC)   . Hyperlipidemia   . Hypertension    Past Surgical History:  Procedure Laterality Date  . AMPUTATION Left 09/08/2017   Procedure: PARTIAL TOE AMPUTATION LEFT HALLUX;  Surgeon: Erskine Emery, DPM;  Location: [facility];  Service: Podiatry;  Laterality: Left;  . right leg surgery       History reviewed. No pertinent family history.  Social History   Socioeconomic History  . Marital status: Married    Spouse name: Not on file  . Number of children: Not on file  . Years of education: Not on file  . Highest education level: Not on file  Occupational History  . Not on file  Social Needs  . Financial resource strain: Not on file  . Food insecurity    Worry: Not on file    Inability: Not on file  . Transportation needs    Medical: Not on file    Non-medical: Not on file  Tobacco Use  . Smoking status: Never Smoker  . Smokeless tobacco: Never Used   Substance and Sexual Activity  . Alcohol use: No  . Drug use: No  . Sexual activity: Not on file  Lifestyle  . Physical activity    Days per week: Not on file    Minutes per session: Not on file  . Stress: Not on file  Relationships  . Social Musician on phone: Not on file    Gets together: Not on file    Attends religious service: Not on file    Active member of club or organization: Not on file    Attends meetings of clubs or organizations: Not on file    Relationship status: Not on file  Other Topics Concern  . Not on file  Social History Narrative  . Not on file   Outpatient Encounter Medications as of 06/29/2019  Medication Sig  . ACCU-CHEK FASTCLIX LANCETS MISC Use to test blood glucose 2 times a day.  . allopurinol (ZYLOPRIM) 100 MG tablet Take 100 mg by mouth daily.  Marland Kitchen aspirin EC 81 MG tablet Take 81 mg by mouth daily.  . CONTOUR NEXT TEST test strip USE TO TEST BLOOD SUGAR TWICE DAILY AS DIRECTED.  Marland Kitchen  glucose blood (ACCU-CHEK GUIDE) test strip Use as instructed bid  . ibuprofen (ADVIL,MOTRIN) 800 MG tablet Take 800 mg by mouth every 8 (eight) hours as needed.  . Insulin Pen Needle (B-D ULTRAFINE III SHORT PEN) 31G X 8 MM MISC 1 each by Does not apply route as directed.  Marland Kitchen. lisinopril-hydrochlorothiazide (PRINZIDE,ZESTORETIC) 20-25 MG per tablet Take 1 tablet by mouth daily.  . metFORMIN (GLUCOPHAGE) 500 MG tablet TAKE 1 TABLET BY MOUTH 2 TIMES A DAY WITH MEALS.  . Multiple Vitamin (MULTIVITAMIN WITH MINERALS) TABS tablet Take 1 tablet by mouth daily.  . pravastatin (PRAVACHOL) 40 MG tablet TAKE 1 TABLET ONCE DAILY.  Marland Kitchen. TRESIBA FLEXTOUCH 200 UNIT/ML SOPN INJECT 20 UNITS AT BEDTIME.  Marland Kitchen. VICTOZA 18 MG/3ML SOPN INJECT 1.8 MG SUBQ EVERY DAY.   No facility-administered encounter medications on file as of 06/29/2019.    ALLERGIES: Allergies  Allergen Reactions  . Codeine Nausea And Vomiting   VACCINATION STATUS:  There is no immunization history on file for  this patient.  Diabetes He presents for his follow-up diabetic visit. He has type 2 diabetes mellitus. His disease course has been stable. There are no hypoglycemic associated symptoms. Pertinent negatives for hypoglycemia include no confusion, headaches, pallor or seizures. Pertinent negatives for diabetes include no chest pain, no fatigue, no polydipsia, no polyphagia, no polyuria and no weakness. There are no hypoglycemic complications. Symptoms are stable. Diabetic complications include peripheral neuropathy. Risk factors for coronary artery disease include diabetes mellitus, dyslipidemia, hypertension, male sex, obesity and sedentary lifestyle. He is compliant with treatment most of the time. His weight is fluctuating minimally. He is following a generally unhealthy diet. When asked about meal planning, he reported none. He has had a previous visit with a dietitian. He never participates in exercise. His home blood glucose trend is fluctuating minimally. His breakfast blood glucose range is generally 110-130 mg/dl. His bedtime blood glucose range is generally 130-140 mg/dl. His overall blood glucose range is 130-140 mg/dl. An ACE inhibitor/angiotensin II receptor blocker is being taken. Eye exam is current.  Hyperlipidemia This is a chronic problem. The current episode started more than 1 year ago. The problem is uncontrolled. Exacerbating diseases include diabetes and obesity. Pertinent negatives include no chest pain, myalgias or shortness of breath. Current antihyperlipidemic treatment includes statins. Compliance problems include adherence to diet.  Risk factors for coronary artery disease include diabetes mellitus, dyslipidemia, hypertension, male sex, obesity and a sedentary lifestyle.  Hypertension This is a chronic problem. The current episode started more than 1 year ago. Pertinent negatives include no chest pain, headaches, neck pain, palpitations or shortness of breath. Risk factors for  coronary artery disease include dyslipidemia, diabetes mellitus, obesity, male gender and sedentary lifestyle. Past treatments include ACE inhibitors.     Review of Systems  Constitutional: Negative for chills, fatigue, fever and unexpected weight change.  HENT: Negative for dental problem, mouth sores and trouble swallowing.   Eyes: Negative for visual disturbance.  Respiratory: Negative for cough, choking, chest tightness, shortness of breath and wheezing.   Cardiovascular: Negative for chest pain, palpitations and leg swelling.  Gastrointestinal: Negative for abdominal distention, abdominal pain, constipation, diarrhea, nausea and vomiting.  Endocrine: Negative for polydipsia, polyphagia and polyuria.  Genitourinary: Negative for dysuria, flank pain, hematuria and urgency.  Musculoskeletal: Negative for back pain, gait problem, myalgias and neck pain.  Skin: Negative for pallor, rash and wound.  Neurological: Negative for seizures, syncope, weakness, numbness and headaches.  Psychiatric/Behavioral: Negative.  Negative for confusion  and dysphoric mood.    Objective:    There were no vitals taken for this visit.  Wt Readings from Last 3 Encounters:  03/08/19 238 lb (108 kg)  10/20/18 258 lb (117 kg)  06/16/18 263 lb (119.3 kg)     Recent Results (from the past 2160 hour(s))  Hemoglobin A1c     Status: Abnormal   Collection Time: 06/07/19  7:06 AM  Result Value Ref Range   Hgb A1c MFr Bld 6.2 (H) <5.7 % of total Hgb    Comment: For someone without known diabetes, a hemoglobin  A1c value between 5.7% and 6.4% is consistent with prediabetes and should be confirmed with a  follow-up test. . For someone with known diabetes, a value <7% indicates that their diabetes is well controlled. A1c targets should be individualized based on duration of diabetes, age, comorbid conditions, and other considerations. . This assay result is consistent with an increased risk of  diabetes. . Currently, no consensus exists regarding use of hemoglobin A1c for diagnosis of diabetes for children. .    Mean Plasma Glucose 131 (calc)   eAG (mmol/L) 7.3 (calc)  COMPLETE METABOLIC PANEL WITH GFR     Status: None   Collection Time: 06/07/19  7:06 AM  Result Value Ref Range   Glucose, Bld 79 65 - 99 mg/dL    Comment: .            Fasting reference interval .    BUN 13 7 - 25 mg/dL   Creat 6.040.90 5.400.70 - 9.811.25 mg/dL    Comment: For patients >64 years of age, the reference limit for Creatinine is approximately 13% higher for people identified as African-American. .    GFR, Est Non African American 90 > OR = 60 mL/min/1.2273m2   GFR, Est African American 104 > OR = 60 mL/min/1.5773m2   BUN/Creatinine Ratio NOT APPLICABLE 6 - 22 (calc)   Sodium 139 135 - 146 mmol/L   Potassium 4.1 3.5 - 5.3 mmol/L   Chloride 101 98 - 110 mmol/L   CO2 30 20 - 32 mmol/L   Calcium 9.8 8.6 - 10.3 mg/dL   Total Protein 7.6 6.1 - 8.1 g/dL   Albumin 4.1 3.6 - 5.1 g/dL   Globulin 3.5 1.9 - 3.7 g/dL (calc)   AG Ratio 1.2 1.0 - 2.5 (calc)   Total Bilirubin 0.5 0.2 - 1.2 mg/dL   Alkaline phosphatase (APISO) 93 35 - 144 U/L   AST 13 10 - 35 U/L   ALT 11 9 - 46 U/L  PTH, intact and calcium     Status: None   Collection Time: 06/07/19  7:06 AM  Result Value Ref Range   PTH 21 14 - 64 pg/mL    Comment: . Interpretive Guide    Intact PTH           Calcium ------------------    ----------           ------- Normal Parathyroid    Normal               Normal Hypoparathyroidism    Low or Low Normal    Low Hyperparathyroidism    Primary            Normal or High       High    Secondary          High                 Normal or  Low    Tertiary           High                 High Non-Parathyroid    Hypercalcemia      Low or Low Normal    High .    Calcium 9.8 8.6 - 10.3 mg/dL  T4, free     Status: None   Collection Time: 06/07/19  7:06 AM  Result Value Ref Range   Free T4 0.9 0.8 - 1.8 ng/dL   TSH     Status: None   Collection Time: 06/07/19  7:06 AM  Result Value Ref Range   TSH 0.95 0.40 - 4.50 mIU/L  VITAMIN D 25 Hydroxy (Vit-D Deficiency, Fractures)     Status: None   Collection Time: 06/07/19  7:06 AM  Result Value Ref Range   Vit D, 25-Hydroxy 32 30 - 100 ng/mL    Comment: Vitamin D Status         25-OH Vitamin D: . Deficiency:                    <20 ng/mL Insufficiency:             20 - 29 ng/mL Optimal:                 > or = 30 ng/mL . For 25-OH Vitamin D testing on patients on  D2-supplementation and patients for whom quantitation  of D2 and D3 fractions is required, the [facility](TM) 25-OH VIT D, (D2,D3), LC/MS/MS is recommended: order  code [id number] (patients >32yrs). See Note 1 . Note 1 . For additional information, please refer to  http://education.[facility].com/faq/FAQ199  (This link is being provided for informational/ educational purposes only.)    Lipid Panel     Component Value Date/Time   CHOL 183 10/06/2018 1310   TRIG 128 10/06/2018 1310   HDL 45 10/06/2018 1310   CHOLHDL 4.1 10/06/2018 1310   VLDL 29 04/15/2016 0836   LDLCALC 114 (H) 10/06/2018 1310    Assessment & Plan:   1. Diabetes mellitus without complication ([place])  - His diabetes is  complicated by peripheral neuropathy , peripheral arterial disease, Charcot's deformities and patient remains at a high risk for more acute and chronic complications of diabetes which include CAD, CVA, CKD, retinopathy, and neuropathy. These are all discussed in detail with the patient.  -He reports near target glycemic profile both fasting and postprandial.  His previsit labs show A1c of 6.2% improving from 7.5%.   - Recent labs reviewed with him, showing normal renal function.  - I have re-counseled the patient on diet management and  and weight loss by adopting a carbohydrate restricted / protein rich  Diet.  -He has gained weight, admits to dietary indiscretion.  - he  admits there  is a room for improvement in his diet and drink choices. -  Suggestion is made for him to avoid simple carbohydrates  from his diet including Cakes, Sweet Desserts / Pastries, Ice Cream, Soda (diet and regular), Sweet Tea, Candies, Chips, Cookies, Sweet Pastries,  Store Bought Juices, Alcohol in Excess of  1-2 drinks a day, Artificial Sweeteners, Coffee Creamer, and "Sugar-free" Products. This will help patient to have stable blood glucose profile and potentially avoid unintended weight gain.   - Patient is advised to stick to a routine mealtimes to eat 3 meals  a day and avoid unnecessary snacks ( to snack only to correct  hypoglycemia).  - I have approached patient with the following individualized plan to manage diabetes and patient agrees.  -His most recent blood glucose readings are near target both fasting and postprandial.   -He has responded to his basal insulin.  He is advised to continue Mon[place]iba  Tresiba 20 units nightly    associated with strict monitoring of blood glucose 2 times a day- daily before breakfast and at bedtime.   -He is advised to continue Victoza 1.8 mg subcutaneously daily.   -He will continue to benefit from metformin therapy, advised to continue  metformin 500 mg ER p.o. twice daily-after breakfast and supper.    - Patient specific target  for A1c; LDL, HDL, Triglycerides, and  Waist Circumference were discussed in detail.  2) BP/HTN: he is advised to home monitor blood pressure and report if > 140/90 on 2 separate readings.   He is advised to continue his current blood pressure medications including lisinopril-hydrochlorthiazide 20-25 mg p.o. daily.    3) Lipids/HPL: His recent lipid panel revealed uncontrolled LDL at 114.  He is advised to continue pravastatin 40 mg p.o. nightly.  Side effects and precautions discussed with him.   4)  Weight/Diet:  CDE consult in progress, exercise, and carbohydrates information provided.  5) Chronic Care/Health  Maintenance:  -Patient is on ACEI/ARB and Statin medications and encouraged to continue to follow up with Ophthalmology, Podiatrist at least yearly or according to recommendations, and advised to  stay away from smoking. I have recommended yearly flu vaccine and pneumonia vaccination at least every 5 years; moderate intensity exercise for up to 150 minutes weekly; and  sleep for at least 7 hours a day.  - I advised patient to maintain close follow up with Louie Bostonapper, David B., MD for primary care needs.      - Patient Care Time Today:  25 min, of which >50% was spent in reviewing his  current and  previous labs/studies, previous treatments, his blood glucose readings, and medications doses and developing a plan for long-term care based on the latest recommendations for standards of care.  [facility] participated in the discussions, expressed understanding, and voiced agreement with the above plans.  All questions were answered to his satisfaction. he is encouraged to contact clinic should he have any questions or concerns prior to his return visit.   Follow up plan: -Return in about 6 months (around 12/29/2019) for Follow up with Pre-visit Labs, Meter, and Logs.  Marquis LunchGebre Abisai Deer, MD Phone: [phone]  Fax: [phone]  -  This note was partially dictated with voice recognition software. Similar sounding words can be transcribed inadequately or may not  be corrected upon review.  06/29/2019, 1:52 PM

## 2019-07-19 ENCOUNTER — Telehealth: Payer: Self-pay | Admitting: "Endocrinology

## 2019-07-19 NOTE — Telephone Encounter (Signed)
Patient left a VM that he would like something else called in for his insulin. His Victoza and Georgina Quint has gotten to expensive.

## 2019-07-19 NOTE — Telephone Encounter (Signed)
He can get some samples ( insulin and Ozempic 0.5mg  weekly) from here, and will discuss his options during his next visit.

## 2019-07-20 NOTE — Telephone Encounter (Signed)
Left message for pt to call back  °

## 2019-07-21 NOTE — Telephone Encounter (Signed)
Left message

## 2019-07-22 NOTE — Telephone Encounter (Signed)
Left message

## 2019-07-23 ENCOUNTER — Telehealth: Payer: Self-pay | Admitting: "Endocrinology

## 2019-07-23 NOTE — Telephone Encounter (Signed)
Pt states he will call his insurance to see if anything is cheaper than the [place] and Victoza. He is unable to come to office and pick up samples

## 2019-09-10 ENCOUNTER — Other Ambulatory Visit: Payer: Self-pay | Admitting: "Endocrinology

## 2019-09-21 ENCOUNTER — Other Ambulatory Visit: Payer: Self-pay | Admitting: "Endocrinology

## 2019-11-13 ENCOUNTER — Other Ambulatory Visit: Payer: Self-pay | Admitting: "Endocrinology

## 2019-12-20 LAB — LIPID PANEL
Cholesterol: 163 (ref 0–200)
HDL: 50 (ref 35–70)
LDL Cholesterol: 92
Triglycerides: 120 (ref 40–160)

## 2019-12-20 LAB — HEMOGLOBIN A1C: Hemoglobin A1C: 5.9

## 2019-12-20 LAB — BASIC METABOLIC PANEL
BUN: 20 (ref 4–21)
Creatinine: 1.2 (ref 0.6–1.3)

## 2020-01-03 ENCOUNTER — Encounter: Payer: Self-pay | Admitting: "Endocrinology

## 2020-01-03 ENCOUNTER — Ambulatory Visit (INDEPENDENT_AMBULATORY_CARE_PROVIDER_SITE_OTHER): Payer: BC Managed Care – PPO | Admitting: "Endocrinology

## 2020-01-03 ENCOUNTER — Encounter: Payer: BC Managed Care – PPO | Admitting: "Endocrinology

## 2020-01-03 ENCOUNTER — Ambulatory Visit: Payer: BC Managed Care – PPO | Admitting: "Endocrinology

## 2020-01-03 DIAGNOSIS — I10 Essential (primary) hypertension: Secondary | ICD-10-CM | POA: Diagnosis not present

## 2020-01-03 DIAGNOSIS — E782 Mixed hyperlipidemia: Secondary | ICD-10-CM | POA: Diagnosis not present

## 2020-01-03 DIAGNOSIS — E1165 Type 2 diabetes mellitus with hyperglycemia: Secondary | ICD-10-CM

## 2020-01-03 NOTE — Progress Notes (Signed)
01/03/2020                                                    Endocrinology Telehealth Visit Follow up Note -During COVID -19 Pandemic  This visit type was conducted due to national recommendations for restrictions regarding the COVID-19 Pandemic  in an effort to limit this patient's exposure and mitigate transmission of the corona virus.  Due to his co-morbid illnesses, Calvin Shields is at  moderate to high risk for complications without adequate follow up.  This format is felt to be most appropriate for him at this time.  I connected with this patient on 01/03/2020   by telephone and verified that I am speaking with the correct person using two identifiers. [facility], 12-30-1956. he has verbally consented to this visit. All issues noted in this document were discussed and addressed. The format was not optimal for physical exam.    Subjective:    Patient ID: Calvin Shields, male    DOB: 1956/10/11, PCP Louie Boston., MD   Past Medical History:  Diagnosis Date  . Diabetes mellitus without complication (HCC)   . Hyperlipidemia   . Hypertension    Past Surgical History:  Procedure Laterality Date  . AMPUTATION Left 09/08/2017   Procedure: PARTIAL TOE AMPUTATION LEFT HALLUX;  Surgeon: Erskine Emery, DPM;  Location: [facility];  Service: Podiatry;  Laterality: Left;  . right leg surgery       No family history on file.  Social History   Socioeconomic History  . Marital status: Married    Spouse name: Not on file  . Number of children: Not on file  . Years of education: Not on file  . Highest education level: Not on file  Occupational History  . Not on file  Tobacco Use  . Smoking status: Never Smoker  . Smokeless tobacco: Never Used  Substance and Sexual Activity  . Alcohol use: No  . Drug use: No  . Sexual activity: Not on file  Other Topics Concern  . Not on file  Social History Narrative  . Not on file   Social Determinants of Health   Financial  Resource Strain:   . Difficulty of Paying Living Expenses: Not on file  Food Insecurity:   . Worried About Programme researcher, broadcasting/film/video in the Last Year: Not on file  . Ran Out of Food in the Last Year: Not on file  Transportation Needs:   . Lack of Transportation (Medical): Not on file  . Lack of Transportation (Non-Medical): Not on file  Physical Activity:   . Days of Exercise per Week: Not on file  . Minutes of Exercise per Session: Not on file  Stress:   . Feeling of Stress : Not on file  Social Connections:   . Frequency of Communication with Friends and Family: Not on file  . Frequency of Social Gatherings with Friends and Family: Not on file  . Attends Religious Services: Not on file  . Active Member of Clubs or Organizations: Not on file  . Attends Banker Meetings: Not on file  . Marital Status: Not on file   Outpatient Encounter Medications as of 01/03/2020  Medication Sig  . ACCU-CHEK FASTCLIX LANCETS MISC Use to test blood glucose 2 times a day.  . allopurinol (ZYLOPRIM) 100 MG tablet  Take 100 mg by mouth daily.  Marland Kitchen aspirin EC 81 MG tablet Take 81 mg by mouth daily.  . CONTOUR NEXT TEST test strip USE TO TEST BLOOD SUGAR TWICE DAILY AS DIRECTED.  Marland Kitchen GLOBAL EASE INJECT PEN NEEDLES 31G X 8 MM MISC USE AS DIRECTED.  Marland Kitchen glucose blood (ACCU-CHEK GUIDE) test strip Use as instructed bid  . ibuprofen (ADVIL,MOTRIN) 800 MG tablet Take 800 mg by mouth every 8 (eight) hours as needed.  Marland Kitchen lisinopril-hydrochlorothiazide (PRINZIDE,ZESTORETIC) 20-25 MG per tablet Take 1 tablet by mouth daily.  . metFORMIN (GLUCOPHAGE) 500 MG tablet TAKE 1 TABLET BY MOUTH 2 TIMES A DAY WITH MEALS.  . Multiple Vitamin (MULTIVITAMIN WITH MINERALS) TABS tablet Take 1 tablet by mouth daily.  . pravastatin (PRAVACHOL) 40 MG tablet TAKE 1 TABLET BY MOUTH ONCE DAILY.  Marland Kitchen VICTOZA 18 MG/3ML SOPN INJECT 1.8 MG SUBQ EVERY DAY.   No facility-administered encounter medications on file as of 01/03/2020.    ALLERGIES: Allergies  Allergen Reactions  . Codeine Nausea And Vomiting   VACCINATION STATUS:  There is no immunization history on file for this patient.  Diabetes He presents for his follow-up diabetic visit. He has type 2 diabetes mellitus. His disease course has been stable. There are no hypoglycemic associated symptoms. Pertinent negatives for hypoglycemia include no confusion, headaches, pallor or seizures. Pertinent negatives for diabetes include no chest pain, no fatigue, no polydipsia, no polyphagia, no polyuria and no weakness. There are no hypoglycemic complications. Symptoms are stable. Diabetic complications include peripheral neuropathy. Risk factors for coronary artery disease include diabetes mellitus, dyslipidemia, hypertension, male sex, obesity and sedentary lifestyle. He is compliant with treatment most of the time. His weight is decreasing steadily. He is following a generally unhealthy diet. When asked about meal planning, he reported none. He has had a previous visit with a dietitian. He never participates in exercise. His home blood glucose trend is fluctuating minimally. His breakfast blood glucose range is generally 90-110 mg/dl. His bedtime blood glucose range is generally 110-130 mg/dl. His overall blood glucose range is 110-130 mg/dl. An ACE inhibitor/angiotensin II receptor blocker is being taken. Eye exam is current.  Hyperlipidemia This is a chronic problem. The current episode started more than 1 year ago. The problem is uncontrolled. Exacerbating diseases include diabetes and obesity. Pertinent negatives include no chest pain, myalgias or shortness of breath. Current antihyperlipidemic treatment includes statins. Compliance problems include adherence to diet.  Risk factors for coronary artery disease include diabetes mellitus, dyslipidemia, hypertension, male sex, obesity and a sedentary lifestyle.  Hypertension This is a chronic problem. The current episode started  more than 1 year ago. Pertinent negatives include no chest pain, headaches, neck pain, palpitations or shortness of breath. Risk factors for coronary artery disease include dyslipidemia, diabetes mellitus, obesity, male gender and sedentary lifestyle. Past treatments include ACE inhibitors.    Review of systems: Limited as above.    Objective:    There were no vitals taken for this visit.  Wt Readings from Last 3 Encounters:  03/08/19 238 lb (108 kg)  10/20/18 258 lb (117 kg)  06/16/18 263 lb (119.3 kg)     Recent Results (from the past 2160 hour(s))  Basic metabolic panel     Status: None   Collection Time: 12/20/19 12:00 AM  Result Value Ref Range   BUN 20 4 - 21   Creatinine 1.2 0.6 - 1.3  Lipid panel     Status: None   Collection Time: 12/20/19 12:00  AM  Result Value Ref Range   Triglycerides 120 40 - 160   Cholesterol 163 0 - 200   HDL 50 35 - 70   LDL Cholesterol 92   Hemoglobin A1c     Status: None   Collection Time: 12/20/19 12:00 AM  Result Value Ref Range   Hemoglobin A1C 5.9    Lipid Panel     Component Value Date/Time   CHOL 163 12/20/2019 0000   TRIG 120 12/20/2019 0000   HDL 50 12/20/2019 0000   CHOLHDL 4.1 10/06/2018 1310   VLDL 29 04/15/2016 0836   LDLCALC 92 12/20/2019 0000   LDLCALC 114 (H) 10/06/2018 1310    Assessment & Plan:   1. Diabetes mellitus without complication (HCC)  - His diabetes is  complicated by peripheral neuropathy , peripheral arterial disease, Charcot's deformities and patient remains at a high risk for more acute and chronic complications of diabetes which include CAD, CVA, CKD, retinopathy, and neuropathy. These are all discussed in detail with the patient.  -He has achieved remarkable control on his diabetes with A1c of 5.9%, improving from recent A1c of 7.5%.  He reports his glycemic profile ranged from 82-99 mg per DL over the last 2 weeks.     - Recent labs reviewed with him, showing normal renal function.  - I have  re-counseled the patient on diet management and  and weight loss by adopting a carbohydrate restricted / protein rich  Diet.  -He has gained weight, admits to dietary indiscretion.  - he he lost approximately 30 pounds over the last 6 months.  Admittedly by changing his diet and lifestyle.    -  Suggestion is made for him to avoid simple carbohydrates  from his diet including Cakes, Sweet Desserts / Pastries, Ice Cream, Soda (diet and regular), Sweet Tea, Candies, Chips, Cookies, Sweet Pastries,  Store Bought Juices, Alcohol in Excess of  1-2 drinks a day, Artificial Sweeteners, Coffee Creamer, and "Sugar-free" Products. This will help patient to have stable blood glucose profile and potentially avoid unintended weight gain.   - Patient is advised to stick to a routine mealtimes to eat 3 meals  a day and avoid unnecessary snacks ( to snack only to correct hypoglycemia).  - I have approached patient with the following individualized plan to manage diabetes and patient agrees.  -Given his diet control of glycemic profile and A1c of 5.9%, he is advised to hold his Evaristo Bury for now. -he will continue to benefit from Victoza and Metformin.  -He is advised to continue Victoza 1.8 mg subcutaneously daily.   -He will continue to benefit from metformin therapy, advised to continue  metformin 500 mg ER p.o. twice daily-after breakfast and supper.    - Patient specific target  for A1c; LDL, HDL, Triglycerides, and  Waist Circumference were discussed in detail.  2) BP/HTN:  he is advised to home monitor blood pressure and report if > 140/90 on 2 separate readings.    He is advised to continue his current blood pressure medications including lisinopril-hydrochlorthiazide 20-25 mg p.o. daily.    3) Lipids/HPL: His recent lipid panel revealed uncontrolled LDL at 114.  He is advised to continue pravastatin 40 mg p.o. nightly.  Side effects and precautions discussed with him.   4)  Weight/Diet:  CDE  consult in progress, exercise, and carbohydrates information provided.  5) Chronic Care/Health Maintenance:  -Patient is on ACEI/ARB and Statin medications and encouraged to continue to follow up with Ophthalmology, Podiatrist  at least yearly or according to recommendations, and advised to  stay away from smoking. I have recommended yearly flu vaccine and pneumonia vaccination at least every 5 years; moderate intensity exercise for up to 150 minutes weekly; and  sleep for at least 7 hours a day.  - I advised patient to maintain close follow up with Louie Boston., MD for primary care needs.      - Time spent on this patient care encounter:  35 min, of which >50% was spent in  counseling and the rest reviewing his  current and  previous labs/studies ( including abstraction from other facilities),  previous treatments, his blood glucose readings, and medications' doses and developing a plan for long-term care based on the latest recommendations for standards of care; and documenting his care.  [facility] participated in the discussions, expressed understanding, and voiced agreement with the above plans.  All questions were answered to his satisfaction. he is encouraged to contact clinic should he have any questions or concerns prior to his return visit.   Follow up plan: -Return in about 4 months (around 05/02/2020) for Bring Meter and Logs- A1c in Office.  Marquis Lunch, MD Phone: [phone]  Fax: [phone]  -  This note was partially dictated with voice recognition software. Similar sounding words can be transcribed inadequately or may not  be corrected upon review.  01/03/2020, 6:01 PM

## 2020-01-16 ENCOUNTER — Other Ambulatory Visit: Payer: Self-pay | Admitting: "Endocrinology

## 2020-01-17 ENCOUNTER — Ambulatory Visit: Payer: BC Managed Care – PPO | Admitting: "Endocrinology

## 2020-01-17 ENCOUNTER — Other Ambulatory Visit: Payer: Self-pay | Admitting: "Endocrinology

## 2020-01-18 NOTE — Progress Notes (Signed)
This encounter was created in error - please disregard.

## 2020-01-31 ENCOUNTER — Other Ambulatory Visit: Payer: Self-pay | Admitting: "Endocrinology

## 2020-02-14 ENCOUNTER — Ambulatory Visit: Payer: Medicare HMO | Admitting: Podiatry

## 2020-02-14 ENCOUNTER — Other Ambulatory Visit: Payer: Self-pay

## 2020-02-14 ENCOUNTER — Ambulatory Visit (INDEPENDENT_AMBULATORY_CARE_PROVIDER_SITE_OTHER): Payer: Medicare HMO

## 2020-02-14 ENCOUNTER — Encounter: Payer: Self-pay | Admitting: Podiatry

## 2020-02-14 VITALS — Temp 97.6°F

## 2020-02-14 DIAGNOSIS — M79671 Pain in right foot: Secondary | ICD-10-CM

## 2020-02-14 DIAGNOSIS — M79675 Pain in left toe(s): Secondary | ICD-10-CM

## 2020-02-14 DIAGNOSIS — B351 Tinea unguium: Secondary | ICD-10-CM

## 2020-02-14 DIAGNOSIS — L84 Corns and callosities: Secondary | ICD-10-CM

## 2020-02-14 DIAGNOSIS — M2042 Other hammer toe(s) (acquired), left foot: Secondary | ICD-10-CM | POA: Diagnosis not present

## 2020-02-14 DIAGNOSIS — M79674 Pain in right toe(s): Secondary | ICD-10-CM | POA: Diagnosis not present

## 2020-02-14 DIAGNOSIS — M79672 Pain in left foot: Secondary | ICD-10-CM

## 2020-02-14 MED ORDER — CEPHALEXIN 500 MG PO CAPS: 500.0000 mg | ORAL_CAPSULE | Freq: Three times a day (TID) | ORAL | 1 refills | Status: DC

## 2020-02-15 ENCOUNTER — Other Ambulatory Visit: Payer: Self-pay | Admitting: Podiatry

## 2020-02-15 DIAGNOSIS — M2042 Other hammer toe(s) (acquired), left foot: Secondary | ICD-10-CM

## 2020-02-16 NOTE — Progress Notes (Signed)
Subjective:   Patient ID: Calvin Shields, male   DOB: 64 y.o.   MRN: [id number]   HPI Patient presents stating he has had chronic problems between the fourth and fifth digits of his left foot stating that it is been sore and making it hard for him to wear shoe gear.  He has been on an antibiotic doxycycline for about 8 weeks and it does not seem to be making a difference and it is a burning pain and itches at times.  Patient does work and knows he will probably need to get this fixed but wants to try other methods and patient also has nails that he cannot Take care of they get thick and painful   Review of Systems  All other systems reviewed and are negative.       Objective:  Physical Exam Vitals and nursing note reviewed.  Constitutional:      Appearance: He is well-developed.  Pulmonary:     Effort: Pulmonary effort is normal.  Musculoskeletal:        General: Normal range of motion.  Skin:    General: Skin is warm.  Neurological:     Mental Status: He is alert.     Neurovascular status intact muscle strength is found to be adequate range of motion within normal limits with sugar that is under reasonably good control.  Patient has chronic interspace lesion fourth interspace left with localized breakdown of tissue rotated fifth digit pain in between with keratotic lesion formation.  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  Placed on cephalexin  500 mg 3 times daily we will see the results of this and decide in the next several weeks what is necessary.  I went ahead debrided nailbeds 1-5 both feet with no iatrogenic bleeding and reappoint  X-rays indicate that there is no indications of osteolysis or bone pathology associated with this except for an enlarged head of proximal phalanx digit 5 left

## 2020-02-17 ENCOUNTER — Other Ambulatory Visit: Payer: Self-pay | Admitting: "Endocrinology

## 2020-02-28 ENCOUNTER — Encounter: Payer: Self-pay | Admitting: Podiatry

## 2020-02-28 ENCOUNTER — Ambulatory Visit: Payer: Medicare HMO | Admitting: Podiatry

## 2020-02-28 ENCOUNTER — Other Ambulatory Visit: Payer: Self-pay

## 2020-02-28 VITALS — Temp 97.4°F

## 2020-02-28 DIAGNOSIS — L84 Corns and callosities: Secondary | ICD-10-CM

## 2020-02-28 DIAGNOSIS — L03119 Cellulitis of unspecified part of limb: Secondary | ICD-10-CM

## 2020-02-28 DIAGNOSIS — M2042 Other hammer toe(s) (acquired), left foot: Secondary | ICD-10-CM

## 2020-02-28 DIAGNOSIS — L02619 Cutaneous abscess of unspecified foot: Secondary | ICD-10-CM

## 2020-02-28 MED ORDER — CEPHALEXIN 500 MG PO CAPS: 500.0000 mg | ORAL_CAPSULE | Freq: Three times a day (TID) | ORAL | 1 refills | Status: DC

## 2020-04-03 ENCOUNTER — Encounter: Payer: Self-pay | Admitting: "Endocrinology

## 2020-04-03 DIAGNOSIS — E782 Mixed hyperlipidemia: Secondary | ICD-10-CM | POA: Diagnosis not present

## 2020-04-03 DIAGNOSIS — M109 Gout, unspecified: Secondary | ICD-10-CM | POA: Diagnosis not present

## 2020-04-03 DIAGNOSIS — R5382 Chronic fatigue, unspecified: Secondary | ICD-10-CM | POA: Diagnosis not present

## 2020-04-03 DIAGNOSIS — I10 Essential (primary) hypertension: Secondary | ICD-10-CM | POA: Diagnosis not present

## 2020-04-03 DIAGNOSIS — E1142 Type 2 diabetes mellitus with diabetic polyneuropathy: Secondary | ICD-10-CM | POA: Diagnosis not present

## 2020-04-10 DIAGNOSIS — M1712 Unilateral primary osteoarthritis, left knee: Secondary | ICD-10-CM | POA: Diagnosis not present

## 2020-04-10 DIAGNOSIS — I10 Essential (primary) hypertension: Secondary | ICD-10-CM | POA: Diagnosis not present

## 2020-04-10 DIAGNOSIS — Z6835 Body mass index (BMI) 35.0-35.9, adult: Secondary | ICD-10-CM | POA: Diagnosis not present

## 2020-04-10 DIAGNOSIS — R1011 Right upper quadrant pain: Secondary | ICD-10-CM | POA: Diagnosis not present

## 2020-04-10 DIAGNOSIS — N400 Enlarged prostate without lower urinary tract symptoms: Secondary | ICD-10-CM | POA: Diagnosis not present

## 2020-04-10 DIAGNOSIS — E782 Mixed hyperlipidemia: Secondary | ICD-10-CM | POA: Diagnosis not present

## 2020-04-10 DIAGNOSIS — R4582 Worries: Secondary | ICD-10-CM | POA: Diagnosis not present

## 2020-04-10 DIAGNOSIS — E1142 Type 2 diabetes mellitus with diabetic polyneuropathy: Secondary | ICD-10-CM | POA: Diagnosis not present

## 2020-04-13 DIAGNOSIS — R5382 Chronic fatigue, unspecified: Secondary | ICD-10-CM | POA: Diagnosis not present

## 2020-04-13 DIAGNOSIS — I10 Essential (primary) hypertension: Secondary | ICD-10-CM | POA: Diagnosis not present

## 2020-04-13 DIAGNOSIS — R1011 Right upper quadrant pain: Secondary | ICD-10-CM | POA: Diagnosis not present

## 2020-04-13 LAB — LIPID PANEL
Cholesterol: 159 (ref 0–200)
HDL: 51 (ref 35–70)
LDL Cholesterol: 90
Triglycerides: 100 (ref 40–160)

## 2020-04-13 LAB — BASIC METABOLIC PANEL
BUN: 18 (ref 4–21)
Creatinine: 1.4 — AB (ref 0.6–1.3)

## 2020-04-13 LAB — TSH: TSH: 1.14 (ref 0.41–5.90)

## 2020-04-13 LAB — HEMOGLOBIN A1C: Hemoglobin A1C: 6.2

## 2020-04-13 LAB — MICROALBUMIN, URINE: Microalb, Ur: <3

## 2020-04-17 DIAGNOSIS — D7389 Other diseases of spleen: Secondary | ICD-10-CM | POA: Diagnosis not present

## 2020-04-17 DIAGNOSIS — R1011 Right upper quadrant pain: Secondary | ICD-10-CM | POA: Diagnosis not present

## 2020-04-25 DIAGNOSIS — M170 Bilateral primary osteoarthritis of knee: Secondary | ICD-10-CM | POA: Diagnosis not present

## 2020-04-27 ENCOUNTER — Telehealth: Payer: Self-pay | Admitting: *Deleted

## 2020-04-27 NOTE — Telephone Encounter (Signed)
Received refill request from [facility]:  Cephalexin 500 mg Take 1 capsule 3 times a day  Dr. Charlsie Merles approved refill plus 1 additional refill  Faxed over to [facility] on 04/27/20

## 2020-05-01 ENCOUNTER — Encounter: Payer: Self-pay | Admitting: "Endocrinology

## 2020-05-01 ENCOUNTER — Ambulatory Visit (INDEPENDENT_AMBULATORY_CARE_PROVIDER_SITE_OTHER): Payer: Medicare HMO | Admitting: "Endocrinology

## 2020-05-01 ENCOUNTER — Other Ambulatory Visit: Payer: Self-pay

## 2020-05-01 VITALS — BP 111/71 | HR 78 | Ht 69.5 in | Wt 229.6 lb

## 2020-05-01 DIAGNOSIS — E782 Mixed hyperlipidemia: Secondary | ICD-10-CM | POA: Diagnosis not present

## 2020-05-01 DIAGNOSIS — I10 Essential (primary) hypertension: Secondary | ICD-10-CM | POA: Diagnosis not present

## 2020-05-01 DIAGNOSIS — E1165 Type 2 diabetes mellitus with hyperglycemia: Secondary | ICD-10-CM

## 2020-05-01 MED ORDER — METFORMIN HCL 500 MG PO TABS: 500.0000 mg | ORAL_TABLET | Freq: Every day | ORAL | 1 refills | Status: DC

## 2020-05-01 NOTE — Patient Instructions (Signed)

## 2020-05-01 NOTE — Progress Notes (Signed)
05/01/2020   Endocrinology follow-up note   Subjective:    Patient ID: Calvin Shields, male    DOB: [birth year], PCP Louie Boston., MD   Past Medical History:  Diagnosis Date  . Diabetes mellitus without complication (HCC)   . Hyperlipidemia   . Hypertension    Past Surgical History:  Procedure Laterality Date  . AMPUTATION Left 09/08/2017   Procedure: PARTIAL TOE AMPUTATION LEFT HALLUX;  Surgeon: Erskine Emery, DPM;  Location: [facility];  Service: Podiatry;  Laterality: Left;  . right leg surgery       History reviewed. No pertinent family history.  Social History   Socioeconomic History  . Marital status: Married    Spouse name: Not on file  . Number of children: Not on file  . Years of education: Not on file  . Highest education level: Not on file  Occupational History  . Not on file  Tobacco Use  . Smoking status: Never Smoker  . Smokeless tobacco: Never Used  Substance and Sexual Activity  . Alcohol use: No  . Drug use: No  . Sexual activity: Not on file  Other Topics Concern  . Not on file  Social History Narrative  . Not on file   Social Determinants of Health   Financial Resource Strain:   . Difficulty of Paying Living Expenses:   Food Insecurity:   . Worried About Programme researcher, broadcasting/film/video in the Last Year:   . Barista in the Last Year:   Transportation Needs:   . Freight forwarder (Medical):   Marland Kitchen Lack of Transportation (Non-Medical):   Physical Activity:   . Days of Exercise per Week:   . Minutes of Exercise per Session:   Stress:   . Feeling of Stress :   Social Connections:   . Frequency of Communication with Friends and Family:   . Frequency of Social Gatherings with Friends and Family:   . Attends Religious Services:   . Active Member of Clubs or Organizations:   . Attends Banker Meetings:   Marland Kitchen Marital Status:    Outpatient Encounter Medications as of 05/01/2020  Medication Sig  . ACCU-CHEK FASTCLIX LANCETS  MISC Use to test blood glucose 2 times a day.  . allopurinol (ZYLOPRIM) 100 MG tablet Take 100 mg by mouth daily.  Marland Kitchen aspirin EC 81 MG tablet Take 81 mg by mouth daily.  . cephALEXin (KEFLEX) 500 MG capsule Take 1 capsule (500 mg total) by mouth 3 (three) times daily.  . cephALEXin (KEFLEX) 500 MG capsule Take 1 capsule (500 mg total) by mouth 3 (three) times daily.  . CONTOUR NEXT TEST test strip USE TO TEST BLOOD SUGAR TWICE DAILY AS DIRECTED.  Marland Kitchen GLOBAL EASE INJECT PEN NEEDLES 31G X 8 MM MISC USE AS DIRECTED.  Marland Kitchen glucose blood (ACCU-CHEK GUIDE) test strip Use as instructed bid  . ibuprofen (ADVIL,MOTRIN) 800 MG tablet Take 800 mg by mouth every 8 (eight) hours as needed.  Marland Kitchen lisinopril-hydrochlorothiazide (PRINZIDE,ZESTORETIC) 20-25 MG per tablet Take 1 tablet by mouth daily.  . metFORMIN (GLUCOPHAGE) 500 MG tablet Take 1 tablet (500 mg total) by mouth daily with breakfast.  . Multiple Vitamin (MULTIVITAMIN WITH MINERALS) TABS tablet Take 1 tablet by mouth daily.  . pravastatin (PRAVACHOL) 40 MG tablet TAKE 1 TABLET BY MOUTH ONCE DAILY.  Marland Kitchen VICTOZA 18 MG/3ML SOPN INJECT 1.8 MG SUBQ EVERY DAY.  . [DISCONTINUED] metFORMIN (GLUCOPHAGE) 500 MG tablet TAKE 1 TABLET BY MOUTH  2 TIMES A DAY WITH MEALS.   No facility-administered encounter medications on file as of 05/01/2020.   ALLERGIES: Allergies  Allergen Reactions  . Codeine Nausea And Vomiting  . Aspirin Other (See Comments)    Other reaction(s): Abdominal Pain, Other (See Comments)   . Ibuprofen Other (See Comments)    Other reaction(s): Abdominal Pain, Other (See Comments)    VACCINATION STATUS: Immunization History  Administered Date(s) Administered  . Influenza,inj,Quad PF,6+ Mos 10/13/2017, 10/06/2019    Diabetes He presents for his follow-up diabetic visit. He has type 2 diabetes mellitus. His disease course has been stable. There are no hypoglycemic associated symptoms. Pertinent negatives for hypoglycemia include no confusion,  headaches, pallor or seizures. Pertinent negatives for diabetes include no chest pain, no fatigue, no polydipsia, no polyphagia, no polyuria and no weakness. There are no hypoglycemic complications. Symptoms are stable. Diabetic complications include peripheral neuropathy. Risk factors for coronary artery disease include diabetes mellitus, dyslipidemia, hypertension, male sex, obesity and sedentary lifestyle. He is compliant with treatment most of the time. His weight is decreasing steadily. He is following a generally unhealthy diet. When asked about meal planning, he reported none. He has had a previous visit with a dietitian. He never participates in exercise. His home blood glucose trend is fluctuating minimally. (Presents with near target glycemic profile and A1c of 6.2%.) An ACE inhibitor/angiotensin II receptor blocker is being taken. Eye exam is current.  Hyperlipidemia This is a chronic problem. The current episode started more than 1 year ago. The problem is uncontrolled. Exacerbating diseases include diabetes and obesity. Pertinent negatives include no chest pain, myalgias or shortness of breath. Current antihyperlipidemic treatment includes statins. Compliance problems include adherence to diet.  Risk factors for coronary artery disease include diabetes mellitus, dyslipidemia, hypertension, male sex, obesity and a sedentary lifestyle.  Hypertension This is a chronic problem. The current episode started more than 1 year ago. Pertinent negatives include no chest pain, headaches, neck pain, palpitations or shortness of breath. Risk factors for coronary artery disease include dyslipidemia, diabetes mellitus, obesity, male gender and sedentary lifestyle. Past treatments include ACE inhibitors.     Review of systems  Constitutional: + Minimally fluctuating body weight,  current  Body mass index is 33.42 kg/m. , no fatigue, no subjective hyperthermia, no subjective hypothermia Eyes: no blurry  vision, no xerophthalmia ENT: no sore throat, no nodules palpated in throat, no dysphagia/odynophagia, no hoarseness Cardiovascular: no Chest Pain, no Shortness of Breath, no palpitations, no leg swelling Respiratory: no cough, no shortness of breath Gastrointestinal: no Nausea/Vomiting/Diarhhea Musculoskeletal: no muscle/joint aches Skin: no rashes, no hyperemia Neurological: no tremors, no numbness, no tingling, no dizziness Psychiatric: no depression, no anxiety   Objective:    BP 111/71   Pulse 78   Ht 5' 9.5" (1.765 m)   Wt 229 lb 9.6 oz (104.1 kg)   BMI 33.42 kg/m   Wt Readings from Last 3 Encounters:  05/01/20 229 lb 9.6 oz (104.1 kg)  03/08/19 238 lb (108 kg)  10/20/18 258 lb (117 kg)     Physical Exam- Limited  Constitutional:  Body mass index is 33.42 kg/m. , not in acute distress, normal state of mind Eyes:  EOMI, no exophthalmos Neck: Supple Thyroid: No gross goiter Respiratory: Adequate breathing efforts Musculoskeletal: no gross deformities, strength intact in all four extremities, no gross restriction of joint movements Skin:  no rashes, no hyperemia Neurological: no tremor with outstretched hands,     Recent Results (from the past 2160  hour(s))  Microalbumin, urine     Status: None   Collection Time: 04/13/20 12:00 AM  Result Value Ref Range   Microalb, Ur <3   Basic metabolic panel     Status: Abnormal   Collection Time: 04/13/20 12:00 AM  Result Value Ref Range   BUN 18 4 - 21   Creatinine 1.4 (A) 0.6 - 1.3  Lipid panel     Status: None   Collection Time: 04/13/20 12:00 AM  Result Value Ref Range   Triglycerides 100 40 - 160   Cholesterol 159 0 - 200   HDL 51 35 - 70   LDL Cholesterol 90   Hemoglobin A1c     Status: None   Collection Time: 04/13/20 12:00 AM  Result Value Ref Range   Hemoglobin A1C 6.2   TSH     Status: None   Collection Time: 04/13/20 12:00 AM  Result Value Ref Range   TSH 1.14 0.41 - 5.90   Lipid Panel      Component Value Date/Time   CHOL 159 04/13/2020 0000   TRIG 100 04/13/2020 0000   HDL 51 04/13/2020 0000   CHOLHDL 4.1 10/06/2018 1310   VLDL 29 04/15/2016 0836   LDLCALC 90 04/13/2020 0000   LDLCALC 114 (H) 10/06/2018 1310    Assessment & Plan:   1. Diabetes mellitus without complication (HCC)  - His diabetes is  complicated by peripheral neuropathy , peripheral arterial disease, Charcot's deformities and patient remains at a high risk for more acute and chronic complications of diabetes which include CAD, CVA, CKD, retinopathy, and neuropathy. These are all discussed in detail with the patient.  -He has achieved remarkable control on his diabetes with A1c of 6.2%, improving from recent A1c of 7.5%.  He denies any hypoglycemia.    - Recent labs reviewed with him, showing normal renal function.  - I have re-counseled the patient on diet management and  and weight loss by adopting a carbohydrate restricted / protein rich  Diet.  -He has gained weight, admits to dietary indiscretion.  - he he lost approximately 30 pounds over the last 6 months.  Admittedly by changing his diet and lifestyle.    -  Suggestion is made for him to avoid simple carbohydrates  from his diet including Cakes, Sweet Desserts / Pastries, Ice Cream, Soda (diet and regular), Sweet Tea, Candies, Chips, Cookies, Sweet Pastries,  Store Bought Juices, Alcohol in Excess of  1-2 drinks a day, Artificial Sweeteners, Coffee Creamer, and "Sugar-free" Products. This will help patient to have stable blood glucose profile and potentially avoid unintended weight gain.  - Patient is advised to stick to a routine mealtimes to eat 3 meals  a day and avoid unnecessary snacks ( to snack only to correct hypoglycemia).  - I have approached patient with the following individualized plan to manage diabetes and patient agrees.  -Given his diet control of glycemic profile and A1c of 6.2%, he is advised to stay off of the insulin  treatment for now.  He will continue to benefit from Victoza and Metformin.  He is advised to continue Victoza 1.8 mg of PSA daily, lower his Metformin to 500 mg ER p.o. daily after breakfast.    - Patient specific target  for A1c; LDL, HDL, Triglycerides, and  Waist Circumference were discussed in detail.  2) BP/HTN:   His blood pressure is controlled to target.   He is advised to continue his current blood pressure medications including lisinopril-hydrochlorthiazide  20-25 mg p.o. daily.    3) Lipids/HPL: His recent lipid panel revealed uncontrolled LDL at 114.  He is advised to continue pravastatin 40 mg p.o. nightly.   Side effects and precautions discussed with him.   4)  Weight/Diet:  CDE consult in progress, exercise, and carbohydrates information provided.  5) Chronic Care/Health Maintenance:  -Patient is on ACEI/ARB and Statin medications and encouraged to continue to follow up with Ophthalmology, Podiatrist at least yearly or according to recommendations, and advised to  stay away from smoking. I have recommended yearly flu vaccine and pneumonia vaccination at least every 5 years; moderate intensity exercise for up to 150 minutes weekly; and  sleep for at least 7 hours a day.  - I advised patient to maintain close follow up with Deloria Lair., MD for primary care needs.      - Time spent on this patient care encounter:  35 min, of which > 50% was spent in  counseling and the rest reviewing his blood glucose logs , discussing his hypoglycemia and hyperglycemia episodes, reviewing his current and  previous labs / studies  ( including abstraction from other facilities) and medications  doses and developing a  long term treatment plan and documenting his care.   Please refer to Patient Instructions for Blood Glucose Monitoring and Insulin/Medications Dosing Guide"  in media tab for additional information. Please  also refer to " Patient Self Inventory" in the Media  tab for reviewed  elements of pertinent patient history.  [facility] participated in the discussions, expressed understanding, and voiced agreement with the above plans.  All questions were answered to his satisfaction. he is encouraged to contact clinic should he have any questions or concerns prior to his return visit.    Follow up plan: -Return in about 4 months (around 09/01/2020) for Follow up with Pre-visit Labs, Bring Meter and Logs- A1c in Office.  Glade Lloyd, MD Phone: [phone]  Fax: [phone]  -  This note was partially dictated with voice recognition software. Similar sounding words can be transcribed inadequately or may not  be corrected upon review.  05/01/2020, 6:02 PM

## 2020-05-15 DIAGNOSIS — E669 Obesity, unspecified: Secondary | ICD-10-CM | POA: Diagnosis not present

## 2020-05-15 DIAGNOSIS — E1142 Type 2 diabetes mellitus with diabetic polyneuropathy: Secondary | ICD-10-CM | POA: Diagnosis not present

## 2020-05-15 DIAGNOSIS — Z6834 Body mass index (BMI) 34.0-34.9, adult: Secondary | ICD-10-CM | POA: Diagnosis not present

## 2020-05-15 DIAGNOSIS — I10 Essential (primary) hypertension: Secondary | ICD-10-CM | POA: Diagnosis not present

## 2020-05-15 DIAGNOSIS — M109 Gout, unspecified: Secondary | ICD-10-CM | POA: Diagnosis not present

## 2020-05-15 DIAGNOSIS — Z7984 Long term (current) use of oral hypoglycemic drugs: Secondary | ICD-10-CM | POA: Diagnosis not present

## 2020-05-15 DIAGNOSIS — E785 Hyperlipidemia, unspecified: Secondary | ICD-10-CM | POA: Diagnosis not present

## 2020-05-15 DIAGNOSIS — B351 Tinea unguium: Secondary | ICD-10-CM | POA: Diagnosis not present

## 2020-05-15 DIAGNOSIS — N400 Enlarged prostate without lower urinary tract symptoms: Secondary | ICD-10-CM | POA: Diagnosis not present

## 2020-06-05 ENCOUNTER — Ambulatory Visit: Payer: Medicare HMO | Admitting: Podiatry

## 2020-06-05 ENCOUNTER — Encounter: Payer: Self-pay | Admitting: Podiatry

## 2020-06-05 ENCOUNTER — Other Ambulatory Visit: Payer: Self-pay

## 2020-06-05 DIAGNOSIS — E119 Type 2 diabetes mellitus without complications: Secondary | ICD-10-CM

## 2020-06-05 DIAGNOSIS — E1142 Type 2 diabetes mellitus with diabetic polyneuropathy: Secondary | ICD-10-CM

## 2020-06-05 DIAGNOSIS — B351 Tinea unguium: Secondary | ICD-10-CM | POA: Diagnosis not present

## 2020-06-05 DIAGNOSIS — Z89412 Acquired absence of left great toe: Secondary | ICD-10-CM

## 2020-06-05 DIAGNOSIS — L84 Corns and callosities: Secondary | ICD-10-CM

## 2020-06-05 NOTE — Patient Instructions (Addendum)
Diabetic Neuropathy Diabetic neuropathy refers to nerve damage that is caused by diabetes (diabetes mellitus). Over time, people with diabetes can develop nerve damage throughout the body. There are several types of diabetic neuropathy:  Peripheral neuropathy. This is the most common type of diabetic neuropathy. It causes damage to nerves that carry signals between the spinal cord and other parts of the body (peripheral nerves). This usually affects nerves in the feet and legs first, and may eventually affect the hands and arms. The damage affects the ability to sense touch or temperature.  Autonomic neuropathy. This type causes damage to nerves that control involuntary functions (autonomic nerves). These nerves carry signals that control: ? Heartbeat. ? Body temperature. ? Blood pressure. ? Urination. ? Digestion. ? Sweating. ? Sexual function. ? Response to changing blood sugar (glucose) levels.  Focal neuropathy. This type of nerve damage affects one area of the body, such as an arm, a leg, or the face. The injury may involve one nerve or a small group of nerves. Focal neuropathy can be painful and unpredictable, and occurs most often in older adults with diabetes. This often develops suddenly, but usually improves over time and does not cause long-term problems.  Proximal neuropathy. This type of nerve damage affects the nerves of the thighs, hips, buttocks, or legs. It causes severe pain, weakness, and muscle death (atrophy), usually in the thigh muscles. It is more common among older men and people who have type 2 diabetes. The length of recovery time may vary. What are the causes? Peripheral, autonomic, and focal neuropathies are caused by diabetes that is not well controlled with treatment. The cause of proximal neuropathy is not known, but it may be caused by inflammation related to uncontrolled blood glucose levels. What are the signs or symptoms? Peripheral neuropathy Peripheral  neuropathy develops slowly over time. When the nerves of the feet and legs no longer work, you may experience:  Burning, stabbing, or aching pain in the legs or feet.  Pain or cramping in the legs or feet.  Loss of feeling (numbness) and inability to feel pressure or pain in the feet. This can lead to: ? Thick calluses or sores on areas of constant pressure. ? Ulcers. ? Reduced ability to feel temperature changes.  Foot deformities.  Muscle weakness.  Loss of balance or coordination. Autonomic neuropathy The symptoms of autonomic neuropathy vary depending on which nerves are affected. Symptoms may include:  Problems with digestion, such as: ? Nausea or vomiting. ? Poor appetite. ? Bloating. ? Diarrhea or constipation. ? Trouble swallowing. ? Losing weight without trying to.  Problems with the heart, blood and lungs, such as: ? Dizziness, especially when standing up. ? Fainting. ? Shortness of breath. ? Irregular heartbeat.  Bladder problems, such as: ? Trouble starting or stopping urination. ? Leaking urine. ? Trouble emptying the bladder. ? Urinary tract infections (UTIs).  Problems with other body functions, such as: ? Sweat. You may sweat too much or too little. ? Temperature. You might get hot easily. Or, you might feel cold more than usual. ? Sexual function. Men may not be able to get or maintain an erection. Women may have vaginal dryness and difficulty with arousal. Focal neuropathy Symptoms affect only one area of the body. Common symptoms include:  Numbness.  Tingling.  Burning pain.  Prickling feeling.  Very sensitive skin.  Weakness.  Inability to move (paralysis).  Muscle twitching.  Muscles getting smaller (wasting).  Poor coordination.  Double or blurred vision. Proximal   neuropathy  Sudden, severe pain in the hip, thigh, or buttocks. Pain may spread from the back into the legs (sciatica).  Pain and numbness in the arms and  legs.  Tingling.  Loss of bladder control or bowel control.  Weakness and wasting of thigh muscles.  Difficulty getting up from a seated position.  Abdominal swelling.  Unexplained weight loss. How is this diagnosed? Diagnosis usually involves reviewing your medical history and any symptoms you have. Diagnosis varies depending on the type of neuropathy your health care provider suspects. Peripheral neuropathy Your health care provider will check areas that are affected by your nervous system (neurologic exam), such as your reflexes, how you move, and what you can feel. You may have other tests, such as:  Blood tests.  Removal and examination of fluid that surrounds the spinal cord (lumbar puncture).  CT scan.  MRI.  A test to check the nerves that control muscles (electromyogram, EMG).  Tests of how quickly messages pass through your nerves (nerve conduction velocity tests).  Removal of a small piece of nerve to be examined under a microscope (biopsy). Autonomic neuropathy You may have tests, such as:  Tests to measure your blood pressure and heart rate. This may include monitoring you while you are safely secured to an exam table that moves you from a lying position to an upright position (table tilt test).  Breathing tests to check your lungs.  Tests to check how food moves through the digestive system (gastric emptying tests).  Blood, sweat, or urine tests.  Ultrasound of your bladder.  Spinal fluid tests. Focal neuropathy This condition may be diagnosed with:  A neurologic exam.  CT scan.  MRI.  EMG.  Nerve conduction velocity tests. Proximal neuropathy There is no test to diagnose this type of neuropathy. You may have tests to rule out other possible causes of this type of neuropathy. Tests may include:  X-rays of your spine and lumbar region.  Lumbar puncture.  MRI. How is this treated? The goal of treatment is to keep nerve damage from getting  worse. The most important part of treatment is keeping your blood glucose level and your A1C level within your target range by following your diabetes management plan. Over time, maintaining lower blood glucose levels helps lessen symptoms. In some cases, you may need prescription pain medicine. Follow these instructions at home:  Lifestyle   Do not use any products that contain nicotine or tobacco, such as cigarettes and e-cigarettes. If you need help quitting, ask your health care provider.  Be physically active every day. Include strength training and balance exercises.  Follow a healthy meal plan.  Work with your health care provider to manage your blood pressure. General instructions  Follow your diabetes management plan as directed. ? Check your blood glucose levels as directed by your health care provider. ? Keep your blood glucose in your target range as directed by your health care provider. ? Have your A1C level checked at least two times a year, or as often as told by your health care provider.  Take over the counter and prescription medicines only as told by your health care provider. This includes insulin and diabetes medicine.  Do not drive or use heavy machinery while taking prescription pain medicines.  Check your skin and feet every day for cuts, bruises, redness, blisters, or sores.  Keep all follow up visits as told by your health care provider. This is important. Contact a health care provider if:  You have burning, stabbing, or aching pain in your legs or feet.  You are unable to feel pressure or pain in your feet.  You develop problems with digestion, such as: ? Nausea. ? Vomiting. ? Bloating. ? Constipation. ? Diarrhea. ? Abdominal pain.  You have difficulty with urination, such as inability: ? To control when you urinate (incontinence). ? To completely empty the bladder (retention).  You have palpitations.  You feel dizzy, weak, or faint when you  stand up. Get help right away if:  You cannot urinate.  You have sudden weakness or loss of coordination.  You have trouble speaking.  You have pain or pressure in your chest.  You have an irregular heart beat.  You have sudden inability to move a part of your body. Summary  Diabetic neuropathy refers to nerve damage that is caused by diabetes. It can affect nerves throughout the entire body, causing numbness and pain in the arms, legs, digestive tract, heart, and other body systems.  Keep your blood glucose level and your blood pressure in your target range, as directed by your health care provider. This can help prevent neuropathy from getting worse.  Check your skin and feet every day for cuts, bruises, redness, blisters, or sores.  Do not use any products that contain nicotine or tobacco, such as cigarettes and e-cigarettes. If you need help quitting, ask your health care provider. This information is not intended to replace advice given to you by your health care provider. Make sure you discuss any questions you have with your health care provider. Document Revised: 01/28/2018 Document Reviewed: 01/20/2017 [employer] Patient Education  2020 [employer] Corns and Calluses Corns are small areas of thickened skin that occur on the top, sides, or tip of a toe. They contain a cone-shaped core with a point that can press on a nerve below. This causes pain.  Calluses are areas of thickened skin that can occur anywhere on the body, including the hands, fingers, palms, soles of the feet, and heels. Calluses are usually larger than corns. What are the causes? Corns and calluses are caused by rubbing (friction) or pressure, such as from shoes that are too tight or do not fit properly. What increases the risk? Corns are more likely to develop in people who have misshapen toes (toe deformities), such as hammer toes. Calluses can occur with friction to any area of the skin. They are more  likely to develop in people who:  Work with their hands.  Wear shoes that fit poorly, are too tight, or are high-heeled.  Have toe deformities. What are the signs or symptoms? Symptoms of a corn or callus include:  A hard growth on the skin.  Pain or tenderness under the skin.  Redness and swelling.  Increased discomfort while wearing tight-fitting shoes, if your feet are affected. If a corn or callus becomes infected, symptoms may include:  Redness and swelling that gets worse.  Pain.  Fluid, blood, or pus draining from the corn or callus. How is this diagnosed? Corns and calluses may be diagnosed based on your symptoms, your medical history, and a physical exam. How is this treated? Treatment for corns and calluses may include:  Removing the cause of the friction or pressure. This may involve: ? Changing your shoes. ? Wearing shoe inserts (orthotics) or other protective layers in your shoes, such as a corn pad. ? Wearing gloves.  Applying medicine to the skin (topical medicine) to help soften skin in the hardened,  thickened areas.  Removing layers of dead skin with a file to reduce the size of the corn or callus.  Removing the corn or callus with a scalpel or laser.  Taking antibiotic medicines, if your corn or callus is infected.  Having surgery, if a toe deformity is the cause. Follow these instructions at home:   Take over-the-counter and prescription medicines only as told by your health care provider.  If you were prescribed an antibiotic, take it as told by your health care provider. Do not stop taking it even if your condition starts to improve.  Wear shoes that fit well. Avoid wearing high-heeled shoes and shoes that are too tight or too loose.  Wear any padding, protective layers, gloves, or orthotics as told by your health care provider.  Soak your hands or feet and then use a file or pumice stone to soften your corn or callus. Do this as told by your  health care provider.  Check your corn or callus every day for symptoms of infection. Contact a health care provider if you:  Notice that your symptoms do not improve with treatment.  Have redness or swelling that gets worse.  Notice that your corn or callus becomes painful.  Have fluid, blood, or pus coming from your corn or callus.  Have new symptoms. Summary  Corns are small areas of thickened skin that occur on the top, sides, or tip of a toe.  Calluses are areas of thickened skin that can occur anywhere on the body, including the hands, fingers, palms, and soles of the feet. Calluses are usually larger than corns.  Corns and calluses are caused by rubbing (friction) or pressure, such as from shoes that are too tight or do not fit properly.  Treatment may include wearing any padding, protective layers, gloves, or orthotics as told by your health care provider. This information is not intended to replace advice given to you by your health care provider. Make sure you discuss any questions you have with your health care provider. Document Revised: 04/07/2019 Document Reviewed: 10/29/2017 [employer] Patient Education  2020 [employer]   Hammer Toe  Hammer toe is a change in the shape (a deformity) of your toe. The deformity causes the middle joint of your toe to stay bent. This causes pain, especially when you are wearing shoes. Hammer toe starts gradually. At first, the toe can be straightened. Gradually over time, the deformity becomes stiff and permanent. Early treatments to keep the toe straight may relieve pain. As the deformity becomes stiff and permanent, surgery may be needed to straighten the toe. What are the causes? Hammer toe is caused by abnormal bending of the toe joint that is closest to your foot. It happens gradually over time. This pulls on the muscles and connections (tendons) of the toe joint, making them weak and stiff. It is often related to wearing shoes that  are too short or narrow and do not let your toes straighten. What increases the risk? You may be at greater risk for hammer toe if you:  Are male.  Are older.  Wear shoes that are too small.  Wear high-heeled shoes that pinch your toes.  Are a Advertising account planner.  Have a second toe that is longer than your big toe (first toe).  Injure your foot or toe.  Have arthritis.  Have a family history of hammer toe.  Have a nerve or muscle disorder. What are the signs or symptoms? The main symptoms of this  condition are pain and deformity of the toe. The pain is worse when wearing shoes, walking, or running. Other symptoms may include:  Corns or calluses over the bent part of the toe or between the toes.  Redness and a burning feeling on the toe.  An open sore that forms on the top of the toe.  Not being able to straighten the toe. How is this diagnosed? This condition is diagnosed based on your symptoms and a physical exam. During the exam, your health care provider will try to straighten your toe to see how stiff the deformity is. You may also have tests, such as:  A blood test to check for rheumatoid arthritis.  An X-ray to show how severe the deformity is. How is this treated? Treatment for this condition will depend on how stiff the deformity is. Surgery is often needed. However, sometimes a hammer toe can be straightened without surgery. Treatments that do not involve surgery include:  Taping the toe into a straightened position.  Using pads and cushions to protect the toe (orthotics).  Wearing shoes that provide enough room for the toes.  Doing toe-stretching exercises at home.  Taking an NSAID to reduce pain and swelling. If these treatments do not help or the toe cannot be straightened, surgery is the next option. The most common surgeries used to straighten a hammer toe include:  Arthroplasty. In this procedure, part of the joint is removed, and that allows the toe  to straighten.  Fusion. In this procedure, cartilage between the two bones of the joint is taken out and the bones are fused together into one longer bone.  Implantation. In this procedure, part of the bone is removed and replaced with an implant to let the toe move again.  Flexor tendon transfer. In this procedure, the tendons that curl the toes down (flexor tendons) are repositioned. Follow these instructions at home:  Take over-the-counter and prescription medicines only as told by your health care provider.  Do toe straightening and stretching exercises as told by your health care provider.  Keep all follow-up visits as told by your health care provider. This is important. How is this prevented?  Wear shoes that give your toes enough room and do not cause pain.  Do not wear high-heeled shoes. Contact a health care provider if:  Your pain gets worse.  Your toe becomes red or swollen.  You develop an open sore on your toe. This information is not intended to replace advice given to you by your health care provider. Make sure you discuss any questions you have with your health care provider. Document Revised: 11/28/2017 Document Reviewed: 04/10/2016 [employer] Patient Education  2020 [employer]   Diabetes Mellitus and Foot Care Foot care is an important part of your health, especially when you have diabetes. Diabetes may cause you to have problems because of poor blood flow (circulation) to your feet and legs, which can cause your skin to:  Become thinner and drier.  Break more easily.  Heal more slowly.  Peel and crack. You may also have nerve damage (neuropathy) in your legs and feet, causing decreased feeling in them. This means that you may not notice minor injuries to your feet that could lead to more serious problems. Noticing and addressing any potential problems early is the best way to prevent future foot problems. How to care for your feet Foot hygiene  Wash  your feet daily with warm water and mild soap. Do not use hot  water. Then, pat your feet and the areas between your toes until they are completely dry. Do not soak your feet as this can dry your skin.  Trim your toenails straight across. Do not dig under them or around the cuticle. File the edges of your nails with an emery board or nail file.  Apply a moisturizing lotion or petroleum jelly to the skin on your feet and to dry, brittle toenails. Use lotion that does not contain alcohol and is unscented. Do not apply lotion between your toes. Shoes and socks  Wear clean socks or stockings every day. Make sure they are not too tight. Do not wear knee-high stockings since they may decrease blood flow to your legs.  Wear shoes that fit properly and have enough cushioning. Always look in your shoes before you put them on to be sure there are no objects inside.  To break in new shoes, wear them for just a few hours a day. This prevents injuries on your feet. Wounds, scrapes, corns, and calluses  Check your feet daily for blisters, cuts, bruises, sores, and redness. If you cannot see the bottom of your feet, use a mirror or ask someone for help.  Do not cut corns or calluses or try to remove them with medicine.  If you find a minor scrape, cut, or break in the skin on your feet, keep it and the skin around it clean and dry. You may clean these areas with mild soap and water. Do not clean the area with peroxide, alcohol, or iodine.  If you have a wound, scrape, corn, or callus on your foot, look at it several times a day to make sure it is healing and not infected. Check for: ? Redness, swelling, or pain. ? Fluid or blood. ? Warmth. ? Pus or a bad smell. General instructions  Do not cross your legs. This may decrease blood flow to your feet.  Do not use heating pads or hot water bottles on your feet. They may burn your skin. If you have lost feeling in your feet or legs, you may not know this is  happening until it is too late.  Protect your feet from hot and cold by wearing shoes, such as at the beach or on hot pavement.  Schedule a complete foot exam at least once a year (annually) or more often if you have foot problems. If you have foot problems, report any cuts, sores, or bruises to your health care provider immediately. Contact a health care provider if:  You have a medical condition that increases your risk of infection and you have any cuts, sores, or bruises on your feet.  You have an injury that is not healing.  You have redness on your legs or feet.  You feel burning or tingling in your legs or feet.  You have pain or cramps in your legs and feet.  Your legs or feet are numb.  Your feet always feel cold.  You have pain around a toenail. Get help right away if:  You have a wound, scrape, corn, or callus on your foot and: ? You have pain, swelling, or redness that gets worse. ? You have fluid or blood coming from the wound, scrape, corn, or callus. ? Your wound, scrape, corn, or callus feels warm to the touch. ? You have pus or a bad smell coming from the wound, scrape, corn, or callus. ? You have a fever. ? You have a red line going up  your leg. Summary  Check your feet every day for cuts, sores, red spots, swelling, and blisters.  Moisturize feet and legs daily.  Wear shoes that fit properly and have enough cushioning.  If you have foot problems, report any cuts, sores, or bruises to your health care provider immediately.  Schedule a complete foot exam at least once a year (annually) or more often if you have foot problems. This information is not intended to replace advice given to you by your health care provider. Make sure you discuss any questions you have with your health care provider. Document Revised: 09/08/2019 Document Reviewed: 01/17/2017 [employer] Patient Education  2020 [employer]  Hammer Toe  Hammer toe is a change in the shape (a  deformity) of your toe. The deformity causes the middle joint of your toe to stay bent. This causes pain, especially when you are wearing shoes. Hammer toe starts gradually. At first, the toe can be straightened. Gradually over time, the deformity becomes stiff and permanent. Early treatments to keep the toe straight may relieve pain. As the deformity becomes stiff and permanent, surgery may be needed to straighten the toe. What are the causes? Hammer toe is caused by abnormal bending of the toe joint that is closest to your foot. It happens gradually over time. This pulls on the muscles and connections (tendons) of the toe joint, making them weak and stiff. It is often related to wearing shoes that are too short or narrow and do not let your toes straighten. What increases the risk? You may be at greater risk for hammer toe if you:  Are male.  Are older.  Wear shoes that are too small.  Wear high-heeled shoes that pinch your toes.  Are a Advertising account planner.  Have a second toe that is longer than your big toe (first toe).  Injure your foot or toe.  Have arthritis.  Have a family history of hammer toe.  Have a nerve or muscle disorder. What are the signs or symptoms? The main symptoms of this condition are pain and deformity of the toe. The pain is worse when wearing shoes, walking, or running. Other symptoms may include:  Corns or calluses over the bent part of the toe or between the toes.  Redness and a burning feeling on the toe.  An open sore that forms on the top of the toe.  Not being able to straighten the toe. How is this diagnosed? This condition is diagnosed based on your symptoms and a physical exam. During the exam, your health care provider will try to straighten your toe to see how stiff the deformity is. You may also have tests, such as:  A blood test to check for rheumatoid arthritis.  An X-ray to show how severe the deformity is. How is this treated? Treatment  for this condition will depend on how stiff the deformity is. Surgery is often needed. However, sometimes a hammer toe can be straightened without surgery. Treatments that do not involve surgery include:  Taping the toe into a straightened position.  Using pads and cushions to protect the toe (orthotics).  Wearing shoes that provide enough room for the toes.  Doing toe-stretching exercises at home.  Taking an NSAID to reduce pain and swelling. If these treatments do not help or the toe cannot be straightened, surgery is the next option. The most common surgeries used to straighten a hammer toe include:  Arthroplasty. In this procedure, part of the joint is removed, and that  allows the toe to straighten.  Fusion. In this procedure, cartilage between the two bones of the joint is taken out and the bones are fused together into one longer bone.  Implantation. In this procedure, part of the bone is removed and replaced with an implant to let the toe move again.  Flexor tendon transfer. In this procedure, the tendons that curl the toes down (flexor tendons) are repositioned. Follow these instructions at home:  Take over-the-counter and prescription medicines only as told by your health care provider.  Do toe straightening and stretching exercises as told by your health care provider.  Keep all follow-up visits as told by your health care provider. This is important. How is this prevented?  Wear shoes that give your toes enough room and do not cause pain.  Do not wear high-heeled shoes. Contact a health care provider if:  Your pain gets worse.  Your toe becomes red or swollen.  You develop an open sore on your toe. This information is not intended to replace advice given to you by your health care provider. Make sure you discuss any questions you have with your health care provider. Document Revised: 11/28/2017 Document Reviewed: 04/10/2016 Elsevier Patient Education  2020  [employer].

## 2020-06-09 NOTE — Progress Notes (Signed)
Subjective: Calvin Shields presents today for preventative diabetic foot care, for annual diabetic foot examination and at risk foot care. Patient has h/o amputation of L hallux.  Risk factors: NIDDM with neuropathy (gabapentin, metformin, Victoza), hyperlipidemia (pravastatin), history of ulceration left hallux with subsequent partial hallux amputation left hallux in 2018.  Past Medical History:  Diagnosis Date  . Diabetes mellitus without complication ([place])   . Hyperlipidemia   . Hypertension     Patient Active Problem List   Diagnosis Date Noted  . Primary osteoarthritis of knees, bilateral 05/13/2018  . Osteomyelitis ([place]) 09/03/2017  . Diabetic ulcer of toe of left foot associated with type 2 diabetes mellitus, with fat layer exposed ([place]) 08/13/2017  . Onychomycosis 05/24/2017  . Morbid obesity due to excess calories ([place]) 04/23/2016  . Type II diabetes mellitus, uncontrolled ([place]) 01/16/2016  . Mixed hyperlipidemia 01/16/2016  . Essential hypertension, benign 01/16/2016  . Blood loss anemia 01/29/2014  . Diverticular disease 01/29/2014  . Nonalcoholic fatty liver disease 01/29/2014  . PNA (pneumonia) 01/26/2014  . DM (diabetes mellitus), type 2, uncontrolled w/neurologic complication ([place]) 94/85/4627  . HTN (hypertension) 01/21/2014  . Posterior tibial tendinitis, unspecified leg 01/21/2014  . Status post ankle fusion 01/21/2014  . TENDINITIS, LEFT KNEE 09/12/2009  . KNEE, ARTHRITIS, DEGEN./OSTEO 06/14/2009  . KNEE PAIN 06/14/2009    Past Surgical History:  Procedure Laterality Date  . AMPUTATION Left 09/08/2017   Procedure: PARTIAL TOE AMPUTATION LEFT HALLUX;  Surgeon: Tyson Babinski, DPM;  Location: [facility];  Service: Podiatry;  Laterality: Left;  . right leg surgery       Current Outpatient Medications on File Prior to Visit  Medication Sig Dispense Refill  . ACCU-CHEK FASTCLIX LANCETS MISC Use to test blood glucose 2 times a day. 5 each 2  . allopurinol  (ZYLOPRIM) 100 MG tablet Take 100 mg by mouth daily.    Marland Kitchen aspirin EC 81 MG tablet Take 81 mg by mouth daily.    . cephALEXin (KEFLEX) 500 MG capsule Take 1 capsule (500 mg total) by mouth 3 (three) times daily. 45 capsule 1  . cephALEXin (KEFLEX) 500 MG capsule Take 1 capsule (500 mg total) by mouth 3 (three) times daily. 30 capsule 1  . ciclopirox (PENLAC) 8 % solution     . CONTOUR NEXT TEST test strip USE TO TEST BLOOD SUGAR TWICE DAILY AS DIRECTED. 100 each 5  . gabapentin (NEURONTIN) 300 MG capsule     . GLOBAL EASE INJECT PEN NEEDLES 31G X 8 MM MISC USE AS DIRECTED. 60 each 0  . glucose blood (ACCU-CHEK GUIDE) test strip Use as instructed bid 100 each 5  . ibuprofen (ADVIL,MOTRIN) 800 MG tablet Take 800 mg by mouth every 8 (eight) hours as needed.    Marland Kitchen lisinopril-hydrochlorothiazide (PRINZIDE,ZESTORETIC) 20-25 MG per tablet Take 1 tablet by mouth daily.    . metFORMIN (GLUCOPHAGE) 500 MG tablet Take 1 tablet (500 mg total) by mouth daily with breakfast. 90 tablet 1  . metFORMIN (GLUCOPHAGE-XR) 500 MG 24 hr tablet     . Multiple Vitamin (MULTIVITAMIN WITH MINERALS) TABS tablet Take 1 tablet by mouth daily.    . pravastatin (PRAVACHOL) 40 MG tablet TAKE 1 TABLET BY MOUTH ONCE DAILY. 90 tablet 1  . tamsulosin (FLOMAX) 0.4 MG CAPS capsule     . terbinafine (LAMISIL) 250 MG tablet     . traMADol (ULTRAM) 50 MG tablet     . VICTOZA 18 MG/3ML SOPN INJECT 1.8 MG SUBQ EVERY DAY.  9 mL 2   No current facility-administered medications on file prior to visit.     Allergies  Allergen Reactions  . Codeine Nausea And Vomiting  . Aspirin Other (See Comments)    Other reaction(s): Abdominal Pain, Other (See Comments)   . Ibuprofen Other (See Comments)    Other reaction(s): Abdominal Pain, Other (See Comments)     Social History   Occupational History  . Not on file  Tobacco Use  . Smoking status: Never Smoker  . Smokeless tobacco: Never Used  Vaping Use  . Vaping Use: Never used   Substance and Sexual Activity  . Alcohol use: No  . Drug use: No  . Sexual activity: Not on file    History reviewed. No pertinent family history.  Immunization History  Administered Date(s) Administered  . Influenza,inj,Quad PF,6+ Mos 10/13/2017, 10/06/2019     Objective: There were no vitals filed for this visit.  Calvin Shields is a pleasant 64 y.o. male in NAD. AAO X 3.  Vascular Examination: Capillary fill time to digits <3 seconds b/l lower extremities. Palpable DP pulses b/l. Faintly palpable PT pulses b/l. Pedal hair sparse b/l. Skin temperature gradient within normal limits b/l. No pain with calf compression b/l. Trace edema noted b/l feet.  Dermatological Examination: Pedal skin with normal turgor, texture and tone bilaterally. No open wounds bilaterally. No interdigital macerations bilaterally. Toenails 1-5 right, L 2nd toe, L 3rd toe, L 4th toe and L 5th toe elongated, discolored, dystrophic, thickened, and crumbly with subungual debris and tenderness to dorsal palpation. Hyperkeratotic lesion(s) L 2nd toe, L 5th toe and plantar aspect right heel.  No erythema, no edema, no drainage, no flocculence.  Musculoskeletal Examination: Normal muscle strength 5/5 to all lower extremity muscle groups bilaterally. No pain crepitus or joint limitation noted with ROM b/l. Lower extremity amputation(s): partial amputation of L hallux.  Footwear Assessment: Does the patient wear appropriate shoes? Yes. Does the patient need inserts/orthotics? Yes.  Neurological Examination: Protective sensation diminished with 10g monofilament b/l. Vibratory sensation decreased b/l. Proprioception intact bilaterally.  Hemoglobin A1C 04/13/2020 12/20/2019  HGBA1C 6.2 5.9  Some recent data might be hidden   Assessment: 1. Onychomycosis   2. Corns and callosities   3. Status post amputation of great toe, left (HCC)   4. Diabetic peripheral neuropathy associated with type 2 diabetes mellitus  (HCC)   5. Encounter for diabetic foot exam (HCC)     Risk Categorization: High Risk  Patient has one or more of the following: Loss of protective sensation History of foot ulcer History of amputation: partial hallux amputation left hallux  Plan: -Examined patient. -Diabetic foot examination performed on today's visit. -Discussed diabetic foot care principles. Literature dispensed on today. -Toenails 1-5 right, L 2nd toe, L 3rd toe, L 4th toe and L 5th toe debrided in length and girth without iatrogenic bleeding with sterile nail nipper and dremel.  -Corn(s) L 5th toe and callus(es) L 2nd toe and plantar aspect right heel were pared utilizing sterile scalpel blade without incident. Total number debrided =3. -Patient to report any pedal injuries to medical professional immediately. -Patient to continue soft, supportive shoe gear daily. Start procedure for diabetic shoes. Patient qualifies based on diagnoses. -Patient to continue soft, supportive shoe gear daily. -Patient/POA to call should there be question/concern in the interim.  Return in about 3 months (around 09/05/2020) for diabetic nail and callus trim.

## 2020-06-22 ENCOUNTER — Ambulatory Visit: Payer: Medicare HMO | Admitting: Orthotics

## 2020-06-22 ENCOUNTER — Other Ambulatory Visit: Payer: Self-pay

## 2020-06-22 DIAGNOSIS — M2042 Other hammer toe(s) (acquired), left foot: Secondary | ICD-10-CM

## 2020-06-22 DIAGNOSIS — L84 Corns and callosities: Secondary | ICD-10-CM

## 2020-06-22 DIAGNOSIS — Z89412 Acquired absence of left great toe: Secondary | ICD-10-CM

## 2020-06-22 DIAGNOSIS — E1142 Type 2 diabetes mellitus with diabetic polyneuropathy: Secondary | ICD-10-CM

## 2020-06-22 NOTE — Progress Notes (Signed)

## 2020-06-26 DIAGNOSIS — Z0100 Encounter for examination of eyes and vision without abnormal findings: Secondary | ICD-10-CM | POA: Diagnosis not present

## 2020-06-26 DIAGNOSIS — Z7984 Long term (current) use of oral hypoglycemic drugs: Secondary | ICD-10-CM | POA: Diagnosis not present

## 2020-06-26 DIAGNOSIS — H5210 Myopia, unspecified eye: Secondary | ICD-10-CM | POA: Diagnosis not present

## 2020-06-26 DIAGNOSIS — E119 Type 2 diabetes mellitus without complications: Secondary | ICD-10-CM | POA: Diagnosis not present

## 2020-06-26 DIAGNOSIS — H2513 Age-related nuclear cataract, bilateral: Secondary | ICD-10-CM | POA: Diagnosis not present

## 2020-06-26 LAB — HM DIABETES EYE EXAM

## 2020-08-14 DIAGNOSIS — I10 Essential (primary) hypertension: Secondary | ICD-10-CM | POA: Diagnosis not present

## 2020-08-14 DIAGNOSIS — E782 Mixed hyperlipidemia: Secondary | ICD-10-CM | POA: Diagnosis not present

## 2020-08-14 DIAGNOSIS — N400 Enlarged prostate without lower urinary tract symptoms: Secondary | ICD-10-CM | POA: Diagnosis not present

## 2020-08-14 DIAGNOSIS — E1142 Type 2 diabetes mellitus with diabetic polyneuropathy: Secondary | ICD-10-CM | POA: Diagnosis not present

## 2020-08-14 DIAGNOSIS — R5382 Chronic fatigue, unspecified: Secondary | ICD-10-CM | POA: Diagnosis not present

## 2020-08-14 LAB — LIPID PANEL
Cholesterol: 167 (ref 0–200)
HDL: 49 (ref 35–70)
LDL Cholesterol: 100
Triglycerides: 100 (ref 40–160)

## 2020-08-14 LAB — BASIC METABOLIC PANEL
BUN: 14 (ref 4–21)
Creatinine: 1.4 — AB (ref 0.6–1.3)
Potassium: 3.9 (ref 3.4–5.3)

## 2020-08-14 LAB — HEMOGLOBIN A1C: Hemoglobin A1C: 6.4

## 2020-08-14 LAB — COMPREHENSIVE METABOLIC PANEL
Calcium: 9.7 (ref 8.7–10.7)
GFR calc Af Amer: 63
GFR calc non Af Amer: 54

## 2020-08-21 DIAGNOSIS — E782 Mixed hyperlipidemia: Secondary | ICD-10-CM | POA: Diagnosis not present

## 2020-08-21 DIAGNOSIS — N400 Enlarged prostate without lower urinary tract symptoms: Secondary | ICD-10-CM | POA: Diagnosis not present

## 2020-08-21 DIAGNOSIS — G6289 Other specified polyneuropathies: Secondary | ICD-10-CM | POA: Diagnosis not present

## 2020-08-21 DIAGNOSIS — Z0001 Encounter for general adult medical examination with abnormal findings: Secondary | ICD-10-CM | POA: Diagnosis not present

## 2020-08-21 DIAGNOSIS — Z6835 Body mass index (BMI) 35.0-35.9, adult: Secondary | ICD-10-CM | POA: Diagnosis not present

## 2020-08-21 DIAGNOSIS — I10 Essential (primary) hypertension: Secondary | ICD-10-CM | POA: Diagnosis not present

## 2020-08-21 DIAGNOSIS — M1712 Unilateral primary osteoarthritis, left knee: Secondary | ICD-10-CM | POA: Diagnosis not present

## 2020-08-28 ENCOUNTER — Telehealth: Payer: Self-pay | Admitting: "Endocrinology

## 2020-08-28 NOTE — Telephone Encounter (Signed)
Pt is calling and states that VICTOZA 18 MG/3ML SOPN   is too expensive and needs something cheaper called in.  [facility] - [place], [place] - [address] Phone:  [phone]  Fax:  [phone]

## 2020-08-28 NOTE — Telephone Encounter (Signed)
Please advise 

## 2020-08-29 NOTE — Telephone Encounter (Signed)
We will discuss during his next appt.

## 2020-08-29 NOTE — Telephone Encounter (Signed)
Returned call to pt, had to leave voicemail. 

## 2020-09-11 ENCOUNTER — Other Ambulatory Visit: Payer: Self-pay

## 2020-09-11 ENCOUNTER — Encounter: Payer: Self-pay | Admitting: "Endocrinology

## 2020-09-11 ENCOUNTER — Ambulatory Visit (INDEPENDENT_AMBULATORY_CARE_PROVIDER_SITE_OTHER): Payer: Medicare HMO | Admitting: "Endocrinology

## 2020-09-11 VITALS — BP 106/62 | HR 84 | Ht 69.5 in | Wt 235.2 lb

## 2020-09-11 DIAGNOSIS — E782 Mixed hyperlipidemia: Secondary | ICD-10-CM | POA: Diagnosis not present

## 2020-09-11 DIAGNOSIS — I10 Essential (primary) hypertension: Secondary | ICD-10-CM

## 2020-09-11 DIAGNOSIS — E1165 Type 2 diabetes mellitus with hyperglycemia: Secondary | ICD-10-CM | POA: Diagnosis not present

## 2020-09-11 MED ORDER — GLIPIZIDE ER 5 MG PO TB24: 5.0000 mg | ORAL_TABLET | Freq: Every day | ORAL | 1 refills | Status: DC

## 2020-09-11 NOTE — Progress Notes (Signed)
09/11/2020   Endocrinology follow-up note   Subjective:    Patient ID: Calvin Shields, male    DOB: Jun 29, 1956, PCP Calvin Shields., MD   Past Medical History:  Diagnosis Date  . Diabetes mellitus without complication (HCC)   . Hyperlipidemia   . Hypertension    Past Surgical History:  Procedure Laterality Date  . AMPUTATION Left 09/08/2017   Procedure: PARTIAL TOE AMPUTATION LEFT HALLUX;  Surgeon: Erskine Emery, DPM;  Location: [facility];  Service: Podiatry;  Laterality: Left;  . right leg surgery       History reviewed. No pertinent family history.  Social History   Socioeconomic History  . Marital status: Married    Spouse name: Not on file  . Number of children: Not on file  . Years of education: Not on file  . Highest education level: Not on file  Occupational History  . Not on file  Tobacco Use  . Smoking status: Never Smoker  . Smokeless tobacco: Never Used  Vaping Use  . Vaping Use: Never used  Substance and Sexual Activity  . Alcohol use: No  . Drug use: No  . Sexual activity: Not on file  Other Topics Concern  . Not on file  Social History Narrative  . Not on file   Social Determinants of Health   Financial Resource Strain:   . Difficulty of Paying Living Expenses: Not on file  Food Insecurity:   . Worried About Programme researcher, broadcasting/film/video in the Last Year: Not on file  . Ran Out of Food in the Last Year: Not on file  Transportation Needs:   . Lack of Transportation (Medical): Not on file  . Lack of Transportation (Non-Medical): Not on file  Physical Activity:   . Days of Exercise per Week: Not on file  . Minutes of Exercise per Session: Not on file  Stress:   . Feeling of Stress : Not on file  Social Connections:   . Frequency of Communication with Friends and Family: Not on file  . Frequency of Social Gatherings with Friends and Family: Not on file  . Attends Religious Services: Not on file  . Active Member of Clubs or Organizations: Not  on file  . Attends Banker Meetings: Not on file  . Marital Status: Not on file   Outpatient Encounter Medications as of 09/11/2020  Medication Sig  . ACCU-CHEK FASTCLIX LANCETS MISC Use to test blood glucose 2 times a day.  . allopurinol (ZYLOPRIM) 100 MG tablet Take 100 mg by mouth daily.  Marland Kitchen aspirin EC 81 MG tablet Take 81 mg by mouth daily.  . cephALEXin (KEFLEX) 500 MG capsule Take 1 capsule (500 mg total) by mouth 3 (three) times daily.  . cephALEXin (KEFLEX) 500 MG capsule Take 1 capsule (500 mg total) by mouth 3 (three) times daily.  . ciclopirox (PENLAC) 8 % solution   . CONTOUR NEXT TEST test strip USE TO TEST BLOOD SUGAR TWICE DAILY AS DIRECTED.  Marland Kitchen gabapentin (NEURONTIN) 300 MG capsule   . glipiZIDE (GLUCOTROL XL) 5 MG 24 hr tablet Take 1 tablet (5 mg total) by mouth daily with breakfast.  . GLOBAL EASE INJECT PEN NEEDLES 31G X 8 MM MISC USE AS DIRECTED.  Marland Kitchen glucose blood (ACCU-CHEK GUIDE) test strip Use as instructed bid  . ibuprofen (ADVIL,MOTRIN) 800 MG tablet Take 800 mg by mouth every 8 (eight) hours as needed.  Marland Kitchen lisinopril-hydrochlorothiazide (PRINZIDE,ZESTORETIC) 20-25 MG per tablet Take 1 tablet by  mouth daily.  . metFORMIN (GLUCOPHAGE) 500 MG tablet Take 1 tablet (500 mg total) by mouth daily with breakfast.  . Multiple Vitamin (MULTIVITAMIN WITH MINERALS) TABS tablet Take 1 tablet by mouth daily.  . pravastatin (PRAVACHOL) 40 MG tablet TAKE 1 TABLET BY MOUTH ONCE DAILY.  . tamsulosin (FLOMAX) 0.4 MG CAPS capsule   . terbinafine (LAMISIL) 250 MG tablet   . traMADol (ULTRAM) 50 MG tablet   . [DISCONTINUED] metFORMIN (GLUCOPHAGE-XR) 500 MG 24 hr tablet   . [DISCONTINUED] VICTOZA 18 MG/3ML SOPN INJECT 1.8 MG SUBQ EVERY DAY.   No facility-administered encounter medications on file as of 09/11/2020.   ALLERGIES: Allergies  Allergen Reactions  . Codeine Nausea And Vomiting  . Aspirin Other (See Comments)    Other reaction(s): Abdominal Pain, Other (See  Comments)   . Ibuprofen Other (See Comments)    Other reaction(s): Abdominal Pain, Other (See Comments)    VACCINATION STATUS: Immunization History  Administered Date(s) Administered  . Influenza,inj,Quad PF,6+ Mos 10/13/2017, 10/06/2019    Diabetes He presents for his follow-up diabetic visit. He has type 2 diabetes mellitus. His disease course has been stable. There are no hypoglycemic associated symptoms. Pertinent negatives for hypoglycemia include no confusion, headaches, pallor or seizures. Pertinent negatives for diabetes include no chest pain, no fatigue, no polydipsia, no polyphagia, no polyuria and no weakness. There are no hypoglycemic complications. Symptoms are stable. Diabetic complications include peripheral neuropathy. Risk factors for coronary artery disease include diabetes mellitus, dyslipidemia, hypertension, male sex, obesity and sedentary lifestyle. He is compliant with treatment most of the time. His weight is fluctuating minimally. He is following a generally unhealthy diet. When asked about meal planning, he reported none. He has had a previous visit with a dietitian. He never participates in exercise. His home blood glucose trend is fluctuating minimally. (Patient does not monitor blood glucose, did not bring any logs nor meter with him.  His point-of-care A1c 6.4% remaining stable.   ) An ACE inhibitor/angiotensin II receptor blocker is being taken. Eye exam is current.  Hyperlipidemia This is a chronic problem. The current episode started more than 1 year ago. The problem is uncontrolled. Exacerbating diseases include diabetes and obesity. Pertinent negatives include no chest pain, myalgias or shortness of breath. Current antihyperlipidemic treatment includes statins. Compliance problems include adherence to diet.  Risk factors for coronary artery disease include diabetes mellitus, dyslipidemia, hypertension, male sex, obesity and a sedentary lifestyle.   Hypertension This is a chronic problem. The current episode started more than 1 year ago. Pertinent negatives include no chest pain, headaches, neck pain, palpitations or shortness of breath. Risk factors for coronary artery disease include dyslipidemia, diabetes mellitus, obesity, male gender and sedentary lifestyle. Past treatments include ACE inhibitors.     Review of systems Limited as above.   Objective:    BP 106/62   Pulse 84   Ht 5' 9.5" (1.765 m)   Wt 235 lb 3.2 oz (106.7 kg)   BMI 34.24 kg/m   Wt Readings from Last 3 Encounters:  09/11/20 235 lb 3.2 oz (106.7 kg)  05/01/20 229 lb 9.6 oz (104.1 kg)  03/08/19 238 lb (108 kg)        Recent Results (from the past 2160 hour(s))  HM DIABETES EYE EXAM     Status: None   Collection Time: 06/26/20 12:00 AM  Result Value Ref Range   HM Diabetic Eye Exam No Retinopathy No Retinopathy  Basic metabolic panel     Status:  Abnormal   Collection Time: 08/14/20 12:00 AM  Result Value Ref Range   BUN 14 4 - 21   Creatinine 1.4 (A) 0.6 - 1.3   Potassium 3.9 3.4 - 5.3  Comprehensive metabolic panel     Status: None   Collection Time: 08/14/20 12:00 AM  Result Value Ref Range   GFR calc Af Amer 63    GFR calc non Af Amer 54    Calcium 9.7 8.7 - 10.7  Lipid panel     Status: None   Collection Time: 08/14/20 12:00 AM  Result Value Ref Range   Triglycerides 100 40 - 160   Cholesterol 167 0 - 200   HDL 49 35 - 70   LDL Cholesterol 100   Hemoglobin A1c     Status: None   Collection Time: 08/14/20 12:00 AM  Result Value Ref Range   Hemoglobin A1C 6.4    Lipid Panel     Component Value Date/Time   CHOL 167 08/14/2020 0000   TRIG 100 08/14/2020 0000   HDL 49 08/14/2020 0000   CHOLHDL 4.1 10/06/2018 1310   VLDL 29 04/15/2016 0836   LDLCALC 100 08/14/2020 0000   LDLCALC 114 (H) 10/06/2018 1310    Assessment & Plan:   1. Diabetes mellitus without complication (HCC)  - His diabetes is  complicated by peripheral  neuropathy , peripheral arterial disease, Charcot's deformities and patient remains at a high risk for more acute and chronic complications of diabetes which include CAD, CVA, CKD, retinopathy, and neuropathy. These are all discussed in detail with the patient.  Patient does not monitor blood glucose, did not bring any logs nor meter with him.  His point-of-care A1c 6.4% remaining stable.    - Recent labs reviewed with him, showing normal renal function.  - I have re-counseled the patient on diet management and  and weight loss by adopting a carbohydrate restricted / protein rich  Diet.  -He has gained weight, admits to dietary indiscretion.   - he  admits there is a room for improvement in his diet and drink choices. -  Suggestion is made for him to avoid simple carbohydrates  from his diet including Cakes, Sweet Desserts / Pastries, Ice Cream, Soda (diet and regular), Sweet Tea, Candies, Chips, Cookies, Sweet Pastries,  Store Bought Juices, Alcohol in Excess of  1-2 drinks a day, Artificial Sweeteners, Coffee Creamer, and "Sugar-free" Products. This will help patient to have stable blood glucose profile and potentially avoid unintended weight gain.   - Patient is advised to stick to a routine mealtimes to eat 3 meals  a day and avoid unnecessary snacks ( to snack only to correct hypoglycemia).  - I have approached patient with the following individualized plan to manage diabetes and patient agrees.  -Given his presentation with stable A1c of 6.4%, he will not need insulin treatment at this time.  He is not affording the incretin therapy, stay off of Victoza.  He is currently only on Metformin 500 mg ER p.o. daily after breakfast.  I discussed and added glipizide 5 mg p.o. daily at breakfast.  He agrees to monitor blood glucose at least twice a day-daily before breakfast and at bedtime.    - Patient specific target  for A1c; LDL, HDL, Triglycerides, and  Waist Circumference were discussed in  detail.  2) BP/HTN:  -His blood pressure is controlled to target.   He is advised to continue his current blood pressure medications including lisinopril-hydrochlorthiazide 20-25 mg  p.o. daily.    3) Lipids/HPL: His recent lipid panel revealed uncontrolled LDL at 114.  He is advised to continue pravastatin 40 mg p.o. nightly.   Side effects and precautions discussed with him.   4)  Weight/Diet: His BMI is 34.24-a candidate for modest weight loss.  CDE consult in progress, exercise, and carbohydrates information provided.  5) Chronic Care/Health Maintenance:  -Patient is on ACEI/ARB and Statin medications and encouraged to continue to follow up with Ophthalmology, Podiatrist at least yearly or according to recommendations, and advised to  stay away from smoking. I have recommended yearly flu vaccine and pneumonia vaccination at least every 5 years; moderate intensity exercise for up to 150 minutes weekly; and  sleep for at least 7 hours a day.  - I advised patient to maintain close follow up with Shields, Calvin B., MD for primary care needs.      - Time spenLouie Bostont on this patient care encounter:  35 min, of which > 50% was spent in  counseling and the rest reviewing his blood glucose logs , discussing his hypoglycemia and hyperglycemia episodes, reviewing his current and  previous labs / studies  ( including abstraction from other facilities) and medications  doses and developing a  long term treatment plan and documenting his care.   Please refer to Patient Instructions for Blood Glucose Monitoring and Insulin/Medications Dosing Guide"  in media tab for additional information. Please  also refer to " Patient Self Inventory" in the Media  tab for reviewed elements of pertinent patient history.  [facility] participated in the discussions, expressed understanding, and voiced agreement with the above plans.  All questions were answered to his satisfaction. he is encouraged to contact clinic should  he have any questions or concerns prior to his return visit.   Follow up plan: -Return in about 6 months (around 03/11/2021) for F/U with Pre-visit Labs, Meter, Logs, A1c here.Calvin Shields.  Calvin Shields Calvin Spittler, MD Phone: [phone]  Fax: [phone]  -  This note was partially dictated with voice recognition software. Similar sounding words can be transcribed inadequately or may not  be corrected upon review.  09/11/2020, 2:11 PM

## 2020-09-11 NOTE — Patient Instructions (Signed)

## 2020-09-18 ENCOUNTER — Encounter: Payer: Self-pay | Admitting: Podiatry

## 2020-09-18 ENCOUNTER — Ambulatory Visit: Payer: Medicare HMO | Admitting: Podiatry

## 2020-09-18 ENCOUNTER — Other Ambulatory Visit: Payer: Self-pay

## 2020-09-18 DIAGNOSIS — L84 Corns and callosities: Secondary | ICD-10-CM

## 2020-09-18 DIAGNOSIS — Z89412 Acquired absence of left great toe: Secondary | ICD-10-CM | POA: Diagnosis not present

## 2020-09-18 DIAGNOSIS — E1142 Type 2 diabetes mellitus with diabetic polyneuropathy: Secondary | ICD-10-CM

## 2020-09-18 DIAGNOSIS — B351 Tinea unguium: Secondary | ICD-10-CM

## 2020-09-21 NOTE — Progress Notes (Signed)
Subjective: Calvin Shields presents today for preventative diabetic foot care, for annual diabetic foot examination and at risk foot care. Patient has h/o amputation of L hallux.  Risk factors: NIDDM with neuropathy (gabapentin, metformin, Victoza), hyperlipidemia (pravastatin), history of ulceration left hallux with subsequent partial hallux amputation left hallux in 2018.  Endocrinologist is Dr. Maryella Shivers. PCP is Dr. Donzetta Sprung.   He is inquiring about the status of his diabetic shoes on today's visit.  Past Medical History:  Diagnosis Date  . Diabetes mellitus without complication (HCC)   . Hyperlipidemia   . Hypertension     Patient Active Problem List   Diagnosis Date Noted  . Primary osteoarthritis of knees, bilateral 05/13/2018  . Osteomyelitis (HCC) 09/03/2017  . Diabetic ulcer of toe of left foot associated with type 2 diabetes mellitus, with fat layer exposed (HCC) 08/13/2017  . Onychomycosis 05/24/2017  . Morbid obesity due to excess calories (HCC) 04/23/2016  . Type II diabetes mellitus, uncontrolled (HCC) 01/16/2016  . Mixed hyperlipidemia 01/16/2016  . Essential hypertension, benign 01/16/2016  . Blood loss anemia 01/29/2014  . Diverticular disease 01/29/2014  . Nonalcoholic fatty liver disease 01/29/2014  . PNA (pneumonia) 01/26/2014  . DM (diabetes mellitus), type 2, uncontrolled w/neurologic complication (HCC) 01/25/2014  . HTN (hypertension) 01/21/2014  . Posterior tibial tendinitis, unspecified leg 01/21/2014  . Status post ankle fusion 01/21/2014  . TENDINITIS, LEFT KNEE 09/12/2009  . Arthritis 06/14/2009  . Knee pain 06/14/2009    Past Surgical History:  Procedure Laterality Date  . AMPUTATION Left 09/08/2017   Procedure: PARTIAL TOE AMPUTATION LEFT HALLUX;  Surgeon: Erskine Emery, DPM;  Location: [facility];  Service: Podiatry;  Laterality: Left;  . right leg surgery       Current Outpatient Medications on File Prior to Visit  Medication Sig  Dispense Refill  . ACCU-CHEK FASTCLIX LANCETS MISC Use to test blood glucose 2 times a day. 5 each 2  . acetaminophen-codeine (TYLENOL #3) 300-30 MG tablet     . allopurinol (ZYLOPRIM) 100 MG tablet Take 100 mg by mouth daily.    Marland Kitchen amoxicillin (AMOXIL) 500 MG capsule     . aspirin EC 81 MG tablet Take 81 mg by mouth daily.    . cephALEXin (KEFLEX) 500 MG capsule Take 1 capsule (500 mg total) by mouth 3 (three) times daily. 45 capsule 1  . cephALEXin (KEFLEX) 500 MG capsule Take 1 capsule (500 mg total) by mouth 3 (three) times daily. 30 capsule 1  . ciclopirox (PENLAC) 8 % solution     . CONTOUR NEXT TEST test strip USE TO TEST BLOOD SUGAR TWICE DAILY AS DIRECTED. 100 each 5  . gabapentin (NEURONTIN) 300 MG capsule     . glipiZIDE (GLUCOTROL XL) 5 MG 24 hr tablet Take 1 tablet (5 mg total) by mouth daily with breakfast. 90 tablet 1  . GLOBAL EASE INJECT PEN NEEDLES 31G X 8 MM MISC USE AS DIRECTED. 60 each 0  . glucose blood (ACCU-CHEK GUIDE) test strip Use as instructed bid 100 each 5  . HYDROcodone-acetaminophen (NORCO/VICODIN) 5-325 MG tablet     . ibuprofen (ADVIL,MOTRIN) 800 MG tablet Take 800 mg by mouth every 8 (eight) hours as needed.    Marland Kitchen lisinopril-hydrochlorothiazide (PRINZIDE,ZESTORETIC) 20-25 MG per tablet Take 1 tablet by mouth daily.    . metFORMIN (GLUCOPHAGE) 500 MG tablet Take 1 tablet (500 mg total) by mouth daily with breakfast. 90 tablet 1  . Multiple Vitamin (MULTIVITAMIN WITH MINERALS) TABS tablet Take  1 tablet by mouth daily.    . pantoprazole (PROTONIX) 40 MG tablet     . pravastatin (PRAVACHOL) 40 MG tablet TAKE 1 TABLET BY MOUTH ONCE DAILY. 90 tablet 1  . tamsulosin (FLOMAX) 0.4 MG CAPS capsule     . terbinafine (LAMISIL) 250 MG tablet     . traMADol (ULTRAM) 50 MG tablet      No current facility-administered medications on file prior to visit.     Allergies  Allergen Reactions  . Codeine Nausea And Vomiting  . Aspirin Other (See Comments)    Other  reaction(s): Abdominal Pain, Other (See Comments)   . Ibuprofen Other (See Comments)    Other reaction(s): Abdominal Pain, Other (See Comments)     Social History   Occupational History  . Not on file  Tobacco Use  . Smoking status: Never Smoker  . Smokeless tobacco: Never Used  Vaping Use  . Vaping Use: Never used  Substance and Sexual Activity  . Alcohol use: No  . Drug use: No  . Sexual activity: Not on file    History reviewed. No pertinent family history.  Immunization History  Administered Date(s) Administered  . Influenza,inj,Quad PF,6+ Mos 10/13/2017, 10/06/2019     Objective: There were no vitals filed for this visit.  Calvin Shields is a pleasant 64 y.o. African American male, morbidly obese,  in NAD. AAO X 3.  Vascular Examination: Capillary fill time to digits <3 seconds b/l lower extremities. Palpable DP pulses b/l. Faintly palpable PT pulses b/l. Pedal hair sparse b/l. Skin temperature gradient within normal limits b/l. No pain with calf compression b/l. Trace edema noted b/l feet.  Dermatological Examination: Pedal skin with normal turgor, texture and tone bilaterally. No open wounds bilaterally. No interdigital macerations bilaterally. Toenails 1-5 right, L 2nd toe, L 3rd toe, L 4th toe and L 5th toe elongated, discolored, dystrophic, thickened, and crumbly with subungual debris and tenderness to dorsal palpation. Hyperkeratotic lesion(s) L 2nd toe, L 5th toe. Porokeratotic lesion plantar aspect right heel.  No erythema, no edema, no drainage, no flocculence.  Musculoskeletal Examination: Normal muscle strength 5/5 to all lower extremity muscle groups bilaterally. No pain crepitus or joint limitation noted with ROM b/l. Hammertoes noted to the 2-5 bilaterally. Lower extremity amputation(s): partial amputation of L hallux.  Neurological Examination: Protective sensation diminished with 10g monofilament b/l. Vibratory sensation decreased b/l. Proprioception  intact bilaterally.  Hemoglobin A1C 08/14/2020 04/13/2020 12/20/2019  HGBA1C 6.4 6.2 5.9  Some recent data might be hidden   Assessment: 1. Onychomycosis   2. Corns and callosities   3. Status post amputation of great toe, left (HCC)   4. Diabetic peripheral neuropathy associated with type 2 diabetes mellitus (HCC)     Plan: -Examined patient. -Dawn from our O & P Department informed Calvin Shields our office has not received signed diabetic shoe certification paperwork from his PCP.  -Continue diabetic foot care principles. -Toenails 1-5 right, L 2nd toe, L 3rd toe, L 4th toe and L 5th toe debrided in length and girth without iatrogenic bleeding with sterile nail nipper and dremel.  -Corn(s) L 5th toe and callus(es) L 2nd toe and porokeratotic lesion plantar aspect of right heel  were pared utilizing sterile scalpel blade without incident. Total number debrided =3. -Patient to report any pedal injuries to medical professional immediately. -Patient to continue soft, supportive shoe gear daily. Currently going through process of obtaining diabetic shoes. Patient qualifies based on diagnoses. -Patient/POA to call should there be  question/concern in the interim.  Return in about 3 months (around 12/18/2020).

## 2020-10-02 DIAGNOSIS — M170 Bilateral primary osteoarthritis of knee: Secondary | ICD-10-CM | POA: Diagnosis not present

## 2020-10-09 DIAGNOSIS — Z23 Encounter for immunization: Secondary | ICD-10-CM | POA: Diagnosis not present

## 2020-11-16 ENCOUNTER — Other Ambulatory Visit: Payer: Self-pay

## 2020-11-16 ENCOUNTER — Ambulatory Visit (INDEPENDENT_AMBULATORY_CARE_PROVIDER_SITE_OTHER): Payer: Medicare HMO | Admitting: Orthotics

## 2020-11-16 DIAGNOSIS — M2042 Other hammer toe(s) (acquired), left foot: Secondary | ICD-10-CM | POA: Diagnosis not present

## 2020-11-16 DIAGNOSIS — Z89412 Acquired absence of left great toe: Secondary | ICD-10-CM

## 2020-11-16 DIAGNOSIS — L02619 Cutaneous abscess of unspecified foot: Secondary | ICD-10-CM

## 2020-11-16 DIAGNOSIS — E119 Type 2 diabetes mellitus without complications: Secondary | ICD-10-CM

## 2020-11-16 DIAGNOSIS — L84 Corns and callosities: Secondary | ICD-10-CM

## 2020-11-16 DIAGNOSIS — L03119 Cellulitis of unspecified part of limb: Secondary | ICD-10-CM

## 2020-11-16 DIAGNOSIS — E1142 Type 2 diabetes mellitus with diabetic polyneuropathy: Secondary | ICD-10-CM

## 2020-11-16 NOTE — Progress Notes (Signed)

## 2020-12-17 DIAGNOSIS — G8929 Other chronic pain: Secondary | ICD-10-CM | POA: Diagnosis not present

## 2020-12-17 DIAGNOSIS — I10 Essential (primary) hypertension: Secondary | ICD-10-CM | POA: Diagnosis not present

## 2020-12-17 DIAGNOSIS — M25562 Pain in left knee: Secondary | ICD-10-CM | POA: Diagnosis not present

## 2020-12-17 DIAGNOSIS — E785 Hyperlipidemia, unspecified: Secondary | ICD-10-CM | POA: Diagnosis not present

## 2020-12-17 DIAGNOSIS — M1712 Unilateral primary osteoarthritis, left knee: Secondary | ICD-10-CM | POA: Diagnosis not present

## 2020-12-17 DIAGNOSIS — E119 Type 2 diabetes mellitus without complications: Secondary | ICD-10-CM | POA: Diagnosis not present

## 2020-12-17 DIAGNOSIS — Z79891 Long term (current) use of opiate analgesic: Secondary | ICD-10-CM | POA: Diagnosis not present

## 2020-12-17 DIAGNOSIS — M170 Bilateral primary osteoarthritis of knee: Secondary | ICD-10-CM | POA: Diagnosis not present

## 2020-12-17 DIAGNOSIS — M25462 Effusion, left knee: Secondary | ICD-10-CM | POA: Diagnosis not present

## 2020-12-17 DIAGNOSIS — M25561 Pain in right knee: Secondary | ICD-10-CM | POA: Diagnosis not present

## 2020-12-18 DIAGNOSIS — E782 Mixed hyperlipidemia: Secondary | ICD-10-CM | POA: Diagnosis not present

## 2020-12-18 DIAGNOSIS — E1142 Type 2 diabetes mellitus with diabetic polyneuropathy: Secondary | ICD-10-CM | POA: Diagnosis not present

## 2020-12-18 DIAGNOSIS — R5382 Chronic fatigue, unspecified: Secondary | ICD-10-CM | POA: Diagnosis not present

## 2020-12-18 DIAGNOSIS — I10 Essential (primary) hypertension: Secondary | ICD-10-CM | POA: Diagnosis not present

## 2020-12-18 LAB — BASIC METABOLIC PANEL
BUN: 17 (ref 4–21)
Creatinine: 1.2 (ref 0.6–1.3)

## 2020-12-18 LAB — COMPREHENSIVE METABOLIC PANEL
Calcium: 10 (ref 8.7–10.7)
GFR calc Af Amer: 76
GFR calc non Af Amer: 66

## 2020-12-18 LAB — LIPID PANEL
Cholesterol: 180 (ref 0–200)
HDL: 53 (ref 35–70)
LDL Cholesterol: 101
Triglycerides: 151 (ref 40–160)

## 2020-12-18 LAB — HEMOGLOBIN A1C: Hemoglobin A1C: 6.2

## 2020-12-18 LAB — TSH: TSH: 0.7 (ref 0.41–5.90)

## 2020-12-19 DIAGNOSIS — M170 Bilateral primary osteoarthritis of knee: Secondary | ICD-10-CM | POA: Diagnosis not present

## 2020-12-19 DIAGNOSIS — Z6834 Body mass index (BMI) 34.0-34.9, adult: Secondary | ICD-10-CM | POA: Diagnosis not present

## 2020-12-25 ENCOUNTER — Ambulatory Visit (INDEPENDENT_AMBULATORY_CARE_PROVIDER_SITE_OTHER): Payer: Medicare HMO

## 2020-12-25 ENCOUNTER — Ambulatory Visit: Payer: Medicare HMO | Admitting: Podiatry

## 2020-12-25 ENCOUNTER — Other Ambulatory Visit: Payer: Self-pay

## 2020-12-25 ENCOUNTER — Encounter: Payer: Self-pay | Admitting: Podiatry

## 2020-12-25 DIAGNOSIS — L97512 Non-pressure chronic ulcer of other part of right foot with fat layer exposed: Secondary | ICD-10-CM

## 2020-12-25 DIAGNOSIS — B351 Tinea unguium: Secondary | ICD-10-CM

## 2020-12-25 DIAGNOSIS — Z89412 Acquired absence of left great toe: Secondary | ICD-10-CM

## 2020-12-25 DIAGNOSIS — E11621 Type 2 diabetes mellitus with foot ulcer: Secondary | ICD-10-CM

## 2020-12-25 DIAGNOSIS — E1142 Type 2 diabetes mellitus with diabetic polyneuropathy: Secondary | ICD-10-CM | POA: Diagnosis not present

## 2020-12-25 DIAGNOSIS — L84 Corns and callosities: Secondary | ICD-10-CM

## 2020-12-25 MED ORDER — MUPIROCIN 2 % EX OINT: TOPICAL_OINTMENT | CUTANEOUS | 0 refills | Status: DC

## 2020-12-25 NOTE — Patient Instructions (Signed)
DRESSING CHANGES R 2nd toe:   PHARMACY SHOPPING LIST: 1. Saline or Wound Cleanser for cleaning wound 2. 2 x 2 inch sterile gauze for cleaning wound 3.   A. IF DISPENSED, WEAR SURGICAL SHOE OR WALKING BOOT AT ALL TIMES.  B. IF PRESCRIBED ORAL ANTIBIOTICS, TAKE ALL MEDICATION AS PRESCRIBED UNTIL ALL ARE GONE.  C. IF DOCTOR HAS DESIGNATED NONWEIGHTBEARING STATUS, PLEASE ADHERE TO INSTRUCTIONS.   1. KEEP right foot DRY AT ALL TIMES!!!!  2. CLEANSE ULCER WITH SALINE OR WOUND CLEANSER.  3. DAB DRY WITH GAUZE SPONGE.  4. APPLY A LIGHT AMOUNT OF Mupirocin Ointment TO BASE OF ULCER.  5. APPLY OUTER DRESSING AS INSTRUCTED.  6. WEAR SURGICAL SHOE/BOOT DAILY AT ALL TIMES. IF SUPPLIED, WEAR HEEL PROTECTORS AT ALL TIMES WHEN IN BED.  7. DO NOT WALK BAREFOOT!!!  8.  IF YOU EXPERIENCE ANY FEVER, CHILLS, NIGHTSWEATS, NAUSEA OR VOMITING, ELEVATED OR LOW BLOOD SUGARS, REPORT TO EMERGENCY ROOM.  9. IF YOU EXPERIENCE INCREASED REDNESS, PAIN, SWELLING, DISCOLORATION, ODOR, PUS, DRAINAGE OR WARMTH OF YOUR FOOT, REPORT TO EMERGENCY ROOM.

## 2020-12-26 NOTE — Progress Notes (Signed)
Subjective: Calvin Shields presents today for at risk foot care. Patient has h/o amputation of partial amputation of R hallux.  Risk factors: NIDDM with neuropathy (gabapentin, metformin, Victoza), hyperlipidemia (pravastatin), history of ulceration left hallux with subsequent partial hallux amputation left hallux in 2018.  Endocrinologist is Dr. Maryella Shivers. PCP is Dr. Donzetta Sprung.   He states he wore [employer]y shoes to church about 5-6 weeks ago and is concerned about appearance of right 2nd digit.  Past Medical History:  Diagnosis Date  . Diabetes mellitus without complication (HCC)   . Hyperlipidemia   . Hypertension     Patient Active Problem List   Diagnosis Date Noted  . Primary osteoarthritis of knees, bilateral 05/13/2018  . Osteomyelitis (HCC) 09/03/2017  . Diabetic ulcer of toe of left foot associated with type 2 diabetes mellitus, with fat layer exposed (HCC) 08/13/2017  . Onychomycosis 05/24/2017  . Morbid obesity due to excess calories (HCC) 04/23/2016  . Type II diabetes mellitus, uncontrolled (HCC) 01/16/2016  . Mixed hyperlipidemia 01/16/2016  . Essential hypertension, benign 01/16/2016  . Blood loss anemia 01/29/2014  . Diverticular disease 01/29/2014  . Nonalcoholic fatty liver disease 01/29/2014  . PNA (pneumonia) 01/26/2014  . DM (diabetes mellitus), type 2, uncontrolled w/neurologic complication (HCC) 01/25/2014  . HTN (hypertension) 01/21/2014  . Posterior tibial tendinitis, unspecified leg 01/21/2014  . Status post ankle fusion 01/21/2014  . TENDINITIS, LEFT KNEE 09/12/2009  . Arthritis 06/14/2009  . Knee pain 06/14/2009    Past Surgical History:  Procedure Laterality Date  . AMPUTATION Left 09/08/2017   Procedure: PARTIAL TOE AMPUTATION LEFT HALLUX;  Surgeon: Erskine Emery, DPM;  Location: [facility];  Service: Podiatry;  Laterality: Left;  . right leg surgery       Current Outpatient Medications on File Prior to Visit  Medication Sig  Dispense Refill  . ACCU-CHEK FASTCLIX LANCETS MISC Use to test blood glucose 2 times a day. 5 each 2  . acetaminophen-codeine (TYLENOL #3) 300-30 MG tablet     . allopurinol (ZYLOPRIM) 100 MG tablet Take 100 mg by mouth daily.    Marland Kitchen amoxicillin (AMOXIL) 500 MG capsule     . aspirin EC 81 MG tablet Take 81 mg by mouth daily.    . cephALEXin (KEFLEX) 500 MG capsule Take 1 capsule (500 mg total) by mouth 3 (three) times daily. 45 capsule 1  . cephALEXin (KEFLEX) 500 MG capsule Take 1 capsule (500 mg total) by mouth 3 (three) times daily. 30 capsule 1  . ciclopirox (PENLAC) 8 % solution     . CONTOUR NEXT TEST test strip USE TO TEST BLOOD SUGAR TWICE DAILY AS DIRECTED. 100 each 5  . gabapentin (NEURONTIN) 300 MG capsule     . glipiZIDE (GLUCOTROL XL) 5 MG 24 hr tablet Take 1 tablet (5 mg total) by mouth daily with breakfast. 90 tablet 1  . GLOBAL EASE INJECT PEN NEEDLES 31G X 8 MM MISC USE AS DIRECTED. 60 each 0  . glucose blood (ACCU-CHEK GUIDE) test strip Use as instructed bid 100 each 5  . HYDROcodone-acetaminophen (NORCO/VICODIN) 5-325 MG tablet     . ibuprofen (ADVIL,MOTRIN) 800 MG tablet Take 800 mg by mouth every 8 (eight) hours as needed.    Marland Kitchen lisinopril-hydrochlorothiazide (PRINZIDE,ZESTORETIC) 20-25 MG per tablet Take 1 tablet by mouth daily.    . metFORMIN (GLUCOPHAGE) 500 MG tablet Take 1 tablet (500 mg total) by mouth daily with breakfast. 90 tablet 1  . metFORMIN (GLUCOPHAGE-XR) 500 MG 24 hr  tablet     . Multiple Vitamin (MULTIVITAMIN WITH MINERALS) TABS tablet Take 1 tablet by mouth daily.    . pantoprazole (PROTONIX) 40 MG tablet     . pravastatin (PRAVACHOL) 40 MG tablet TAKE 1 TABLET BY MOUTH ONCE DAILY. 90 tablet 1  . tamsulosin (FLOMAX) 0.4 MG CAPS capsule     . terbinafine (LAMISIL) 250 MG tablet     . traMADol (ULTRAM) 50 MG tablet     . VICTOZA 18 MG/3ML SOPN      No current facility-administered medications on file prior to visit.     Allergies  Allergen Reactions   . Codeine Nausea And Vomiting  . Aspirin Other (See Comments)    Other reaction(s): Abdominal Pain, Other (See Comments)   . Ibuprofen Other (See Comments)    Other reaction(s): Abdominal Pain, Other (See Comments)     Social History   Occupational History  . Not on file  Tobacco Use  . Smoking status: Never Smoker  . Smokeless tobacco: Never Used  Vaping Use  . Vaping Use: Never used  Substance and Sexual Activity  . Alcohol use: No  . Drug use: No  . Sexual activity: Not on file    History reviewed. No pertinent family history.  Immunization History  Administered Date(s) Administered  . Influenza,inj,Quad PF,6+ Mos 10/13/2017, 10/06/2019     Objective: There were no vitals filed for this visit.  Calvin Shields is a pleasant 64 y.o. African American male, morbidly obese,  in NAD. AAO X 3.  Vascular Examination: Capillary fill time to digits <3 seconds b/l lower extremities. Palpable DP pulses b/l. Faintly palpable PT pulses b/l. Pedal hair sparse b/l. Skin temperature gradient within normal limits b/l. No pain with calf compression b/l. Trace edema noted b/l feet.  Dermatological Examination: Pedal skin with normal turgor, texture and tone bilaterally. No open wounds bilaterally. No interdigital macerations bilaterally. Toenails 1-5 right, L 2nd toe, L 3rd toe, L 4th toe and L 5th toe elongated, discolored, dystrophic, thickened, and crumbly with subungual debris and tenderness to dorsal palpation. Hyperkeratotic lesion(s) L 2nd toe, L 5th toe. Porokeratotic lesion plantar aspect right heel.  No erythema, no edema, no drainage, no flocculence.   Wound Location: R 2nd toe dorsal DIPJ There is a minimal amount of devitalized tissue present in the wound. Predebridement Wound Measurement:  0.5  x 0.5 cm. Postdebridement Wound Measurement: 0.5 x 0.5 x 0.2 cm. Wound Base: Mixed Granular/Fibrotic Peri-wound: Normal, Calloused Exudate: Scant/small amount None Blood Loss  during debridement: 0 cc('s). Material in wound which inhibits healing/promotes adjacent tissue breakdown:  nonviable hyperkeratosis. Description of tissue removed from ulceration today:  nonviable hyperkeratosis. Sign(s) of clinical bacterial infection: no clinical signs of infection noted on examination today.   Preulcerative hyperkeratotic lesions distal tip right hallux and distal tip left 2nd digit. No erythema, no edema, no drainage, no fluctuance, no warmth, no ischemia, no gangrene.  Musculoskeletal Examination: Normal muscle strength 5/5 to all lower extremity muscle groups bilaterally. No pain crepitus or joint limitation noted with ROM b/l. Hammertoes noted to the 2-5 bilaterally. Lower extremity amputation(s): partial amputation of L hallux.  Neurological Examination: Protective sensation diminished with 10g monofilament b/l. Vibratory sensation decreased b/l. Proprioception intact bilaterally.   Xray findings right foot, 3 views: Evidence of ankle fusion; hardware remains intact. Evidence of Charcot right ankle joint Pes planus RLE Plantar spur right calcaneus. No gas in tissues right foot. No foreign body evident right foot. No bone erosion  noted at location of ulceration R 2nd toe.  Hemoglobin A1C 08/14/2020 04/13/2020  HGBA1C 6.4 6.2  Some recent data might be hidden   Assessment: 1. Onychomycosis   2. Pre-ulcerative corn or callous   3. Diabetic ulcer of toe of right foot associated with type 2 diabetes mellitus, with fat layer exposed (HCC)   4. Status post amputation of great toe, left (HCC)   5. Diabetic peripheral neuropathy associated with type 2 diabetes mellitus (HCC)     Plan: -Examined patient.  1. Ulcer was debrided and reactive hyperkeratoses and necrotic tissue was resected to the level of bleeding or viable tissue. Ulcer was cleansed with wound cleanser. Iodosorb Gel was applied to base of wound with light dressing. 2. Xray was  performed right foot. No  evidence of osteomyelitis found on today's xray. 3. He has surgical shoe at home and was instructed to start wearing it today until he sees Dr. Lilian Kapur on next week. 4. Patient was given instructions on offloading and dressing change/aftercare and was instructed to call immediately if any signs or symptoms of infection arise.  Prescription written for Mupirocin Ointment. Patient is to apply to right 2nd digit once daily and cover with dressing.  5. Patient instructed to report to emergency department with worsening appearance of ulcer/toe/foot, increased pain, foul odor, increased redness, swelling, drainage, fever, chills, nightsweats, nausea, vomiting, increased blood sugar. Patient/POA related understanding. 6. Preulcerative corn left 2nd digit and right hallux pared without incident. # of lesions=2 7.  Toenails 1-5 right, L 2nd toe, L 3rd toe, L 4th toe and L 5th toe debrided in length and girth without complication. Corn(s) L 5th toe and  porokeratotic lesion plantar aspect of right heel  were pared utilizing sterile scalpel blade without incident. Total number debrided =2. 8. Patient to report any pedal injuries to medical professional immediately. -9. Patient/POA to call should there be question/concern in the interim.  Return in about 1 week (around 01/01/2021) for with Dr. Lilian Kapur for right 2nd toe diabetic ulcer.

## 2020-12-28 ENCOUNTER — Ambulatory Visit: Payer: Medicare HMO | Admitting: Podiatry

## 2020-12-28 ENCOUNTER — Other Ambulatory Visit: Payer: Self-pay

## 2020-12-28 DIAGNOSIS — Z89412 Acquired absence of left great toe: Secondary | ICD-10-CM

## 2020-12-28 DIAGNOSIS — E11621 Type 2 diabetes mellitus with foot ulcer: Secondary | ICD-10-CM

## 2020-12-28 DIAGNOSIS — M2042 Other hammer toe(s) (acquired), left foot: Secondary | ICD-10-CM

## 2020-12-28 DIAGNOSIS — M2041 Other hammer toe(s) (acquired), right foot: Secondary | ICD-10-CM | POA: Diagnosis not present

## 2020-12-28 DIAGNOSIS — E1142 Type 2 diabetes mellitus with diabetic polyneuropathy: Secondary | ICD-10-CM | POA: Diagnosis not present

## 2020-12-28 DIAGNOSIS — L97512 Non-pressure chronic ulcer of other part of right foot with fat layer exposed: Secondary | ICD-10-CM

## 2020-12-28 NOTE — Patient Instructions (Signed)
Wear the silicone pads and continue using the mupirocin ointment daily

## 2020-12-30 ENCOUNTER — Encounter: Payer: Self-pay | Admitting: Podiatry

## 2020-12-30 NOTE — Progress Notes (Signed)
  Subjective:  Patient ID: Calvin Shields, male    DOB: Dec 04, 1956,  MRN: [id number]  Chief Complaint  Patient presents with  . Foot Pain    Bilateral open areas to the toes from wearing dress shoes.    65 y.o. male presents with the above complaint. History confirmed with patient.  Referred to me by Dr. Eloy End for evaluation of ulcers on the left second toe tip and DIPJ of the right second toe.  Has been dressing daily with mupirocin ointment.  Objective:  Physical Exam: warm, good capillary refill, no trophic changes or ulcerative lesions and normal DP and PT pulses.  Absent protective sensation.  On the left foot there is a healed preulcerative callus on the tip of the second toe.  The hammertoes of 2 through 5 are quite rigid.  He has a previous partial hallux amputation on the left foot.  On the right foot there is a full-thickness ulceration measuring 3 mm in diameter which probes to subcutaneous tissue over the dorsal DIPJ of the second toe.  Preulcerative callus on the hallux.  No signs infection or cellulitis.  Rigid hammertoes.   Radiographs: Previous radiographs reviewed, no evidence of osteomyelitis Assessment:   1. Diabetic ulcer of toe of right foot associated with type 2 diabetes mellitus, with fat layer exposed (HCC)   2. Status post amputation of great toe, left (HCC)   3. Diabetic peripheral neuropathy associated with type 2 diabetes mellitus (HCC)      Plan:  Patient was evaluated and treated and all questions answered.   Ulcer right second toe -Debridement as below. -Dressed with Iodosorb, DSD. -Continue off-loading with surgical shoe. -Okay to bathe if he changes dressing immediately -Apply mupirocin daily -Most contribute factors are diabetic peripheral neuropathy and his rigid hammertoe deformities.  If we get his ulcers to heal could consider minimally invasive hammertoe correction to straighten of the toes.  I do not think soft tissue work will not will  be able to fix this.  Procedure: Excisional Debridement of Wound Rationale: Removal of non-viable soft tissue from the wound to promote healing.  Anesthesia: none Pre-Debridement Wound Measurements: 0.3 cm x 0.3 cm x 0.2 cm  Post-Debridement Wound Measurements: Same as predebridement Type of Debridement: Sharp selective Tissue Removed: Non-viable soft tissue Depth of Debridement: subcutaneous tissue. Technique: Sharp selective debridement to bleeding, viable wound base.  Dressing: Dry, sterile, compression dressing. Disposition: Patient tolerated procedure well.    Return in about 3 weeks (around 01/18/2021) for wound re-check.

## 2021-01-01 ENCOUNTER — Ambulatory Visit: Payer: Medicare HMO | Admitting: Podiatry

## 2021-01-01 DIAGNOSIS — G6289 Other specified polyneuropathies: Secondary | ICD-10-CM | POA: Diagnosis not present

## 2021-01-01 DIAGNOSIS — R4582 Worries: Secondary | ICD-10-CM | POA: Diagnosis not present

## 2021-01-01 DIAGNOSIS — M1712 Unilateral primary osteoarthritis, left knee: Secondary | ICD-10-CM | POA: Diagnosis not present

## 2021-01-01 DIAGNOSIS — N400 Enlarged prostate without lower urinary tract symptoms: Secondary | ICD-10-CM | POA: Diagnosis not present

## 2021-01-01 DIAGNOSIS — I10 Essential (primary) hypertension: Secondary | ICD-10-CM | POA: Diagnosis not present

## 2021-01-01 DIAGNOSIS — E1142 Type 2 diabetes mellitus with diabetic polyneuropathy: Secondary | ICD-10-CM | POA: Diagnosis not present

## 2021-01-01 DIAGNOSIS — E782 Mixed hyperlipidemia: Secondary | ICD-10-CM | POA: Diagnosis not present

## 2021-01-15 ENCOUNTER — Ambulatory Visit: Payer: Medicare HMO | Admitting: Podiatry

## 2021-01-23 ENCOUNTER — Telehealth: Payer: Self-pay | Admitting: Podiatry

## 2021-01-23 NOTE — Telephone Encounter (Signed)
Patient called inquiring about meds that was prescribed back in December 2021, patient stated pharmacy has sent over prior authorization and asked if we could send that paperwork back, stated he needs it.  Patient request I send information to both providers. Please Advise

## 2021-01-23 NOTE — Telephone Encounter (Signed)
It doesn't look like I've sent him anything and I haven't seen any prior [facility] for him either

## 2021-01-23 NOTE — Telephone Encounter (Signed)
I see an request for refill of Mupirocin ointment in my box. I've signed and faxed back to [facility].

## 2021-01-23 NOTE — Telephone Encounter (Signed)
Called patient, unable to LVM but will try again to rely message. Thanks

## 2021-01-29 ENCOUNTER — Ambulatory Visit: Payer: Medicare HMO | Admitting: Podiatry

## 2021-01-29 ENCOUNTER — Other Ambulatory Visit: Payer: Self-pay

## 2021-01-29 ENCOUNTER — Encounter: Payer: Self-pay | Admitting: Podiatry

## 2021-01-29 DIAGNOSIS — M2041 Other hammer toe(s) (acquired), right foot: Secondary | ICD-10-CM

## 2021-01-29 DIAGNOSIS — M2042 Other hammer toe(s) (acquired), left foot: Secondary | ICD-10-CM | POA: Diagnosis not present

## 2021-01-29 DIAGNOSIS — E1142 Type 2 diabetes mellitus with diabetic polyneuropathy: Secondary | ICD-10-CM | POA: Diagnosis not present

## 2021-01-29 DIAGNOSIS — E11621 Type 2 diabetes mellitus with foot ulcer: Secondary | ICD-10-CM | POA: Diagnosis not present

## 2021-01-29 DIAGNOSIS — L97512 Non-pressure chronic ulcer of other part of right foot with fat layer exposed: Secondary | ICD-10-CM

## 2021-01-29 DIAGNOSIS — Z89412 Acquired absence of left great toe: Secondary | ICD-10-CM

## 2021-01-29 NOTE — Progress Notes (Signed)
  Subjective:  Patient ID: Calvin Shields, male    DOB: Jun 18, 1956,  MRN: [id number]  Chief Complaint  Patient presents with  . Wound Check    Wound check. PT stated that he is doing okay he does have some pain on the 4th toe of the left foot due to the nail. PT stated he would like his nails trimmed     65 y.o. male returns with the above complaint. History confirmed with patient.  Still using mupirocin, thinks it has gotten better.  Objective:  Physical Exam: warm, good capillary refill, no trophic changes or ulcerative lesions and normal DP and PT pulses.  Absent protective sensation.  On the left foot there is a healed preulcerative callus on the tip of the second toe.  The hammertoes of 2 through 5 are quite rigid.  He has a previous partial hallux amputation on the left foot.  Right 2nd toe ulcer healed. Preulcerative callus on the hallux.  No signs infection or cellulitis.  Rigid hammertoes.   Radiographs: Previous radiographs reviewed, no evidence of osteomyelitis Assessment:   1. Diabetic ulcer of toe of right foot associated with type 2 diabetes mellitus, with fat layer exposed (HCC)   2. Status post amputation of great toe, left (HCC)   3. Diabetic peripheral neuropathy associated with type 2 diabetes mellitus (HCC)   4. Hammertoe of right foot   5. Hammertoe of left foot      Plan:  Patient was evaluated and treated and all questions answered.   Ulcer right second toe -Healed -Apply mupirocin daily to R hallux -Most contribute factors are diabetic peripheral neuropathy and his rigid hammertoe deformities.  If we get his ulcers to heal could consider minimally invasive hammertoe correction to straighten of the toes.  I do not think soft tissue work will not will be able to fix this.  Hammertoes -Consider percutaneous correction with osteotomies, pinning, and permanent R hallux nail avulsion. Discussed risks benefits and possible complications. He will consider this  and may plan for May/June. He should return to schedule and consent in March    Return in about 6 weeks (around 03/12/2021) for plan surgery on toes.

## 2021-02-21 ENCOUNTER — Other Ambulatory Visit: Payer: Self-pay | Admitting: "Endocrinology

## 2021-03-12 ENCOUNTER — Encounter: Payer: Self-pay | Admitting: "Endocrinology

## 2021-03-12 ENCOUNTER — Ambulatory Visit (INDEPENDENT_AMBULATORY_CARE_PROVIDER_SITE_OTHER): Payer: Medicare HMO | Admitting: "Endocrinology

## 2021-03-12 ENCOUNTER — Other Ambulatory Visit: Payer: Self-pay

## 2021-03-12 VITALS — BP 113/59 | HR 82 | Ht 67.5 in | Wt 250.2 lb

## 2021-03-12 DIAGNOSIS — I10 Essential (primary) hypertension: Secondary | ICD-10-CM

## 2021-03-12 DIAGNOSIS — E1165 Type 2 diabetes mellitus with hyperglycemia: Secondary | ICD-10-CM | POA: Diagnosis not present

## 2021-03-12 DIAGNOSIS — E782 Mixed hyperlipidemia: Secondary | ICD-10-CM

## 2021-03-12 NOTE — Progress Notes (Signed)
03/12/2021   Endocrinology follow-up note   Subjective:    Patient ID: Calvin Shields, male    DOB: 11-09-1956, PCP Richardean Chimera, MD   Past Medical History:  Diagnosis Date  . Diabetes mellitus without complication (HCC)   . Hyperlipidemia   . Hypertension    Past Surgical History:  Procedure Laterality Date  . AMPUTATION Left 09/08/2017   Procedure: PARTIAL TOE AMPUTATION LEFT HALLUX;  Surgeon: Erskine Emery, DPM;  Location: [facility];  Service: Podiatry;  Laterality: Left;  . right leg surgery       History reviewed. No pertinent family history.  Social History   Socioeconomic History  . Marital status: Married    Spouse name: Not on file  . Number of children: Not on file  . Years of education: Not on file  . Highest education level: Not on file  Occupational History  . Not on file  Tobacco Use  . Smoking status: Never Smoker  . Smokeless tobacco: Never Used  Vaping Use  . Vaping Use: Never used  Substance and Sexual Activity  . Alcohol use: No  . Drug use: No  . Sexual activity: Not on file  Other Topics Concern  . Not on file  Social History Narrative  . Not on file   Social Determinants of Health   Financial Resource Strain: Not on file  Food Insecurity: Not on file  Transportation Needs: Not on file  Physical Activity: Not on file  Stress: Not on file  Social Connections: Not on file   Outpatient Encounter Medications as of 03/12/2021  Medication Sig  . ACCU-CHEK FASTCLIX LANCETS MISC Use to test blood glucose 2 times a day.  Marland Kitchen acetaminophen-codeine (TYLENOL #3) 300-30 MG tablet   . allopurinol (ZYLOPRIM) 100 MG tablet Take 100 mg by mouth daily.  Marland Kitchen amoxicillin (AMOXIL) 500 MG capsule   . aspirin EC 81 MG tablet Take 81 mg by mouth daily.  . cephALEXin (KEFLEX) 500 MG capsule Take 1 capsule (500 mg total) by mouth 3 (three) times daily.  . cephALEXin (KEFLEX) 500 MG capsule Take 1 capsule (500 mg total) by mouth 3 (three) times daily.   . ciclopirox (PENLAC) 8 % solution   . CONTOUR NEXT TEST test strip USE TO TEST BLOOD SUGAR TWICE DAILY AS DIRECTED.  Marland Kitchen gabapentin (NEURONTIN) 300 MG capsule   . GLOBAL EASE INJECT PEN NEEDLES 31G X 8 MM MISC USE AS DIRECTED.  Marland Kitchen glucose blood (ACCU-CHEK GUIDE) test strip Use as instructed bid  . HYDROcodone-acetaminophen (NORCO/VICODIN) 5-325 MG tablet   . ibuprofen (ADVIL,MOTRIN) 800 MG tablet Take 800 mg by mouth every 8 (eight) hours as needed.  Marland Kitchen lisinopril-hydrochlorothiazide (PRINZIDE,ZESTORETIC) 20-25 MG per tablet Take 1 tablet by mouth daily.  . metFORMIN (GLUCOPHAGE-XR) 500 MG 24 hr tablet   . Multiple Vitamin (MULTIVITAMIN WITH MINERALS) TABS tablet Take 1 tablet by mouth daily.  . mupirocin ointment (BACTROBAN) 2 % Apply to right 2nd toe once daily.  . pantoprazole (PROTONIX) 40 MG tablet   . pravastatin (PRAVACHOL) 40 MG tablet TAKE 1 TABLET BY MOUTH ONCE DAILY.  . tamsulosin (FLOMAX) 0.4 MG CAPS capsule   . terbinafine (LAMISIL) 250 MG tablet   . traMADol (ULTRAM) 50 MG tablet   . VICTOZA 18 MG/3ML SOPN Inject 1.8 mg into the skin daily before breakfast.  . [DISCONTINUED] glipiZIDE (GLUCOTROL XL) 5 MG 24 hr tablet TAKE 1 TABLET ONCE DAILY WITH BREAKFAST.  . [DISCONTINUED] metFORMIN (GLUCOPHAGE) 500 MG tablet Take  1 tablet (500 mg total) by mouth daily with breakfast.   No facility-administered encounter medications on file as of 03/12/2021.   ALLERGIES: Allergies  Allergen Reactions  . Codeine Nausea And Vomiting  . Aspirin Other (See Comments)    Other reaction(s): Abdominal Pain, Other (See Comments)   . Ibuprofen Other (See Comments)    Other reaction(s): Abdominal Pain, Other (See Comments)    VACCINATION STATUS: Immunization History  Administered Date(s) Administered  . Influenza,inj,Quad PF,6+ Mos 10/13/2017, 10/06/2019    Diabetes He presents for his follow-up diabetic visit. He has type 2 diabetes mellitus. His disease course has been improving. There  are no hypoglycemic associated symptoms. Pertinent negatives for hypoglycemia include no confusion, headaches, pallor or seizures. Pertinent negatives for diabetes include no chest pain, no fatigue, no polydipsia, no polyphagia, no polyuria and no weakness. There are no hypoglycemic complications. Symptoms are improving. Diabetic complications include peripheral neuropathy. Risk factors for coronary artery disease include diabetes mellitus, dyslipidemia, hypertension, male sex, obesity and sedentary lifestyle. He is compliant with treatment most of the time. His weight is increasing steadily. He is following a generally unhealthy diet. When asked about meal planning, he reported none. He has had a previous visit with a dietitian. He never participates in exercise. His home blood glucose trend is fluctuating minimally. (Patient reports consistent glycemic profile to near target levels, no hypoglycemia.  His previsit labs show A1c of 6.2%.   ) An ACE inhibitor/angiotensin II receptor blocker is being taken. Eye exam is current.  Hyperlipidemia This is a chronic problem. The current episode started more than 1 year ago. The problem is uncontrolled. Exacerbating diseases include diabetes and obesity. Pertinent negatives include no chest pain, myalgias or shortness of breath. Current antihyperlipidemic treatment includes statins. Compliance problems include adherence to diet.  Risk factors for coronary artery disease include diabetes mellitus, dyslipidemia, hypertension, male sex, obesity and a sedentary lifestyle.  Hypertension This is a chronic problem. The current episode started more than 1 year ago. Pertinent negatives include no chest pain, headaches, neck pain, palpitations or shortness of breath. Risk factors for coronary artery disease include dyslipidemia, diabetes mellitus, obesity, male gender and sedentary lifestyle. Past treatments include ACE inhibitors.     Review of systems Limited as  above.   Objective:    BP (!) 113/59   Pulse 82   Ht 5' 7.5" (1.715 m)   Wt 250 lb 3.2 oz (113.5 kg)   BMI 38.61 kg/m   Wt Readings from Last 3 Encounters:  03/12/21 250 lb 3.2 oz (113.5 kg)  09/11/20 235 lb 3.2 oz (106.7 kg)  05/01/20 229 lb 9.6 oz (104.1 kg)        Recent Results (from the past 2160 hour(s))  Basic metabolic panel     Status: None   Collection Time: 12/18/20 12:00 AM  Result Value Ref Range   BUN 17 4 - 21   Creatinine 1.2 0.6 - 1.3  Comprehensive metabolic panel     Status: None   Collection Time: 12/18/20 12:00 AM  Result Value Ref Range   GFR calc Af Amer 76    GFR calc non Af Amer 66    Calcium 10.0 8.7 - 10.7  Lipid panel     Status: None   Collection Time: 12/18/20 12:00 AM  Result Value Ref Range   Triglycerides 151 40 - 160   Cholesterol 180 0 - 200   HDL 53 35 - 70   LDL Cholesterol 101  Hemoglobin A1c     Status: None   Collection Time: 12/18/20 12:00 AM  Result Value Ref Range   Hemoglobin A1C 6.2   TSH     Status: None   Collection Time: 12/18/20 12:00 AM  Result Value Ref Range   TSH 0.70 0.41 - 5.90   Lipid Panel     Component Value Date/Time   CHOL 180 12/18/2020 0000   TRIG 151 12/18/2020 0000   HDL 53 12/18/2020 0000   CHOLHDL 4.1 10/06/2018 1310   VLDL 29 04/15/2016 0836   LDLCALC 101 12/18/2020 0000   LDLCALC 114 (H) 10/06/2018 1310    Assessment & Plan:   1. Diabetes mellitus without complication (HCC)  - His diabetes is  complicated by peripheral neuropathy , peripheral arterial disease, Charcot's deformities and patient remains at a high risk for more acute and chronic complications of diabetes which include CAD, CVA, CKD, retinopathy, and neuropathy. These are all discussed in detail with the patient.  Patient reports consistent glycemic profile to near target levels, no hypoglycemia.  His previsit labs show A1c of 6.2%.   - Recent labs reviewed with him, showing normal renal function.  - I have  re-counseled the patient on diet management and  and weight loss by adopting a carbohydrate restricted / protein rich  Diet.  -He has gained weight, admits to dietary indiscretion.   - he acknowledges that there is a room for improvement in his food and drink choices. - Suggestion is made for him to avoid simple carbohydrates  from his diet including Cakes, Sweet Desserts, Ice Cream, Soda (diet and regular), Sweet Tea, Candies, Chips, Cookies, Store Bought Juices, Alcohol in Excess of  1-2 drinks a day, Artificial Sweeteners,  Coffee Creamer, and "Sugar-free" Products, Lemonade. This will help patient to have more stable blood glucose profile and potentially avoid unintended weight gain.  - Patient is advised to stick to a routine mealtimes to eat 3 meals  a day and avoid unnecessary snacks ( to snack only to correct hypoglycemia).  - I have approached patient with the following individualized plan to manage diabetes and patient agrees.  -Given his presentation with stable A1c of 6.2%, he will not need insulin treatment at this time.  He found a way of coverage for Victoza.  He is advised to continue Victoza 1.8 mg subcutaneously daily, Metformin 500 mg once a day with breakfast.  He is advised to discontinue glipizide at this time.   - He agrees to monitor blood glucose at least twice a day-daily before breakfast and at bedtime.    - Patient specific target  for A1c; LDL, HDL, Triglycerides, and  Waist Circumference were discussed in detail.  2) BP/HTN:  -His blood pressure is controlled to target.   He is advised to continue his current blood pressure medications including lisinopril-hydrochlorthiazide 20-25 mg p.o. daily.    3) Lipids/HPL: His recent lipid panel revealed uncontrolled LDL at 114.  He is advised to continue pravastatin 40 mg p.o. nightly.  Side effects and precautions discussed with him.   4)  Weight/Diet: His BMI is 34.24-a candidate for modest weight loss.  CDE consult in  progress, exercise, and carbohydrates information provided.  5) Chronic Care/Health Maintenance:  -Patient is on ACEI/ARB and Statin medications and encouraged to continue to follow up with Ophthalmology, Podiatrist at least yearly or according to recommendations, and advised to  stay away from smoking. I have recommended yearly flu vaccine and pneumonia vaccination at least every  5 years; moderate intensity exercise for up to 150 minutes weekly; and  sleep for at least 7 hours a day.  POCT ABI Results 03/12/21  Screening ABI is normal today March 12, 2021. Right ABI: 1.04      left ABI: 1.04  Right leg systolic / diastolic: 117/63 mmHg Left leg systolic / diastolic: 118/65 mmHg  Arm systolic / diastolic: 113/59 mmHG -This study will be repeated in March 2027, or sooner if needed.  - I advised patient to maintain close follow up with Richardean Chimera, MD for primary care needs.     - Time spent on this patient care encounter:  40 min, of which > 50% was spent in  counseling and the rest reviewing his blood glucose logs , discussing his hypoglycemia and hyperglycemia episodes, reviewing his current and  previous labs / studies  ( including abstraction from other facilities) and medications  doses and developing a  long term treatment plan and documenting his care.   Please refer to Patient Instructions for Blood Glucose Monitoring and Insulin/Medications Dosing Guide"  in media tab for additional information. Please  also refer to " Patient Self Inventory" in the Media  tab for reviewed elements of pertinent patient history.  [facility] participated in the discussions, expressed understanding, and voiced agreement with the above plans.  All questions were answered to his satisfaction. he is encouraged to contact clinic should he have any questions or concerns prior to his return visit.  Follow up plan: -Return in about 3 months (around 06/12/2021) for A1c -[place].  Marquis Lunch, MD Phone:  [phone]  Fax: [phone]  -  This note was partially dictated with voice recognition software. Similar sounding words can be transcribed inadequately or may not  be corrected upon review.  03/12/2021, 4:03 PM

## 2021-03-12 NOTE — Patient Instructions (Signed)

## 2021-03-19 ENCOUNTER — Encounter: Payer: Self-pay | Admitting: Podiatry

## 2021-03-19 ENCOUNTER — Ambulatory Visit: Payer: Medicare HMO | Admitting: Podiatry

## 2021-03-19 ENCOUNTER — Other Ambulatory Visit: Payer: Self-pay

## 2021-03-19 DIAGNOSIS — B351 Tinea unguium: Secondary | ICD-10-CM

## 2021-03-19 DIAGNOSIS — L84 Corns and callosities: Secondary | ICD-10-CM

## 2021-03-19 DIAGNOSIS — Q828 Other specified congenital malformations of skin: Secondary | ICD-10-CM | POA: Diagnosis not present

## 2021-03-19 DIAGNOSIS — E1142 Type 2 diabetes mellitus with diabetic polyneuropathy: Secondary | ICD-10-CM

## 2021-03-19 DIAGNOSIS — M79675 Pain in left toe(s): Secondary | ICD-10-CM | POA: Diagnosis not present

## 2021-03-19 DIAGNOSIS — Z89412 Acquired absence of left great toe: Secondary | ICD-10-CM | POA: Diagnosis not present

## 2021-03-19 DIAGNOSIS — M79674 Pain in right toe(s): Secondary | ICD-10-CM | POA: Diagnosis not present

## 2021-03-24 NOTE — Progress Notes (Signed)
Subjective:  Patient ID: Calvin Shields, male    DOB: 12/13/1956,  MRN: [id number]  65 y.o. male presents with at risk foot care. Patient has h/o amputation of partial amputation of L hallux and callus(es) b/l and painful thick toenails that are difficult to trim. Painful toenails interfere with ambulation. Aggravating factors include wearing enclosed shoe gear. Pain is relieved with periodic professional debridement. Painful calluses are aggravated when weightbearing with and without shoegear. Pain is relieved with periodic professional debridement.  Patient's blood sugar was 98 mg/dl this morning.  PCP: Richardean Chimera, MD and last visit was: 01/01/2021.  He states he is having pain in left 5th digit between toes 4 and 5. Duration is about one week. He has attempted no treatment. Denies any redness, swelling or drainage from area. Denies any fever, chills, night sweats, nausea or vomiting.  Review of Systems: Negative except as noted in the HPI.   Allergies  Allergen Reactions  . Codeine Nausea And Vomiting  . Aspirin Other (See Comments)    Other reaction(s): Abdominal Pain, Other (See Comments)   . Ibuprofen Other (See Comments)    Other reaction(s): Abdominal Pain, Other (See Comments)     Objective:  There were no vitals filed for this visit. Constitutional Patient is a pleasant 65 y.o. African American male morbidly obese in NAD. AAO x 3.  Vascular Capillary fill time to digits <3 seconds b/l lower extremities. Palpable DP pulse(s) b/l lower extremities Faintly palpable PT pulse(s) b/l lower extremities. Pedal hair absent. Lower extremity skin temperature gradient within normal limits. No pain with calf compression b/l. No ischemia or gangrene noted b/l lower extremities. No cyanosis or clubbing noted.  Neurologic Normal speech. Protective sensation diminished with 10g monofilament b/l.  Dermatologic Pedal skin with normal turgor, texture and tone bilaterally. No open wounds  bilaterally. No interdigital macerations bilaterally. Toenails 2-5 bilaterally and R hallux elongated, discolored, dystrophic, thickened, and crumbly with subungual debris and tenderness to dorsal palpation. Hyperkeratotic lesion(s) with visible subdermal hemorrhage L 2nd toe and R hallux.  No erythema, no edema, no drainage, no fluctuance. Porokeratotic lesion(s) with tenderness to palpation medial aspect of  L 5th toe. No erythema, no edema, no drainage, no fluctuance.  Orthopedic: Normal muscle strength 5/5 to all lower extremity muscle groups bilaterally. Hammertoes noted to the 2-5 bilaterally. Lower extremity amputation(s): partial amputation of L hallux.   Hemoglobin A1C 12/18/2020 08/14/2020 04/13/2020  HGBA1C 6.2 6.4 6.2  Some recent data might be hidden   Assessment:   1. Pain due to onychomycosis of toenails of both feet   2. Pre-ulcerative corn or callous   3. Porokeratosis   4. Status post amputation of great toe, left (HCC)   5. Diabetic peripheral neuropathy associated with type 2 diabetes mellitus (HCC)    Plan:  Patient was evaluated and treated and all questions answered.  Onychomycosis with pain -Nails palliatively debridement as below. -Educated on self-care  Procedure: Nail Debridement Rationale: Pain Type of Debridement: manual, sharp debridement. Instrumentation: Nail nipper, rotary burr. Number of Nails: 9  -Examined patient. -Continue diabetic foot care principles. -Patient to continue soft, supportive shoe gear daily. -Toenails 2-5 bilaterally and R hallux debrided in length and girth without iatrogenic bleeding with sterile nail nipper and dremel.  -Callus(es) L 2nd toe and R hallux pared utilizing sterile scalpel blade without complication or incident. Total number debrided =2. -Painful porokeratotic lesion(s) L 5th toe pared and enucleated with sterile scalpel blade without incident. -Patient to report any  pedal injuries to medical professional  immediately. -Discussed development of new lesion left foot. Dispensed toe separator for left 5th digit. Apply every morning. Remove every evening. -Patient/POA to call should there be question/concern in the interim.  Return in about 9 weeks (around 05/21/2021)  with Dr. Lilian Kapur.  Freddie Breech, DPM

## 2021-03-25 ENCOUNTER — Other Ambulatory Visit: Payer: Self-pay | Admitting: "Endocrinology

## 2021-04-02 DIAGNOSIS — I10 Essential (primary) hypertension: Secondary | ICD-10-CM | POA: Diagnosis not present

## 2021-04-02 DIAGNOSIS — Z1329 Encounter for screening for other suspected endocrine disorder: Secondary | ICD-10-CM | POA: Diagnosis not present

## 2021-04-02 DIAGNOSIS — E7849 Other hyperlipidemia: Secondary | ICD-10-CM | POA: Diagnosis not present

## 2021-04-02 DIAGNOSIS — E1142 Type 2 diabetes mellitus with diabetic polyneuropathy: Secondary | ICD-10-CM | POA: Diagnosis not present

## 2021-04-02 DIAGNOSIS — R5382 Chronic fatigue, unspecified: Secondary | ICD-10-CM | POA: Diagnosis not present

## 2021-04-02 DIAGNOSIS — M109 Gout, unspecified: Secondary | ICD-10-CM | POA: Diagnosis not present

## 2021-04-02 DIAGNOSIS — E782 Mixed hyperlipidemia: Secondary | ICD-10-CM | POA: Diagnosis not present

## 2021-04-02 LAB — BASIC METABOLIC PANEL
BUN: 20 (ref 4–21)
CO2: 21 (ref 13–22)
Chloride: 95 — AB (ref 99–108)
Creatinine: 1.2 (ref 0.6–1.3)
Glucose: 60
Potassium: 3.9 (ref 3.4–5.3)
Sodium: 134 — AB (ref 137–147)

## 2021-04-02 LAB — HEPATIC FUNCTION PANEL
ALT: 15 (ref 10–40)
AST: 18 (ref 14–40)
Alkaline Phosphatase: 107 (ref 25–125)
Bilirubin, Total: 0.4

## 2021-04-02 LAB — LIPID PANEL
Cholesterol: 152 (ref 0–200)
HDL: 45 (ref 35–70)
LDL Cholesterol: 84
Triglycerides: 127 (ref 40–160)

## 2021-04-02 LAB — COMPREHENSIVE METABOLIC PANEL
Albumin: 4.3 (ref 3.5–5.0)
Calcium: 9.8 (ref 8.7–10.7)
Globulin: 2.5

## 2021-04-02 LAB — TSH: TSH: 1.42 (ref 0.41–5.90)

## 2021-04-09 DIAGNOSIS — Z7189 Other specified counseling: Secondary | ICD-10-CM | POA: Diagnosis not present

## 2021-04-09 DIAGNOSIS — F1190 Opioid use, unspecified, uncomplicated: Secondary | ICD-10-CM | POA: Diagnosis not present

## 2021-04-09 DIAGNOSIS — M1712 Unilateral primary osteoarthritis, left knee: Secondary | ICD-10-CM | POA: Diagnosis not present

## 2021-04-09 DIAGNOSIS — R4582 Worries: Secondary | ICD-10-CM | POA: Diagnosis not present

## 2021-04-09 DIAGNOSIS — Z23 Encounter for immunization: Secondary | ICD-10-CM | POA: Diagnosis not present

## 2021-04-09 DIAGNOSIS — Z1212 Encounter for screening for malignant neoplasm of rectum: Secondary | ICD-10-CM | POA: Diagnosis not present

## 2021-04-09 DIAGNOSIS — G6289 Other specified polyneuropathies: Secondary | ICD-10-CM | POA: Diagnosis not present

## 2021-04-09 DIAGNOSIS — Z9189 Other specified personal risk factors, not elsewhere classified: Secondary | ICD-10-CM | POA: Diagnosis not present

## 2021-04-09 DIAGNOSIS — E782 Mixed hyperlipidemia: Secondary | ICD-10-CM | POA: Diagnosis not present

## 2021-04-09 DIAGNOSIS — E7849 Other hyperlipidemia: Secondary | ICD-10-CM | POA: Diagnosis not present

## 2021-04-09 DIAGNOSIS — E1142 Type 2 diabetes mellitus with diabetic polyneuropathy: Secondary | ICD-10-CM | POA: Diagnosis not present

## 2021-04-09 DIAGNOSIS — N400 Enlarged prostate without lower urinary tract symptoms: Secondary | ICD-10-CM | POA: Diagnosis not present

## 2021-04-09 DIAGNOSIS — I10 Essential (primary) hypertension: Secondary | ICD-10-CM | POA: Diagnosis not present

## 2021-04-11 ENCOUNTER — Telehealth: Payer: Self-pay | Admitting: Podiatry

## 2021-04-11 NOTE — Telephone Encounter (Signed)
Pt left message returning a call to get scheduled for diabetic shoes.  I returned call and left message for pt to call to schedule an appt to be measured for diabetic shoes.

## 2021-04-16 DIAGNOSIS — Z23 Encounter for immunization: Secondary | ICD-10-CM | POA: Diagnosis not present

## 2021-04-17 ENCOUNTER — Ambulatory Visit (INDEPENDENT_AMBULATORY_CARE_PROVIDER_SITE_OTHER): Payer: Medicare HMO | Admitting: Podiatry

## 2021-04-17 ENCOUNTER — Other Ambulatory Visit: Payer: Self-pay

## 2021-04-17 DIAGNOSIS — L97512 Non-pressure chronic ulcer of other part of right foot with fat layer exposed: Secondary | ICD-10-CM

## 2021-04-17 DIAGNOSIS — Z89412 Acquired absence of left great toe: Secondary | ICD-10-CM

## 2021-04-17 DIAGNOSIS — E11621 Type 2 diabetes mellitus with foot ulcer: Secondary | ICD-10-CM

## 2021-04-17 NOTE — Progress Notes (Signed)
Patient presented for foam casting for 3 pair custom diabetic shoe inserts.  Patient was measured with a [place] device to be a size 13 medium  Diabetic shoes are chosen from the [employer] catalog.   The shoes chosen are [id number]  Patient requested not to have a filler due to the patient wanted all 3 pairs of the inserts.  Patient will be contacted when the shoes and inserts are ready for pick up.

## 2021-04-18 ENCOUNTER — Telehealth: Payer: Self-pay | Admitting: Podiatry

## 2021-04-18 NOTE — Telephone Encounter (Signed)
Left voicemail for patient to call and get scheduled to be casted for diabetic shoes.

## 2021-04-25 DIAGNOSIS — M170 Bilateral primary osteoarthritis of knee: Secondary | ICD-10-CM | POA: Diagnosis not present

## 2021-04-25 DIAGNOSIS — Z6836 Body mass index (BMI) 36.0-36.9, adult: Secondary | ICD-10-CM | POA: Diagnosis not present

## 2021-04-28 DIAGNOSIS — E782 Mixed hyperlipidemia: Secondary | ICD-10-CM | POA: Diagnosis not present

## 2021-04-28 DIAGNOSIS — I10 Essential (primary) hypertension: Secondary | ICD-10-CM | POA: Diagnosis not present

## 2021-04-28 DIAGNOSIS — E1142 Type 2 diabetes mellitus with diabetic polyneuropathy: Secondary | ICD-10-CM | POA: Diagnosis not present

## 2021-05-01 ENCOUNTER — Other Ambulatory Visit: Payer: Self-pay

## 2021-05-01 ENCOUNTER — Ambulatory Visit: Payer: Medicare HMO | Admitting: Podiatry

## 2021-05-01 ENCOUNTER — Encounter: Payer: Self-pay | Admitting: Podiatry

## 2021-05-01 DIAGNOSIS — B351 Tinea unguium: Secondary | ICD-10-CM

## 2021-05-01 DIAGNOSIS — M79675 Pain in left toe(s): Secondary | ICD-10-CM | POA: Diagnosis not present

## 2021-05-01 DIAGNOSIS — M79674 Pain in right toe(s): Secondary | ICD-10-CM | POA: Diagnosis not present

## 2021-05-01 DIAGNOSIS — M2042 Other hammer toe(s) (acquired), left foot: Secondary | ICD-10-CM

## 2021-05-01 DIAGNOSIS — E1142 Type 2 diabetes mellitus with diabetic polyneuropathy: Secondary | ICD-10-CM

## 2021-05-01 DIAGNOSIS — L84 Corns and callosities: Secondary | ICD-10-CM

## 2021-05-01 NOTE — Progress Notes (Signed)
  Subjective:  Patient ID: Calvin Shields, male    DOB: 09-16-1956,  MRN: [id number]  Chief Complaint  Patient presents with  . Diabetes    At risk foot care. A1C  6.2, blood sugar this morning was 97  . Nail Problem  . Callouses  . Hammer Toe    Discuss surgery to correct hammertoes left second and third    65 y.o. male returns with the above complaint. History confirmed with patient.  Still using mupirocin, thinks it has gotten better.  Objective:  Physical Exam: warm, good capillary refill, no trophic changes or ulcerative lesions and normal DP and PT pulses.  Absent protective sensation.  The hammertoes of 2 through 5 are quite rigid.  He has a previous partial hallux amputation on the left foot.  Interdigital corn left fifth medial digit.  Onychomycosis x10 with thickened elongated yellow nail plates.  Callus on tip of left third and fourth toes.  Previous radiographs reviewed he has contracted hammertoes 2 through 5 on the left Assessment:   1. Hammertoe of left foot   2. Pain due to onychomycosis of toenails of both feet   3. Corns and callosities   4. Diabetic peripheral neuropathy associated with type 2 diabetes mellitus (HCC)   5. Controlled type 2 diabetes mellitus with diabetic polyneuropathy, without long-term current use of insulin (HCC)      Plan:  Patient was evaluated and treated and all questions answered.   Patient educated on diabetes. Discussed proper diabetic foot care and discussed risks and complications of disease. Educated patient in depth on reasons to return to the office immediately should he/she discover anything concerning or new on the feet. All questions answered. Discussed proper shoes as well.   All symptomatic hyperkeratoses were safely debrided with a sterile #15 blade to patient's level of comfort without incident. We discussed preventative and palliative care of these lesions including supportive and accommodative shoegear, padding,  prefabricated and custom molded accommodative orthoses, use of a pumice stone and lotions/creams daily.  Discussed the etiology and treatment options for the condition in detail with the patient. Educated patient on the topical and oral treatment options for mycotic nails. Recommended debridement of the nails today. Sharp and mechanical debridement performed of all painful and mycotic nails today. Nails debrided in length and thickness using a nail nipper to level of comfort. Discussed treatment options including appropriate shoe gear. Follow up as needed for painful nails.   We can discuss surgical invention for the hammertoe deformity on the left foot 2 through 5.  We discussed correction of these as an outpatient and will plan for soft tissue and possible osseous work with possible percutaneous pinning to correct the deformities.  He would like to plan this for mid to late September.  He will return in July to schedule consent and review.  We discussed the risk, benefits and potential complications of this.  This should address most of the hyperkeratosis to print them from occurring as well.  We also like to have the right hallux nail removed permanently at the same time.   Return in about 10 weeks (around 07/10/2021) for plan surgery for September on hammertoes, sign consent and paperwork .

## 2021-05-23 ENCOUNTER — Other Ambulatory Visit: Payer: Self-pay | Admitting: "Endocrinology

## 2021-05-28 DIAGNOSIS — I10 Essential (primary) hypertension: Secondary | ICD-10-CM | POA: Diagnosis not present

## 2021-05-28 DIAGNOSIS — E782 Mixed hyperlipidemia: Secondary | ICD-10-CM | POA: Diagnosis not present

## 2021-05-28 DIAGNOSIS — E1142 Type 2 diabetes mellitus with diabetic polyneuropathy: Secondary | ICD-10-CM | POA: Diagnosis not present

## 2021-05-29 DIAGNOSIS — M1711 Unilateral primary osteoarthritis, right knee: Secondary | ICD-10-CM | POA: Diagnosis not present

## 2021-06-18 ENCOUNTER — Ambulatory Visit: Payer: Medicare HMO | Admitting: "Endocrinology

## 2021-06-18 ENCOUNTER — Encounter: Payer: Self-pay | Admitting: "Endocrinology

## 2021-06-18 ENCOUNTER — Other Ambulatory Visit: Payer: Self-pay

## 2021-06-18 VITALS — BP 136/76 | HR 80 | Ht 65.5 in

## 2021-06-18 DIAGNOSIS — E782 Mixed hyperlipidemia: Secondary | ICD-10-CM | POA: Diagnosis not present

## 2021-06-18 DIAGNOSIS — I10 Essential (primary) hypertension: Secondary | ICD-10-CM

## 2021-06-18 DIAGNOSIS — E1165 Type 2 diabetes mellitus with hyperglycemia: Secondary | ICD-10-CM | POA: Diagnosis not present

## 2021-06-18 LAB — POCT GLYCOSYLATED HEMOGLOBIN (HGB A1C): HbA1c, POC (controlled diabetic range): 6 % (ref 0.0–7.0)

## 2021-06-18 LAB — GLUCOSE, POCT (MANUAL RESULT ENTRY): POC Glucose: 118 mg/dl — AB (ref 70–99)

## 2021-06-18 NOTE — Progress Notes (Signed)
06/18/2021   Endocrinology follow-up note   Subjective:    Patient ID: Calvin Shields, male    DOB: [birth year], PCP Richardean Chimera, MD   Past Medical History:  Diagnosis Date   Diabetes mellitus without complication [facility])    Hyperlipidemia    Hypertension    Past Surgical History:  Procedure Laterality Date   AMPUTATION Left 09/08/2017   Procedure: PARTIAL TOE AMPUTATION LEFT HALLUX;  Surgeon: Erskine Emery, DPM;  Location: [facility];  Service: Podiatry;  Laterality: Left;   right leg surgery       History reviewed. No pertinent family history.  Social History   Socioeconomic History   Marital status: Married    Spouse name: Not on file   Number of children: Not on file   Years of education: Not on file   Highest education level: Not on file  Occupational History   Not on file  Tobacco Use   Smoking status: Never   Smokeless tobacco: Never  Vaping Use   Vaping Use: Never used  Substance and Sexual Activity   Alcohol use: No   Drug use: No   Sexual activity: Not on file  Other Topics Concern   Not on file  Social History Narrative   Not on file   Social Determinants of Health   Financial Resource Strain: Not on file  Food Insecurity: Not on file  Transportation Needs: Not on file  Physical Activity: Not on file  Stress: Not on file  Social Connections: Not on file   Outpatient Encounter Medications as of 06/18/2021  Medication Sig   Dulaglutide (TRULICITY [place]) Inject into the skin once a week.   ACCU-CHEK FASTCLIX LANCETS MISC Use to test blood glucose 2 times a day.   acetaminophen-codeine (TYLENOL #3) 300-30 MG tablet    allopurinol (ZYLOPRIM) 100 MG tablet Take 100 mg by mouth daily.   amoxicillin (AMOXIL) 500 MG capsule    aspirin EC 81 MG tablet Take 81 mg by mouth daily.   cephALEXin (KEFLEX) 500 MG capsule Take 1 capsule (500 mg total) by mouth 3 (three) times daily.   cephALEXin (KEFLEX) 500 MG capsule Take 1 capsule (500 mg total) by  mouth 3 (three) times daily.   ciclopirox (PENLAC) 8 % solution    CONTOUR NEXT TEST test strip USE TO TEST BLOOD SUGAR TWICE DAILY AS DIRECTED.   gabapentin (NEURONTIN) 300 MG capsule    GLOBAL EASE INJECT PEN NEEDLES 31G X 8 MM MISC USE AS DIRECTED.   glucose blood (ACCU-CHEK GUIDE) test strip Use as instructed bid   HYDROcodone-acetaminophen (NORCO/VICODIN) 5-325 MG tablet    ibuprofen (ADVIL,MOTRIN) 800 MG tablet Take 800 mg by mouth every 8 (eight) hours as needed.   lisinopril-hydrochlorothiazide (PRINZIDE,ZESTORETIC) 20-25 MG per tablet Take 1 tablet by mouth daily.   metFORMIN (GLUCOPHAGE-XR) 500 MG 24 hr tablet    MITIGARE 0.6 MG CAPS    Multiple Vitamin (MULTIVITAMIN WITH MINERALS) TABS tablet Take 1 tablet by mouth daily.   mupirocin ointment (BACTROBAN) 2 % Apply to right 2nd toe once daily.   pantoprazole (PROTONIX) 40 MG tablet    pravastatin (PRAVACHOL) 40 MG tablet TAKE 1 TABLET BY MOUTH ONCE DAILY.   tamsulosin (FLOMAX) 0.4 MG CAPS capsule    terbinafine (LAMISIL) 250 MG tablet    traMADol (ULTRAM) 50 MG tablet    [DISCONTINUED] glipiZIDE (GLUCOTROL XL) 5 MG 24 hr tablet TAKE 1 TABLET ONCE DAILY WITH BREAKFAST.   [DISCONTINUED] VICTOZA 18 MG/3ML SOPN  Inject 1.8 mg into the skin daily before breakfast.   No facility-administered encounter medications on file as of 06/18/2021.   ALLERGIES: Allergies  Allergen Reactions   Codeine Nausea And Vomiting   Aspirin Other (See Comments)    Other reaction(s): Abdominal Pain, Other (See Comments)    Ibuprofen Other (See Comments)    Other reaction(s): Abdominal Pain, Other (See Comments)    VACCINATION STATUS: Immunization History  Administered Date(s) Administered   Influenza,inj,Quad PF,6+ Mos 10/13/2017, 10/06/2019    Diabetes He presents for his follow-up diabetic visit. He has type 2 diabetes mellitus. His disease course has been stable. There are no hypoglycemic associated symptoms. Pertinent negatives for  hypoglycemia include no confusion, headaches, pallor or seizures. Pertinent negatives for diabetes include no chest pain, no fatigue, no polydipsia, no polyphagia, no polyuria and no weakness. There are no hypoglycemic complications. Symptoms are stable. Diabetic complications include peripheral neuropathy. Risk factors for coronary artery disease include diabetes mellitus, dyslipidemia, hypertension, male sex, obesity and sedentary lifestyle. He is compliant with treatment most of the time. His weight is increasing steadily. He is following a generally unhealthy diet. When asked about meal planning, he reported none. He has had a previous visit with a dietitian. He never participates in exercise. His home blood glucose trend is decreasing steadily. (He did not bring any logs nor meter to review.  However, his point-of-care A1c is 6% consistent with tightly controlled glycemic profile.    ) An ACE inhibitor/angiotensin II receptor blocker is being taken. Eye exam is current.  Hyperlipidemia This is a chronic problem. The current episode started more than 1 year ago. The problem is uncontrolled. Exacerbating diseases include diabetes and obesity. Pertinent negatives include no chest pain, myalgias or shortness of breath. Current antihyperlipidemic treatment includes statins. Compliance problems include adherence to diet.  Risk factors for coronary artery disease include diabetes mellitus, dyslipidemia, hypertension, male sex, obesity and a sedentary lifestyle.  Hypertension This is a chronic problem. The current episode started more than 1 year ago. Pertinent negatives include no chest pain, headaches, neck pain, palpitations or shortness of breath. Risk factors for coronary artery disease include dyslipidemia, diabetes mellitus, obesity, male gender and sedentary lifestyle. Past treatments include ACE inhibitors.    Review of systems Limited as above.   Objective:    BP 136/76   Pulse 80   Ht 5'  5.5" (1.664 m)   BMI 41.00 kg/m   Wt Readings from Last 3 Encounters:  03/12/21 250 lb 3.2 oz (113.5 kg)  09/11/20 235 lb 3.2 oz (106.7 kg)  05/01/20 229 lb 9.6 oz (104.1 kg)        Recent Results (from the past 2160 hour(s))  Glucose (CBG)     Status: Abnormal   Collection Time: 06/18/21  2:01 PM  Result Value Ref Range   POC Glucose 118 (A) 70 - 99 mg/dl  HgB W7P     Status: None   Collection Time: 06/18/21  2:04 PM  Result Value Ref Range   Hemoglobin A1C     HbA1c POC (<> result, manual entry)     HbA1c, POC (prediabetic range)     HbA1c, POC (controlled diabetic range) 6.0 0.0 - 7.0 %   Lipid Panel     Component Value Date/Time   CHOL 180 12/18/2020 0000   TRIG 151 12/18/2020 0000   HDL 53 12/18/2020 0000   CHOLHDL 4.1 10/06/2018 1310   VLDL 29 04/15/2016 0836   LDLCALC 101 12/18/2020 0000  LDLCALC 114 (H) 10/06/2018 1310    Assessment & Plan:   1. Diabetes mellitus without complication (HCC)  - His diabetes is  complicated by peripheral neuropathy , peripheral arterial disease, Charcot's deformities and patient remains at a high risk for more acute and chronic complications of diabetes which include CAD, CVA, CKD, retinopathy, and neuropathy. These are all discussed in detail with the patient.  He did not bring any logs nor meter to review.  However, his point-of-care A1c is 6% consistent with tightly controlled glycemic profile.  He denies hypoglycemia.  He reports his fasting blood glucose is between 90-130.  - Recent labs reviewed with him, showing normal renal function.  - I have re-counseled the patient on diet management and  and weight loss by adopting a carbohydrate restricted / protein rich  Diet.  -He has gained weight, admits to dietary indiscretion.  - he acknowledges that there is a room for improvement in his food and drink choices. - Suggestion is made for him to avoid simple carbohydrates  from his diet including Cakes, Sweet Desserts, Ice  Cream, Soda (diet and regular), Sweet Tea, Candies, Chips, Cookies, Store Bought Juices, Alcohol in Excess of  1-2 drinks a day, Artificial Sweeteners,  Coffee Creamer, and "Sugar-free" Products, Lemonade. This will help patient to have more stable blood glucose profile and potentially avoid unintended weight gain.  - Patient is advised to stick to a routine mealtimes to eat 3 meals  a day and avoid unnecessary snacks ( to snack only to correct hypoglycemia).  - I have approached patient with the following individualized plan to manage diabetes and patient agrees.  -In light of his presentation with A1c of 6%, he will not need any tighter glycemic profile.  He is advised to discontinue glipizide.  He will continue on metformin 500 mg once daily after breakfast.  He reports that he is taking Trulicity instead of Victoza.  He will call clinic with the dose.  He is advised to continue Trulicity once a week.    - He agrees to monitor blood glucose at least twice a day-daily before breakfast and at bedtime.    - Patient specific target  for A1c; LDL, HDL, Triglycerides, and  Waist Circumference were discussed in detail.  2) BP/HTN:  His blood pressure is controlled to target.   He is advised to continue his current blood pressure medications including lisinopril-hydrochlorthiazide 20-25 mg p.o. daily.    3) Lipids/HPL: His recent lipid panel revealed uncontrolled LDL at 114.  He is advised to continue pravastatin 40 mg p.o. nightly.   Side effects and precautions discussed with him.   4)  Weight/Diet: His BMI is 34.24-a candidate for modest weight loss.  CDE consult in progress, exercise, and carbohydrates information provided.  5) Chronic Care/Health Maintenance:  -Patient is on ACEI/ARB and Statin medications and encouraged to continue to follow up with Ophthalmology, Podiatrist at least yearly or according to recommendations, and advised to  stay away from smoking. I have recommended yearly flu  vaccine and pneumonia vaccination at least every 5 years; moderate intensity exercise for up to 150 minutes weekly; and  sleep for at least 7 hours a day.  POCT ABI Results 06/18/21  Screening ABI is normal today March 12, 2021. Right ABI: 1.04      left ABI: 1.04  Right leg systolic / diastolic: 117/63 mmHg Left leg systolic / diastolic: 118/65 mmHg  Arm systolic / diastolic: 113/59 mmHG -This study will be repeated in  March 2027, or sooner if needed.  - I advised patient to maintain close follow up with Richardean Chimera, MD for primary care needs.    I spent 31 minutes in the care of the patient today including review of labs from CMP, Lipids, Thyroid Function, Hematology (current and previous including abstractions from other facilities); face-to-face time discussing  his blood glucose readings/logs, discussing hypoglycemia and hyperglycemia episodes and symptoms, medications doses, his options of short and long term treatment based on the latest standards of care / guidelines;  discussion about incorporating lifestyle medicine;  and documenting the encounter.    Please refer to Patient Instructions for Blood Glucose Monitoring and Insulin/Medications Dosing Guide"  in media tab for additional information. Please  also refer to " Patient Self Inventory" in the Media  tab for reviewed elements of pertinent patient history.  [facility] [address] participated in the discussions, expressed understanding, and voiced agreement with the above plans.  All questions were answered to his satisfaction. he is encouraged to contact clinic should he have any questions or concerns prior to his return visit.  Follow up plan: -Return in about 3 months (around 09/18/2021) for Bring Meter and Logs- A1c in Office.  Marquis Lunch, MD Phone: [phone]  Fax: [phone]  -  This note was partially dictated with voice recognition software. Similar sounding words can be transcribed inadequately or may not  be  corrected upon review.  06/18/2021, 4:52 PM

## 2021-06-18 NOTE — Patient Instructions (Signed)

## 2021-06-19 ENCOUNTER — Telehealth: Payer: Self-pay | Admitting: "Endocrinology

## 2021-06-19 NOTE — Telephone Encounter (Signed)
Pt was seen yesterday and states he was supposed to call and let you know the dosage of his insulin. States he is taking Trulicity 0.75mg 

## 2021-06-21 DIAGNOSIS — M1711 Unilateral primary osteoarthritis, right knee: Secondary | ICD-10-CM | POA: Diagnosis not present

## 2021-06-25 DIAGNOSIS — M170 Bilateral primary osteoarthritis of knee: Secondary | ICD-10-CM | POA: Diagnosis not present

## 2021-06-27 ENCOUNTER — Ambulatory Visit (INDEPENDENT_AMBULATORY_CARE_PROVIDER_SITE_OTHER): Payer: Medicare Other | Admitting: Podiatry

## 2021-06-27 ENCOUNTER — Other Ambulatory Visit: Payer: Self-pay

## 2021-06-27 ENCOUNTER — Other Ambulatory Visit: Payer: Medicare Other

## 2021-06-27 DIAGNOSIS — E1142 Type 2 diabetes mellitus with diabetic polyneuropathy: Secondary | ICD-10-CM | POA: Diagnosis not present

## 2021-06-27 DIAGNOSIS — Z89412 Acquired absence of left great toe: Secondary | ICD-10-CM | POA: Diagnosis not present

## 2021-06-27 DIAGNOSIS — M2041 Other hammer toe(s) (acquired), right foot: Secondary | ICD-10-CM

## 2021-06-27 DIAGNOSIS — L84 Corns and callosities: Secondary | ICD-10-CM | POA: Diagnosis not present

## 2021-06-27 DIAGNOSIS — M2042 Other hammer toe(s) (acquired), left foot: Secondary | ICD-10-CM

## 2021-06-27 NOTE — Progress Notes (Signed)
Patient presents to the office today to pick up diabetic shoes and 3 pair of diabetic custom inserts.  1 pair of inserts were put in the shoes and the shoes were fitted to the patient. The patient states they are comfortable and free of defect. He was satisfied with the fit of the shoe. Instructions for break in and wear were dispensed. The patient signed the delivery documentation and break in instruction form.  If any concerns or questions arise, he is instructed to call

## 2021-06-28 DIAGNOSIS — I10 Essential (primary) hypertension: Secondary | ICD-10-CM | POA: Diagnosis not present

## 2021-06-28 DIAGNOSIS — E1142 Type 2 diabetes mellitus with diabetic polyneuropathy: Secondary | ICD-10-CM | POA: Diagnosis not present

## 2021-06-28 DIAGNOSIS — E782 Mixed hyperlipidemia: Secondary | ICD-10-CM | POA: Diagnosis not present

## 2021-07-09 ENCOUNTER — Ambulatory Visit: Payer: Medicare HMO | Admitting: Podiatry

## 2021-07-09 LAB — HM DIABETES EYE EXAM

## 2021-07-10 ENCOUNTER — Ambulatory Visit: Payer: Medicare Other | Admitting: Podiatry

## 2021-07-16 ENCOUNTER — Other Ambulatory Visit: Payer: Self-pay

## 2021-07-16 ENCOUNTER — Ambulatory Visit (INDEPENDENT_AMBULATORY_CARE_PROVIDER_SITE_OTHER): Payer: Medicare Other | Admitting: Podiatry

## 2021-07-16 ENCOUNTER — Encounter: Payer: Self-pay | Admitting: Podiatry

## 2021-07-16 DIAGNOSIS — M79675 Pain in left toe(s): Secondary | ICD-10-CM | POA: Diagnosis not present

## 2021-07-16 DIAGNOSIS — B351 Tinea unguium: Secondary | ICD-10-CM | POA: Diagnosis not present

## 2021-07-16 DIAGNOSIS — L97512 Non-pressure chronic ulcer of other part of right foot with fat layer exposed: Secondary | ICD-10-CM

## 2021-07-16 DIAGNOSIS — M79674 Pain in right toe(s): Secondary | ICD-10-CM

## 2021-07-16 DIAGNOSIS — E1142 Type 2 diabetes mellitus with diabetic polyneuropathy: Secondary | ICD-10-CM

## 2021-07-16 DIAGNOSIS — E11621 Type 2 diabetes mellitus with foot ulcer: Secondary | ICD-10-CM

## 2021-07-16 MED ORDER — WOUND CLEANSER EX LIQD: CUTANEOUS | 0 refills | Status: DC

## 2021-07-16 MED ORDER — CADEXOMER IODINE 0.9 % EX GEL: CUTANEOUS | 0 refills | Status: DC

## 2021-07-16 NOTE — Progress Notes (Signed)
Subjective:  Patient ID: Calvin Shields, male    DOB: 03/26/1956,  MRN: [id number]  65 y.o. male presents with at risk foot care. 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    Patient's blood sugar was 98 mg/dl today.  He is concerned about left 3rd digit and states it is painful today. He has been followed by Calvin Shields for this. States he is to see him again in 2 weeks. Patient denies any redness, swelling or drainage of digit. He denies any fever, chills, nightsweats, nausea or vomiting.   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  PCP: Angiulli, Day G, MD and last visit was: last month  Review of Systems: Negative except as noted in the HPI.   Allergies  Allergen Reactions   Codeine Nausea And Vomiting   Aspirin Other (See Comments)    Other reaction(s): Abdominal Pain, Other (See Comments)    Ibuprofen Other (See Comments)    Other reaction(s): Abdominal Pain, Other (See Comments)     Objective:  There were no vitals filed for this visit. Constitutional Patient is a pleasant 65 y.o. African American male morbidly obese in NAD. AAO x 3.  Vascular Capillary fill time to digits <3 seconds b/l lower extremities. Palpable DP pulse(s) b/l lower extremities Faintly palpable PT pulse(s) b/l lower extremities. Pedal hair absent. Lower extremity skin temperature gradient within normal limits. No pain with calf compression b/l. Trace edema noted b/l lower extremities. No cyanosis or clubbing noted.  Neurologic Normal speech. Protective sensation diminished with 10g monofilament b/l. Vibratory sensation diminished b/l.    Dermatologic No interdigital macerations b/l lower extremities. Toenails 2-5 bilaterally and R hallux elongated, discolored, dystrophic, thickened, and crumbly with subungual debris and tenderness to dorsal palpation. Porokeratotic lesion(s) interdigitally medial aspect 5th digit left foot. No erythema, no  edema, no drainage, no fluctuance.  Wound Location: L 3rd toe There is a moderate amount of devitalized tissue present in the wound. Predebridement Wound Measurement:  0  x 0 x 0 cm. Postdebridement Wound Measurement: 0.3 x 1.0 x 0.2cm. Wound Base: pink granulation tissue Peri-wound: Calloused Exudate: Scant/small amount Serous exudate Blood Loss during debridement: 0 cc('s). Material in wound which inhibits healing/promotes adjacent tissue breakdown:  nonviable hyperkeratosis. Description of tissue removed from ulceration today:  nonviable hyperkeratosis. Sign(s) of clinical bacterial infection: no clinical signs of infection noted on examination today.   Orthopedic: Normal muscle strength 5/5 to all lower extremity muscle groups bilaterally. Clawtoe deformity lesser digits b/l. Lower extremity amputation(s): partial amputation of L hallux.   Hemoglobin A1C Latest Ref Rng & Units 06/18/2021 12/18/2020 08/14/2020  HGBA1C 0.0 - 7.0 % 6.0 6.2 6.4  Some recent data might be hidden   Assessment:   1. Pain due to onychomycosis of toenails of both feet   2. Diabetic ulcer of toe of right foot associated with type 2 diabetes mellitus, with fat layer exposed (HCC)   3. Diabetic peripheral neuropathy associated with type 2 diabetes mellitus (HCC)    Plan:  Plan: -Patient was evaluated and treated and all questions answered.  -Patient/POA/Family member educated on diagnosis and treatment plan of routine ulcer debridement/wound care.  -Ulceration debridement achieved utilizing sharp excisional debridement with sterile scalpel blade.. Type/amount of devitalized tissue removed: nonviable hyperkeratosis -Today's ulcer size post-debridement: 0.3 x  x 1.0 x 0.2 cm distal tip left 3rd toe -Ulceration cleansed with wound cleanser. Iodosorb Gel applied to base of  ulceration and secured with light dressing. -Wound responded well to today's debridement. -Patient risk factors affecting healing of ulcer:  diabetes, diabetic neuropathy, history of prior amputation, foot deformity -Calvin Shields given written instructions on daily wound care for L 3rd toe ulceration. -Rx sent to [facility] for Iodosorb Gel and Wound cleanser. -Toenails 2-5 bilaterally and R hallux debrided in length and girth without iatrogenic bleeding with sterile nail nipper and dremel.  -Patient to report any pedal injuries to medical professional immediately. -Patient to follow up with Dr. Sharl Ma in one week for left 3rd toe ulceration. -Dispensed toe crest for both feet with visual and verbal instructions on applying pads. These are to assist the toes in staying in a more rectus position and take pressure from the tip of the toes. Apply every morning. Remove every evening. -Patient/POA to call should there be question/concern in the interim.  Return in about 1 week (around 07/23/2021) for diabetic ulcer left 3rd toe with Dr. Sharl Ma.  Freddie Breech, DPM

## 2021-07-16 NOTE — Patient Instructions (Signed)
DRESSING CHANGES L 3rd toe:   PHARMACY SHOPPING LIST: Saline or Wound Cleanser for cleaning wound 2 x 2 inch sterile gauze for cleaning wound Prescribed Iodosorb Gel  A. IF DISPENSED, WEAR SURGICAL SHOE OR WALKING BOOT AT ALL TIMES.  B. IF PRESCRIBED ORAL ANTIBIOTICS, TAKE ALL MEDICATION AS PRESCRIBED UNTIL ALL ARE GONE.  C. IF DOCTOR HAS DESIGNATED NONWEIGHTBEARING STATUS, PLEASE ADHERE TO INSTRUCTIONS.   1. KEEP L 3rd toe DRY AT ALL TIMES!!!!  2. CLEANSE ULCER WITH SALINE OR WOUND CLEANSER.  3. DAB DRY WITH GAUZE SPONGE.  4. APPLY A LIGHT AMOUNT OF Iodosorb Gel TO BASE OF ULCER.  5. APPLY OUTER DRESSING AS INSTRUCTED.  6. WEAR SURGICAL SHOE/BOOT DAILY AT ALL TIMES. IF SUPPLIED, WEAR HEEL PROTECTORS AT ALL TIMES WHEN IN BED.  7. DO NOT WALK BAREFOOT!!!  8.  IF YOU EXPERIENCE ANY FEVER, CHILLS, NIGHTSWEATS, NAUSEA OR VOMITING, ELEVATED OR LOW BLOOD SUGARS, REPORT TO EMERGENCY ROOM.  9. IF YOU EXPERIENCE INCREASED REDNESS, PAIN, SWELLING, DISCOLORATION, ODOR, PUS, DRAINAGE OR WARMTH OF YOUR FOOT, REPORT TO EMERGENCY ROOM.

## 2021-07-23 DIAGNOSIS — M1711 Unilateral primary osteoarthritis, right knee: Secondary | ICD-10-CM | POA: Diagnosis not present

## 2021-07-29 DIAGNOSIS — I10 Essential (primary) hypertension: Secondary | ICD-10-CM | POA: Diagnosis not present

## 2021-07-29 DIAGNOSIS — E1142 Type 2 diabetes mellitus with diabetic polyneuropathy: Secondary | ICD-10-CM | POA: Diagnosis not present

## 2021-07-29 DIAGNOSIS — E782 Mixed hyperlipidemia: Secondary | ICD-10-CM | POA: Diagnosis not present

## 2021-08-27 DIAGNOSIS — E1142 Type 2 diabetes mellitus with diabetic polyneuropathy: Secondary | ICD-10-CM | POA: Diagnosis not present

## 2021-08-27 DIAGNOSIS — E782 Mixed hyperlipidemia: Secondary | ICD-10-CM | POA: Diagnosis not present

## 2021-08-27 DIAGNOSIS — Z0001 Encounter for general adult medical examination with abnormal findings: Secondary | ICD-10-CM | POA: Diagnosis not present

## 2021-08-27 DIAGNOSIS — Z1329 Encounter for screening for other suspected endocrine disorder: Secondary | ICD-10-CM | POA: Diagnosis not present

## 2021-08-27 DIAGNOSIS — R5382 Chronic fatigue, unspecified: Secondary | ICD-10-CM | POA: Diagnosis not present

## 2021-08-27 DIAGNOSIS — E7849 Other hyperlipidemia: Secondary | ICD-10-CM | POA: Diagnosis not present

## 2021-08-27 DIAGNOSIS — I10 Essential (primary) hypertension: Secondary | ICD-10-CM | POA: Diagnosis not present

## 2021-09-10 ENCOUNTER — Ambulatory Visit: Payer: Medicare Other | Admitting: Podiatry

## 2021-09-17 DIAGNOSIS — Z0001 Encounter for general adult medical examination with abnormal findings: Secondary | ICD-10-CM | POA: Diagnosis not present

## 2021-09-17 DIAGNOSIS — I10 Essential (primary) hypertension: Secondary | ICD-10-CM | POA: Diagnosis not present

## 2021-09-17 DIAGNOSIS — N1831 Chronic kidney disease, stage 3a: Secondary | ICD-10-CM | POA: Diagnosis not present

## 2021-09-17 DIAGNOSIS — E119 Type 2 diabetes mellitus without complications: Secondary | ICD-10-CM | POA: Diagnosis not present

## 2021-09-17 DIAGNOSIS — Z23 Encounter for immunization: Secondary | ICD-10-CM | POA: Diagnosis not present

## 2021-09-17 DIAGNOSIS — E7849 Other hyperlipidemia: Secondary | ICD-10-CM | POA: Diagnosis not present

## 2021-09-17 DIAGNOSIS — R4582 Worries: Secondary | ICD-10-CM | POA: Diagnosis not present

## 2021-09-17 DIAGNOSIS — R079 Chest pain, unspecified: Secondary | ICD-10-CM | POA: Diagnosis not present

## 2021-09-17 DIAGNOSIS — R9431 Abnormal electrocardiogram [ECG] [EKG]: Secondary | ICD-10-CM | POA: Diagnosis not present

## 2021-09-17 DIAGNOSIS — R0602 Shortness of breath: Secondary | ICD-10-CM | POA: Diagnosis not present

## 2021-09-17 DIAGNOSIS — E876 Hypokalemia: Secondary | ICD-10-CM | POA: Diagnosis not present

## 2021-09-17 DIAGNOSIS — E1142 Type 2 diabetes mellitus with diabetic polyneuropathy: Secondary | ICD-10-CM | POA: Diagnosis not present

## 2021-09-17 DIAGNOSIS — I700 Atherosclerosis of aorta: Secondary | ICD-10-CM | POA: Diagnosis not present

## 2021-09-19 DIAGNOSIS — R1011 Right upper quadrant pain: Secondary | ICD-10-CM | POA: Diagnosis not present

## 2021-09-19 DIAGNOSIS — R1110 Vomiting, unspecified: Secondary | ICD-10-CM | POA: Diagnosis not present

## 2021-09-20 DIAGNOSIS — E1142 Type 2 diabetes mellitus with diabetic polyneuropathy: Secondary | ICD-10-CM | POA: Diagnosis not present

## 2021-09-20 DIAGNOSIS — Z886 Allergy status to analgesic agent status: Secondary | ICD-10-CM | POA: Diagnosis not present

## 2021-09-20 DIAGNOSIS — R350 Frequency of micturition: Secondary | ICD-10-CM | POA: Diagnosis not present

## 2021-09-20 DIAGNOSIS — K429 Umbilical hernia without obstruction or gangrene: Secondary | ICD-10-CM | POA: Diagnosis not present

## 2021-09-20 DIAGNOSIS — K839 Disease of biliary tract, unspecified: Secondary | ICD-10-CM | POA: Diagnosis not present

## 2021-09-20 DIAGNOSIS — K828 Other specified diseases of gallbladder: Secondary | ICD-10-CM | POA: Diagnosis not present

## 2021-09-20 DIAGNOSIS — N200 Calculus of kidney: Secondary | ICD-10-CM | POA: Diagnosis not present

## 2021-09-20 DIAGNOSIS — Z7984 Long term (current) use of oral hypoglycemic drugs: Secondary | ICD-10-CM | POA: Diagnosis not present

## 2021-09-20 DIAGNOSIS — E871 Hypo-osmolality and hyponatremia: Secondary | ICD-10-CM | POA: Diagnosis not present

## 2021-09-20 DIAGNOSIS — Z20822 Contact with and (suspected) exposure to covid-19: Secondary | ICD-10-CM | POA: Diagnosis not present

## 2021-09-20 DIAGNOSIS — K219 Gastro-esophageal reflux disease without esophagitis: Secondary | ICD-10-CM | POA: Diagnosis not present

## 2021-09-20 DIAGNOSIS — Z7982 Long term (current) use of aspirin: Secondary | ICD-10-CM | POA: Diagnosis not present

## 2021-09-20 DIAGNOSIS — E876 Hypokalemia: Secondary | ICD-10-CM | POA: Diagnosis not present

## 2021-09-20 DIAGNOSIS — R1110 Vomiting, unspecified: Secondary | ICD-10-CM | POA: Diagnosis not present

## 2021-09-20 DIAGNOSIS — K760 Fatty (change of) liver, not elsewhere classified: Secondary | ICD-10-CM | POA: Diagnosis not present

## 2021-09-20 DIAGNOSIS — Z885 Allergy status to narcotic agent status: Secondary | ICD-10-CM | POA: Diagnosis not present

## 2021-09-20 DIAGNOSIS — I10 Essential (primary) hypertension: Secondary | ICD-10-CM | POA: Diagnosis not present

## 2021-09-20 DIAGNOSIS — M170 Bilateral primary osteoarthritis of knee: Secondary | ICD-10-CM | POA: Diagnosis not present

## 2021-09-20 DIAGNOSIS — R1011 Right upper quadrant pain: Secondary | ICD-10-CM | POA: Diagnosis not present

## 2021-09-20 DIAGNOSIS — R112 Nausea with vomiting, unspecified: Secondary | ICD-10-CM | POA: Diagnosis not present

## 2021-09-20 DIAGNOSIS — E785 Hyperlipidemia, unspecified: Secondary | ICD-10-CM | POA: Diagnosis not present

## 2021-09-20 DIAGNOSIS — Z794 Long term (current) use of insulin: Secondary | ICD-10-CM | POA: Diagnosis not present

## 2021-09-20 DIAGNOSIS — R932 Abnormal findings on diagnostic imaging of liver and biliary tract: Secondary | ICD-10-CM | POA: Diagnosis not present

## 2021-09-20 DIAGNOSIS — M109 Gout, unspecified: Secondary | ICD-10-CM | POA: Diagnosis not present

## 2021-09-20 DIAGNOSIS — K5730 Diverticulosis of large intestine without perforation or abscess without bleeding: Secondary | ICD-10-CM | POA: Diagnosis not present

## 2021-09-20 DIAGNOSIS — N3289 Other specified disorders of bladder: Secondary | ICD-10-CM | POA: Diagnosis not present

## 2021-09-20 DIAGNOSIS — K811 Chronic cholecystitis: Secondary | ICD-10-CM | POA: Diagnosis not present

## 2021-09-20 DIAGNOSIS — D7389 Other diseases of spleen: Secondary | ICD-10-CM | POA: Diagnosis not present

## 2021-09-20 DIAGNOSIS — I700 Atherosclerosis of aorta: Secondary | ICD-10-CM | POA: Diagnosis not present

## 2021-09-21 DIAGNOSIS — K5730 Diverticulosis of large intestine without perforation or abscess without bleeding: Secondary | ICD-10-CM | POA: Diagnosis not present

## 2021-09-21 DIAGNOSIS — N401 Enlarged prostate with lower urinary tract symptoms: Secondary | ICD-10-CM | POA: Insufficient documentation

## 2021-09-21 DIAGNOSIS — N200 Calculus of kidney: Secondary | ICD-10-CM | POA: Diagnosis not present

## 2021-09-21 DIAGNOSIS — E876 Hypokalemia: Secondary | ICD-10-CM | POA: Diagnosis not present

## 2021-09-21 DIAGNOSIS — Z7982 Long term (current) use of aspirin: Secondary | ICD-10-CM | POA: Diagnosis not present

## 2021-09-21 DIAGNOSIS — E871 Hypo-osmolality and hyponatremia: Secondary | ICD-10-CM | POA: Diagnosis not present

## 2021-09-21 DIAGNOSIS — R350 Frequency of micturition: Secondary | ICD-10-CM | POA: Diagnosis not present

## 2021-09-21 DIAGNOSIS — Z794 Long term (current) use of insulin: Secondary | ICD-10-CM | POA: Diagnosis not present

## 2021-09-21 DIAGNOSIS — M109 Gout, unspecified: Secondary | ICD-10-CM | POA: Diagnosis not present

## 2021-09-21 DIAGNOSIS — K219 Gastro-esophageal reflux disease without esophagitis: Secondary | ICD-10-CM | POA: Diagnosis not present

## 2021-09-21 DIAGNOSIS — K828 Other specified diseases of gallbladder: Secondary | ICD-10-CM | POA: Diagnosis not present

## 2021-09-21 DIAGNOSIS — E785 Hyperlipidemia, unspecified: Secondary | ICD-10-CM | POA: Diagnosis not present

## 2021-09-21 DIAGNOSIS — R112 Nausea with vomiting, unspecified: Secondary | ICD-10-CM | POA: Diagnosis not present

## 2021-09-21 DIAGNOSIS — R932 Abnormal findings on diagnostic imaging of liver and biliary tract: Secondary | ICD-10-CM | POA: Diagnosis not present

## 2021-09-21 DIAGNOSIS — I700 Atherosclerosis of aorta: Secondary | ICD-10-CM | POA: Diagnosis not present

## 2021-09-21 DIAGNOSIS — K760 Fatty (change of) liver, not elsewhere classified: Secondary | ICD-10-CM | POA: Diagnosis not present

## 2021-09-21 DIAGNOSIS — Z886 Allergy status to analgesic agent status: Secondary | ICD-10-CM | POA: Diagnosis not present

## 2021-09-21 DIAGNOSIS — Z7984 Long term (current) use of oral hypoglycemic drugs: Secondary | ICD-10-CM | POA: Diagnosis not present

## 2021-09-21 DIAGNOSIS — I10 Essential (primary) hypertension: Secondary | ICD-10-CM | POA: Diagnosis not present

## 2021-09-21 DIAGNOSIS — M170 Bilateral primary osteoarthritis of knee: Secondary | ICD-10-CM | POA: Diagnosis not present

## 2021-09-21 DIAGNOSIS — K811 Chronic cholecystitis: Secondary | ICD-10-CM | POA: Diagnosis not present

## 2021-09-21 DIAGNOSIS — K429 Umbilical hernia without obstruction or gangrene: Secondary | ICD-10-CM | POA: Diagnosis not present

## 2021-09-21 DIAGNOSIS — N3289 Other specified disorders of bladder: Secondary | ICD-10-CM | POA: Diagnosis not present

## 2021-09-21 DIAGNOSIS — E1142 Type 2 diabetes mellitus with diabetic polyneuropathy: Secondary | ICD-10-CM | POA: Diagnosis not present

## 2021-09-21 DIAGNOSIS — Z20822 Contact with and (suspected) exposure to covid-19: Secondary | ICD-10-CM | POA: Diagnosis not present

## 2021-09-21 DIAGNOSIS — R1011 Right upper quadrant pain: Secondary | ICD-10-CM | POA: Insufficient documentation

## 2021-09-21 DIAGNOSIS — Z885 Allergy status to narcotic agent status: Secondary | ICD-10-CM | POA: Diagnosis not present

## 2021-09-22 DIAGNOSIS — K839 Disease of biliary tract, unspecified: Secondary | ICD-10-CM | POA: Diagnosis not present

## 2021-09-22 DIAGNOSIS — K811 Chronic cholecystitis: Secondary | ICD-10-CM | POA: Diagnosis not present

## 2021-09-22 DIAGNOSIS — K828 Other specified diseases of gallbladder: Secondary | ICD-10-CM | POA: Diagnosis not present

## 2021-09-22 DIAGNOSIS — M109 Gout, unspecified: Secondary | ICD-10-CM | POA: Diagnosis not present

## 2021-09-22 DIAGNOSIS — E1142 Type 2 diabetes mellitus with diabetic polyneuropathy: Secondary | ICD-10-CM | POA: Diagnosis not present

## 2021-09-22 DIAGNOSIS — Z7982 Long term (current) use of aspirin: Secondary | ICD-10-CM | POA: Diagnosis not present

## 2021-09-22 DIAGNOSIS — K5730 Diverticulosis of large intestine without perforation or abscess without bleeding: Secondary | ICD-10-CM | POA: Diagnosis not present

## 2021-09-22 DIAGNOSIS — I10 Essential (primary) hypertension: Secondary | ICD-10-CM | POA: Diagnosis not present

## 2021-09-22 DIAGNOSIS — I700 Atherosclerosis of aorta: Secondary | ICD-10-CM | POA: Diagnosis not present

## 2021-09-22 DIAGNOSIS — K760 Fatty (change of) liver, not elsewhere classified: Secondary | ICD-10-CM | POA: Diagnosis not present

## 2021-09-22 DIAGNOSIS — E871 Hypo-osmolality and hyponatremia: Secondary | ICD-10-CM | POA: Diagnosis not present

## 2021-09-22 DIAGNOSIS — Z7984 Long term (current) use of oral hypoglycemic drugs: Secondary | ICD-10-CM | POA: Diagnosis not present

## 2021-09-22 DIAGNOSIS — M170 Bilateral primary osteoarthritis of knee: Secondary | ICD-10-CM | POA: Diagnosis not present

## 2021-09-22 DIAGNOSIS — K429 Umbilical hernia without obstruction or gangrene: Secondary | ICD-10-CM | POA: Diagnosis not present

## 2021-09-22 DIAGNOSIS — Z794 Long term (current) use of insulin: Secondary | ICD-10-CM | POA: Diagnosis not present

## 2021-09-22 DIAGNOSIS — R350 Frequency of micturition: Secondary | ICD-10-CM | POA: Diagnosis not present

## 2021-09-22 DIAGNOSIS — Z886 Allergy status to analgesic agent status: Secondary | ICD-10-CM | POA: Diagnosis not present

## 2021-09-22 DIAGNOSIS — E876 Hypokalemia: Secondary | ICD-10-CM | POA: Diagnosis not present

## 2021-09-22 DIAGNOSIS — Z20822 Contact with and (suspected) exposure to covid-19: Secondary | ICD-10-CM | POA: Diagnosis not present

## 2021-09-22 DIAGNOSIS — K219 Gastro-esophageal reflux disease without esophagitis: Secondary | ICD-10-CM | POA: Diagnosis not present

## 2021-09-22 DIAGNOSIS — Z885 Allergy status to narcotic agent status: Secondary | ICD-10-CM | POA: Diagnosis not present

## 2021-09-22 DIAGNOSIS — E785 Hyperlipidemia, unspecified: Secondary | ICD-10-CM | POA: Diagnosis not present

## 2021-09-23 DIAGNOSIS — K5730 Diverticulosis of large intestine without perforation or abscess without bleeding: Secondary | ICD-10-CM | POA: Diagnosis not present

## 2021-09-23 DIAGNOSIS — E785 Hyperlipidemia, unspecified: Secondary | ICD-10-CM | POA: Diagnosis not present

## 2021-09-23 DIAGNOSIS — Z794 Long term (current) use of insulin: Secondary | ICD-10-CM | POA: Diagnosis not present

## 2021-09-23 DIAGNOSIS — R350 Frequency of micturition: Secondary | ICD-10-CM | POA: Diagnosis not present

## 2021-09-23 DIAGNOSIS — K839 Disease of biliary tract, unspecified: Secondary | ICD-10-CM | POA: Diagnosis not present

## 2021-09-23 DIAGNOSIS — E876 Hypokalemia: Secondary | ICD-10-CM | POA: Insufficient documentation

## 2021-09-23 DIAGNOSIS — K828 Other specified diseases of gallbladder: Secondary | ICD-10-CM | POA: Diagnosis not present

## 2021-09-23 DIAGNOSIS — Z7982 Long term (current) use of aspirin: Secondary | ICD-10-CM | POA: Diagnosis not present

## 2021-09-23 DIAGNOSIS — K219 Gastro-esophageal reflux disease without esophagitis: Secondary | ICD-10-CM | POA: Diagnosis not present

## 2021-09-23 DIAGNOSIS — E1142 Type 2 diabetes mellitus with diabetic polyneuropathy: Secondary | ICD-10-CM | POA: Diagnosis not present

## 2021-09-23 DIAGNOSIS — K429 Umbilical hernia without obstruction or gangrene: Secondary | ICD-10-CM | POA: Diagnosis not present

## 2021-09-23 DIAGNOSIS — Z886 Allergy status to analgesic agent status: Secondary | ICD-10-CM | POA: Diagnosis not present

## 2021-09-23 DIAGNOSIS — K811 Chronic cholecystitis: Secondary | ICD-10-CM | POA: Diagnosis not present

## 2021-09-23 DIAGNOSIS — K760 Fatty (change of) liver, not elsewhere classified: Secondary | ICD-10-CM | POA: Diagnosis not present

## 2021-09-23 DIAGNOSIS — M109 Gout, unspecified: Secondary | ICD-10-CM | POA: Diagnosis not present

## 2021-09-23 DIAGNOSIS — M170 Bilateral primary osteoarthritis of knee: Secondary | ICD-10-CM | POA: Diagnosis not present

## 2021-09-23 DIAGNOSIS — Z20822 Contact with and (suspected) exposure to covid-19: Secondary | ICD-10-CM | POA: Diagnosis not present

## 2021-09-23 DIAGNOSIS — I700 Atherosclerosis of aorta: Secondary | ICD-10-CM | POA: Diagnosis not present

## 2021-09-23 DIAGNOSIS — E871 Hypo-osmolality and hyponatremia: Secondary | ICD-10-CM | POA: Diagnosis not present

## 2021-09-23 DIAGNOSIS — Z885 Allergy status to narcotic agent status: Secondary | ICD-10-CM | POA: Diagnosis not present

## 2021-09-23 DIAGNOSIS — Z7984 Long term (current) use of oral hypoglycemic drugs: Secondary | ICD-10-CM | POA: Diagnosis not present

## 2021-09-23 DIAGNOSIS — I10 Essential (primary) hypertension: Secondary | ICD-10-CM | POA: Diagnosis not present

## 2021-09-24 ENCOUNTER — Ambulatory Visit: Payer: Medicare Other | Admitting: Podiatry

## 2021-09-24 ENCOUNTER — Ambulatory Visit: Payer: Medicare Other | Admitting: "Endocrinology

## 2021-09-24 DIAGNOSIS — E876 Hypokalemia: Secondary | ICD-10-CM | POA: Diagnosis not present

## 2021-09-24 DIAGNOSIS — M170 Bilateral primary osteoarthritis of knee: Secondary | ICD-10-CM | POA: Diagnosis not present

## 2021-09-24 DIAGNOSIS — Z7984 Long term (current) use of oral hypoglycemic drugs: Secondary | ICD-10-CM | POA: Diagnosis not present

## 2021-09-24 DIAGNOSIS — K5730 Diverticulosis of large intestine without perforation or abscess without bleeding: Secondary | ICD-10-CM | POA: Diagnosis not present

## 2021-09-24 DIAGNOSIS — E871 Hypo-osmolality and hyponatremia: Secondary | ICD-10-CM | POA: Diagnosis not present

## 2021-09-24 DIAGNOSIS — K760 Fatty (change of) liver, not elsewhere classified: Secondary | ICD-10-CM | POA: Diagnosis not present

## 2021-09-24 DIAGNOSIS — R350 Frequency of micturition: Secondary | ICD-10-CM | POA: Diagnosis not present

## 2021-09-24 DIAGNOSIS — I10 Essential (primary) hypertension: Secondary | ICD-10-CM | POA: Diagnosis not present

## 2021-09-24 DIAGNOSIS — M109 Gout, unspecified: Secondary | ICD-10-CM | POA: Diagnosis not present

## 2021-09-24 DIAGNOSIS — I700 Atherosclerosis of aorta: Secondary | ICD-10-CM | POA: Diagnosis not present

## 2021-09-24 DIAGNOSIS — E1142 Type 2 diabetes mellitus with diabetic polyneuropathy: Secondary | ICD-10-CM | POA: Diagnosis not present

## 2021-09-24 DIAGNOSIS — K839 Disease of biliary tract, unspecified: Secondary | ICD-10-CM | POA: Diagnosis not present

## 2021-09-24 DIAGNOSIS — Z7982 Long term (current) use of aspirin: Secondary | ICD-10-CM | POA: Diagnosis not present

## 2021-09-24 DIAGNOSIS — Z886 Allergy status to analgesic agent status: Secondary | ICD-10-CM | POA: Diagnosis not present

## 2021-09-24 DIAGNOSIS — K219 Gastro-esophageal reflux disease without esophagitis: Secondary | ICD-10-CM | POA: Diagnosis not present

## 2021-09-24 DIAGNOSIS — E785 Hyperlipidemia, unspecified: Secondary | ICD-10-CM | POA: Diagnosis not present

## 2021-09-24 DIAGNOSIS — Z20822 Contact with and (suspected) exposure to covid-19: Secondary | ICD-10-CM | POA: Diagnosis not present

## 2021-09-24 DIAGNOSIS — Z885 Allergy status to narcotic agent status: Secondary | ICD-10-CM | POA: Diagnosis not present

## 2021-09-24 DIAGNOSIS — K828 Other specified diseases of gallbladder: Secondary | ICD-10-CM | POA: Diagnosis not present

## 2021-09-24 DIAGNOSIS — Z794 Long term (current) use of insulin: Secondary | ICD-10-CM | POA: Diagnosis not present

## 2021-09-24 DIAGNOSIS — K811 Chronic cholecystitis: Secondary | ICD-10-CM | POA: Diagnosis not present

## 2021-09-24 DIAGNOSIS — K429 Umbilical hernia without obstruction or gangrene: Secondary | ICD-10-CM | POA: Diagnosis not present

## 2021-09-25 DIAGNOSIS — M170 Bilateral primary osteoarthritis of knee: Secondary | ICD-10-CM | POA: Diagnosis not present

## 2021-09-25 DIAGNOSIS — K828 Other specified diseases of gallbladder: Secondary | ICD-10-CM | POA: Diagnosis not present

## 2021-09-25 DIAGNOSIS — Z886 Allergy status to analgesic agent status: Secondary | ICD-10-CM | POA: Diagnosis not present

## 2021-09-25 DIAGNOSIS — Z885 Allergy status to narcotic agent status: Secondary | ICD-10-CM | POA: Diagnosis not present

## 2021-09-25 DIAGNOSIS — Z7984 Long term (current) use of oral hypoglycemic drugs: Secondary | ICD-10-CM | POA: Diagnosis not present

## 2021-09-25 DIAGNOSIS — E871 Hypo-osmolality and hyponatremia: Secondary | ICD-10-CM | POA: Diagnosis not present

## 2021-09-25 DIAGNOSIS — E785 Hyperlipidemia, unspecified: Secondary | ICD-10-CM | POA: Diagnosis not present

## 2021-09-25 DIAGNOSIS — R350 Frequency of micturition: Secondary | ICD-10-CM | POA: Diagnosis not present

## 2021-09-25 DIAGNOSIS — E1142 Type 2 diabetes mellitus with diabetic polyneuropathy: Secondary | ICD-10-CM | POA: Diagnosis not present

## 2021-09-25 DIAGNOSIS — Z794 Long term (current) use of insulin: Secondary | ICD-10-CM | POA: Diagnosis not present

## 2021-09-25 DIAGNOSIS — I10 Essential (primary) hypertension: Secondary | ICD-10-CM | POA: Diagnosis not present

## 2021-09-25 DIAGNOSIS — I700 Atherosclerosis of aorta: Secondary | ICD-10-CM | POA: Diagnosis not present

## 2021-09-25 DIAGNOSIS — Z20822 Contact with and (suspected) exposure to covid-19: Secondary | ICD-10-CM | POA: Diagnosis not present

## 2021-09-25 DIAGNOSIS — K219 Gastro-esophageal reflux disease without esophagitis: Secondary | ICD-10-CM | POA: Diagnosis not present

## 2021-09-25 DIAGNOSIS — M109 Gout, unspecified: Secondary | ICD-10-CM | POA: Diagnosis not present

## 2021-09-25 DIAGNOSIS — E876 Hypokalemia: Secondary | ICD-10-CM | POA: Diagnosis not present

## 2021-09-25 DIAGNOSIS — K811 Chronic cholecystitis: Secondary | ICD-10-CM | POA: Diagnosis not present

## 2021-09-25 DIAGNOSIS — K760 Fatty (change of) liver, not elsewhere classified: Secondary | ICD-10-CM | POA: Diagnosis not present

## 2021-09-25 DIAGNOSIS — Z7982 Long term (current) use of aspirin: Secondary | ICD-10-CM | POA: Diagnosis not present

## 2021-09-25 DIAGNOSIS — K5730 Diverticulosis of large intestine without perforation or abscess without bleeding: Secondary | ICD-10-CM | POA: Diagnosis not present

## 2021-09-25 DIAGNOSIS — K429 Umbilical hernia without obstruction or gangrene: Secondary | ICD-10-CM | POA: Diagnosis not present

## 2021-09-27 ENCOUNTER — Other Ambulatory Visit: Payer: Self-pay | Admitting: "Endocrinology

## 2021-09-29 ENCOUNTER — Other Ambulatory Visit: Payer: Self-pay | Admitting: "Endocrinology

## 2021-10-01 ENCOUNTER — Ambulatory Visit (INDEPENDENT_AMBULATORY_CARE_PROVIDER_SITE_OTHER): Payer: Medicare Other | Admitting: "Endocrinology

## 2021-10-01 ENCOUNTER — Other Ambulatory Visit: Payer: Self-pay

## 2021-10-01 ENCOUNTER — Encounter: Payer: Self-pay | Admitting: "Endocrinology

## 2021-10-01 ENCOUNTER — Other Ambulatory Visit: Payer: Self-pay | Admitting: "Endocrinology

## 2021-10-01 VITALS — BP 128/74 | HR 92 | Ht 65.5 in

## 2021-10-01 DIAGNOSIS — I10 Essential (primary) hypertension: Secondary | ICD-10-CM

## 2021-10-01 DIAGNOSIS — E782 Mixed hyperlipidemia: Secondary | ICD-10-CM | POA: Diagnosis not present

## 2021-10-01 DIAGNOSIS — E1165 Type 2 diabetes mellitus with hyperglycemia: Secondary | ICD-10-CM

## 2021-10-01 DIAGNOSIS — Z23 Encounter for immunization: Secondary | ICD-10-CM | POA: Diagnosis not present

## 2021-10-01 LAB — POCT GLYCOSYLATED HEMOGLOBIN (HGB A1C): HbA1c, POC (controlled diabetic range): 5.8 % (ref 0.0–7.0)

## 2021-10-01 NOTE — Patient Instructions (Signed)

## 2021-10-01 NOTE — Progress Notes (Signed)
10/01/2021   Endocrinology follow-up note   Subjective:    Patient ID: Calvin Shields, male    DOB: [birth year], PCP Caryl Bis, MD   Past Medical History:  Diagnosis Date   Diabetes mellitus without complication [facility])    Hyperlipidemia    Hypertension    Past Surgical History:  Procedure Laterality Date   AMPUTATION Left 09/08/2017   Procedure: PARTIAL TOE AMPUTATION LEFT HALLUX;  Surgeon: Tyson Babinski, DPM;  Location: [facility];  Service: Podiatry;  Laterality: Left;   right leg surgery       History reviewed. No pertinent family history.  Social History   Socioeconomic History   Marital status: Married    Spouse name: Not on file   Number of children: Not on file   Years of education: Not on file   Highest education level: Not on file  Occupational History   Not on file  Tobacco Use   Smoking status: Never   Smokeless tobacco: Never  Vaping Use   Vaping Use: Never used  Substance and Sexual Activity   Alcohol use: No   Drug use: No   Sexual activity: Not on file  Other Topics Concern   Not on file  Social History Narrative   Not on file   Social Determinants of Health   Financial Resource Strain: Not on file  Food Insecurity: Not on file  Transportation Needs: Not on file  Physical Activity: Not on file  Stress: Not on file  Social Connections: Not on file   Outpatient Encounter Medications as of 10/01/2021  Medication Sig   ACCU-CHEK FASTCLIX LANCETS MISC Use to test blood glucose 2 times a day.   acetaminophen-codeine (TYLENOL #3) 300-30 MG tablet    allopurinol (ZYLOPRIM) 100 MG tablet Take 100 mg by mouth daily.   amoxicillin (AMOXIL) 500 MG capsule  (Patient not taking: Reported on 10/01/2021)   aspirin EC 81 MG tablet Take 81 mg by mouth daily. (Patient not taking: Reported on 10/01/2021)   Blood Glucose Monitoring Suppl (ACCU-CHEK GUIDE) w/Device KIT    cadexomer iodine (IODOSORB) 0.9 % gel Apply to left 3rd toe once daily.    cephALEXin (KEFLEX) 500 MG capsule Take 1 capsule (500 mg total) by mouth 3 (three) times daily. (Patient not taking: Reported on 10/01/2021)   cephALEXin (KEFLEX) 500 MG capsule Take 1 capsule (500 mg total) by mouth 3 (three) times daily. (Patient not taking: Reported on 10/01/2021)   ciclopirox (PENLAC) 8 % solution  (Patient not taking: Reported on 10/01/2021)   colchicine 0.6 MG tablet Take 0.6 mg by mouth 2 (two) times daily.   CONTOUR NEXT TEST test strip USE TO TEST BLOOD SUGAR TWICE DAILY AS DIRECTED.   diclofenac Sodium (VOLTAREN) 1 % GEL Apply topically.   Dulaglutide (TRULICITY [place]) Inject 1.63 mg into the skin once a week.   gabapentin (NEURONTIN) 300 MG capsule at bedtime.   GLOBAL EASE INJECT PEN NEEDLES 31G X 8 MM MISC USE AS DIRECTED.   glucose blood (ACCU-CHEK GUIDE) test strip Use as instructed bid   HYDROcodone-acetaminophen (NORCO/VICODIN) 5-325 MG tablet  (Patient not taking: Reported on 10/01/2021)   ibuprofen (ADVIL,MOTRIN) 800 MG tablet Take 800 mg by mouth every 8 (eight) hours as needed. (Patient not taking: Reported on 10/01/2021)   lisinopril-hydrochlorothiazide (PRINZIDE,ZESTORETIC) 20-25 MG per tablet Take 1 tablet by mouth daily.   metFORMIN (GLUCOPHAGE-XR) 500 MG 24 hr tablet 500 mg daily with breakfast.   Multiple Vitamin (MULTIVITAMIN WITH MINERALS) TABS  tablet Take 1 tablet by mouth daily.   mupirocin ointment (BACTROBAN) 2 % Apply to right 2nd toe once daily.   pantoprazole (PROTONIX) 40 MG tablet  (Patient not taking: Reported on 10/01/2021)   pravastatin (PRAVACHOL) 40 MG tablet TAKE 1 TABLET BY MOUTH ONCE DAILY.   sildenafil (REVATIO) 20 MG tablet Take by mouth. (Patient not taking: Reported on 10/01/2021)   tamsulosin (FLOMAX) 0.4 MG CAPS capsule  (Patient not taking: Reported on 10/01/2021)   terbinafine (LAMISIL) 250 MG tablet  (Patient not taking: Reported on 10/01/2021)   traMADol (ULTRAM) 50 MG tablet  (Patient not taking: Reported on 10/01/2021)   Wound  Cleanser LIQD Clean left 3rd toe wound daily.   [DISCONTINUED] TRESIBA FLEXTOUCH 200 UNIT/ML FlexTouch Pen Inject into the skin. (Patient not taking: Reported on 10/01/2021)   No facility-administered encounter medications on file as of 10/01/2021.   ALLERGIES: Allergies  Allergen Reactions   Codeine Nausea And Vomiting   Aspirin Other (See Comments)    Other reaction(s): Abdominal Pain, Other (See Comments)    Ibuprofen Other (See Comments)    Other reaction(s): Abdominal Pain, Other (See Comments)    VACCINATION STATUS: Immunization History  Administered Date(s) Administered   Influenza,inj,Quad PF,6+ Mos 10/13/2017, 10/06/2019    Diabetes He presents for his follow-up diabetic visit. He has type 2 diabetes mellitus. His disease course has been improving. There are no hypoglycemic associated symptoms. Pertinent negatives for hypoglycemia include no confusion, headaches, pallor or seizures. Pertinent negatives for diabetes include no chest pain, no fatigue, no polydipsia, no polyphagia, no polyuria and no weakness. There are no hypoglycemic complications. Symptoms are improving. Diabetic complications include peripheral neuropathy. Risk factors for coronary artery disease include diabetes mellitus, dyslipidemia, hypertension, male sex, obesity and sedentary lifestyle. He is compliant with treatment most of the time. His weight is fluctuating minimally. He is following a generally unhealthy diet. When asked about meal planning, he reported none. He has had a previous visit with a dietitian. He never participates in exercise. His home blood glucose trend is decreasing steadily. (He presents with controlled glycemic profile and point-of-care A1c of 5.8%.  He does not report or document hypoglycemia.) An ACE inhibitor/angiotensin II receptor blocker is being taken. Eye exam is current.  Hyperlipidemia This is a chronic problem. The current episode started more than 1 year ago. The problem is  uncontrolled. Exacerbating diseases include diabetes and obesity. Pertinent negatives include no chest pain, myalgias or shortness of breath. Current antihyperlipidemic treatment includes statins. Compliance problems include adherence to diet.  Risk factors for coronary artery disease include diabetes mellitus, dyslipidemia, hypertension, male sex, obesity and a sedentary lifestyle.  Hypertension This is a chronic problem. The current episode started more than 1 year ago. Pertinent negatives include no chest pain, headaches, neck pain, palpitations or shortness of breath. Risk factors for coronary artery disease include dyslipidemia, diabetes mellitus, obesity, male gender and sedentary lifestyle. Past treatments include ACE inhibitors.    Review of systems Limited as above.   Objective:    BP 128/74   Pulse 92   Ht 5' 5.5" (1.664 m)   BMI 41.00 kg/m   Wt Readings from Last 3 Encounters:  03/12/21 250 lb 3.2 oz (113.5 kg)  09/11/20 235 lb 3.2 oz (106.7 kg)  05/01/20 229 lb 9.6 oz (104.1 kg)        Recent Results (from the past 2160 hour(s))  HM DIABETES EYE EXAM     Status: None   Collection Time: 07/09/21  1:47 PM  Result Value Ref Range   HM Diabetic Eye Exam No Retinopathy No Retinopathy  HgB A1c     Status: None   Collection Time: 10/01/21  2:20 PM  Result Value Ref Range   Hemoglobin A1C     HbA1c POC (<> result, manual entry)     HbA1c, POC (prediabetic range)     HbA1c, POC (controlled diabetic range) 5.8 0.0 - 7.0 %   Lipid Panel     Component Value Date/Time   CHOL 152 04/02/2021 0000   TRIG 127 04/02/2021 0000   HDL 45 04/02/2021 0000   CHOLHDL 4.1 10/06/2018 1310   VLDL 29 04/15/2016 0836   LDLCALC 84 04/02/2021 0000   LDLCALC 114 (H) 10/06/2018 1310    Assessment & Plan:   1. Diabetes mellitus without complication ([place])  - His diabetes is  complicated by peripheral neuropathy , peripheral arterial disease, Charcot's deformities and patient remains at  a high risk for more acute and chronic complications of diabetes which include CAD, CVA, CKD, retinopathy, and neuropathy. These are all discussed in detail with the patient.  He presents with controlled glycemic profile and point-of-care A1c of 5.8%.  He does not report or document hypoglycemia.  - Recent labs reviewed with him, showing normal renal function.  - I have re-counseled the patient on diet management and  and weight loss by adopting a carbohydrate restricted / protein rich  Diet.  -He has gained weight, admits to dietary indiscretion.  - he acknowledges that there is a room for improvement in his food and drink choices. - Suggestion is made for him to avoid simple carbohydrates  from his diet including Cakes, Sweet Desserts, Ice Cream, Soda (diet and regular), Sweet Tea, Candies, Chips, Cookies, Store Bought Juices, Alcohol in Excess of  1-2 drinks a day, Artificial Sweeteners,  Coffee Creamer, and "Sugar-free" Products, Lemonade. This will help patient to have more stable blood glucose profile and potentially avoid unintended weight gain.  - Patient is advised to stick to a routine mealtimes to eat 3 meals  a day and avoid unnecessary snacks ( to snack only to correct hypoglycemia).  - I have approached patient with the following individualized plan to manage diabetes and patient agrees.  -In light of his presentation with A1c of 5.8%,  he will not need any tighter glycemic profile.  He is advised to continue metformin 500 mg XR p.o. daily at breakfast, Trulicity 8.56 mg subcutaneously weekly.    He will not need insulin nor glipizide at this time.    - Patient specific target  for A1c; LDL, HDL, Triglycerides, and  Waist Circumference were discussed in detail.  2) BP/HTN:  -His blood pressure is controlled to target.   He is advised to continue his current blood pressure medications including lisinopril-hydrochlorthiazide 20-25 mg p.o. daily.    3) Lipids/HPL: His recent  lipid panel revealed uncontrolled LDL at 114.  He is advised to continue pravastatin 40 mg p.o. nightly.   Side effects and precautions discussed with him.   4)  Weight/Diet: His BMI is 34.24-a candidate for modest weight loss.  CDE consult in progress, exercise, and carbohydrates information provided.  5) Chronic Care/Health Maintenance:  -Patient is on ACEI/ARB and Statin medications and encouraged to continue to follow up with Ophthalmology, Podiatrist at least yearly or according to recommendations, and advised to  stay away from smoking. I have recommended yearly flu vaccine and pneumonia vaccination at least every 5 years; moderate  intensity exercise for up to 150 minutes weekly; and  sleep for at least 7 hours a day.  History screening ABI was negative for PAD in March 2022.  His study will be repeated in March 2027, or sooner if needed.   - I advised patient to maintain close follow up with Caryl Bis, MD for primary care needs.    I spent 32 minutes in the care of the patient today including review of labs from [place], Lipids, Thyroid Function, Hematology (current and previous including abstractions from other facilities); face-to-face time discussing  his blood glucose readings/logs, discussing hypoglycemia and hyperglycemia episodes and symptoms, medications doses, his options of short and long term treatment based on the latest standards of care / guidelines;  discussion about incorporating lifestyle medicine;  and documenting the encounter.    Please refer to Patient Instructions for Blood Glucose Monitoring and Insulin/Medications Dosing Guide"  in media tab for additional information. Please  also refer to " Patient Self Inventory" in the Media  tab for reviewed elements of pertinent patient history.  [facility] participated in the discussions, expressed understanding, and voiced agreement with the above plans.  All questions were answered to his satisfaction. he is  encouraged to contact clinic should he have any questions or concerns prior to his return visit.  Follow up plan: -Return in about 6 months (around 04/01/2022) for F/U with Pre-visit Labs, A1c -NV.  Glade Lloyd, MD Phone: [phone]  Fax: [phone]  -  This note was partially dictated with voice recognition software. Similar sounding words can be transcribed inadequately or may not  be corrected upon review.  10/01/2021, 3:40 PM

## 2021-10-08 ENCOUNTER — Ambulatory Visit (INDEPENDENT_AMBULATORY_CARE_PROVIDER_SITE_OTHER): Payer: Medicare Other | Admitting: Podiatry

## 2021-10-08 ENCOUNTER — Other Ambulatory Visit: Payer: Self-pay

## 2021-10-08 ENCOUNTER — Encounter: Payer: Self-pay | Admitting: Podiatry

## 2021-10-08 DIAGNOSIS — Z89412 Acquired absence of left great toe: Secondary | ICD-10-CM | POA: Diagnosis not present

## 2021-10-08 DIAGNOSIS — M79675 Pain in left toe(s): Secondary | ICD-10-CM

## 2021-10-08 DIAGNOSIS — B351 Tinea unguium: Secondary | ICD-10-CM

## 2021-10-08 DIAGNOSIS — E1142 Type 2 diabetes mellitus with diabetic polyneuropathy: Secondary | ICD-10-CM

## 2021-10-08 DIAGNOSIS — Q828 Other specified congenital malformations of skin: Secondary | ICD-10-CM | POA: Diagnosis not present

## 2021-10-08 DIAGNOSIS — M79674 Pain in right toe(s): Secondary | ICD-10-CM

## 2021-10-10 ENCOUNTER — Other Ambulatory Visit: Payer: Self-pay | Admitting: "Endocrinology

## 2021-10-13 NOTE — Progress Notes (Signed)
  Subjective:  Patient ID: Calvin Shields, male    DOB: 05/04/1956,  MRN: [id number]  65 y.o. male presents with at risk foot care. 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    Patient's blood sugar was 92 mg/dl today.  On last visit, he was diagnosed with ulceration of left 3rd digit which has healed.  He relates painful callus of right heel as well as interdigital lesion of the left 5th toe.  Calvin Shields voices no other pedal issues.  PCP: Richardean Chimera, MD and last visit was: 06/21/2021  Review of Systems: Negative except as noted in the HPI.   Allergies  Allergen Reactions   Codeine Nausea And Vomiting   Aspirin Other (See Comments)    Other reaction(s): Abdominal Pain, Other (See Comments)    Ibuprofen Other (See Comments)    Other reaction(s): Abdominal Pain, Other (See Comments)     Objective:  There were no vitals filed for this visit. Constitutional Patient is a pleasant 65 y.o. African American male morbidly obese in NAD. AAO x 3.  Vascular Capillary fill time to digits <3 seconds b/l lower extremities. Palpable DP pulse(s) b/l lower extremities Faintly palpable PT pulse(s) b/l lower extremities. Pedal hair absent. Lower extremity skin temperature gradient within normal limits. No pain with calf compression b/l. Trace edema noted b/l lower extremities. No cyanosis or clubbing noted.  Neurologic Normal speech. Protective sensation diminished with 10g monofilament b/l. Vibratory sensation diminished b/l.    Dermatologic No interdigital macerations b/l lower extremities. Toenails 2-5 bilaterally and R hallux elongated, discolored, dystrophic, thickened, and crumbly with subungual debris and tenderness to dorsal palpation. Porokeratotic lesion(s) interdigitally medial aspect 5th digit left foot, L 3rd toe, and posterior aspect of heel right foot. No erythema, no edema, no drainage, no fluctuance.   Orthopedic: Normal muscle strength 5/5 to all lower  extremity muscle groups bilaterally. Clawtoe deformity lesser digits b/l. Lower extremity amputation(s): partial amputation of L hallux.   Hemoglobin A1C Latest Ref Rng & Units 10/01/2021 06/18/2021 12/18/2020  HGBA1C 0.0 - 7.0 % 5.8 6.0 6.2  Some recent data might be hidden   Assessment:   1. Pain due to onychomycosis of toenails of both feet   2. Porokeratosis   3. Status post amputation of great toe, left (HCC)   4. Diabetic peripheral neuropathy associated with type 2 diabetes mellitus ([facility])    Plan:  Plan: -Patient was evaluated and treated and all questions answered.  -Patient/POA/Family member educated on diagnosis and treatment plan of routine ulcer debridement/wound care.  -Ulceration left 3rd digit has healed. -Examined patient. -Continue diabetic foot care principles: inspect feet daily, monitor glucose as recommended by PCP and/or Endocrinologist, and follow prescribed diet per PCP, Endocrinologist and/or dietician. -Toenails 2-5 bilaterally and R hallux debrided in length and girth without iatrogenic bleeding with sterile nail nipper and dremel.  -Painful porokeratotic lesion(s) L 3rd toe, L 5th toe, and posterior aspect of heel right foot pared and enucleated with sterile scalpel blade without incident. Total number of lesions debrided=3. -Patient to report any pedal injuries to medical professional immediately. -Patient/POA to call should there be question/concern in the interim.  Return in about 3 months (around 01/08/2022).  Freddie Breech, DPM

## 2021-10-23 DIAGNOSIS — Z9049 Acquired absence of other specified parts of digestive tract: Secondary | ICD-10-CM | POA: Insufficient documentation

## 2021-10-25 DIAGNOSIS — R109 Unspecified abdominal pain: Secondary | ICD-10-CM | POA: Diagnosis not present

## 2021-10-29 DIAGNOSIS — E119 Type 2 diabetes mellitus without complications: Secondary | ICD-10-CM | POA: Diagnosis not present

## 2021-10-29 DIAGNOSIS — M1711 Unilateral primary osteoarthritis, right knee: Secondary | ICD-10-CM | POA: Diagnosis not present

## 2021-10-29 DIAGNOSIS — R300 Dysuria: Secondary | ICD-10-CM | POA: Diagnosis not present

## 2021-10-29 DIAGNOSIS — M2559 Pain in other specified joint: Secondary | ICD-10-CM | POA: Diagnosis not present

## 2021-10-31 DIAGNOSIS — E119 Type 2 diabetes mellitus without complications: Secondary | ICD-10-CM | POA: Diagnosis not present

## 2021-10-31 DIAGNOSIS — M1611 Unilateral primary osteoarthritis, right hip: Secondary | ICD-10-CM | POA: Diagnosis not present

## 2021-10-31 DIAGNOSIS — M25461 Effusion, right knee: Secondary | ICD-10-CM | POA: Diagnosis not present

## 2021-10-31 DIAGNOSIS — R300 Dysuria: Secondary | ICD-10-CM | POA: Diagnosis not present

## 2021-10-31 DIAGNOSIS — Z471 Aftercare following joint replacement surgery: Secondary | ICD-10-CM | POA: Diagnosis not present

## 2021-10-31 DIAGNOSIS — M1711 Unilateral primary osteoarthritis, right knee: Secondary | ICD-10-CM | POA: Diagnosis not present

## 2021-10-31 DIAGNOSIS — M23221 Derangement of posterior horn of medial meniscus due to old tear or injury, right knee: Secondary | ICD-10-CM | POA: Diagnosis not present

## 2021-10-31 DIAGNOSIS — M7121 Synovial cyst of popliteal space [Baker], right knee: Secondary | ICD-10-CM | POA: Diagnosis not present

## 2021-10-31 DIAGNOSIS — Z01818 Encounter for other preprocedural examination: Secondary | ICD-10-CM | POA: Diagnosis not present

## 2021-10-31 DIAGNOSIS — Z01812 Encounter for preprocedural laboratory examination: Secondary | ICD-10-CM | POA: Diagnosis not present

## 2021-11-05 ENCOUNTER — Ambulatory Visit: Payer: Medicare Other | Admitting: Podiatry

## 2021-11-05 DIAGNOSIS — M1611 Unilateral primary osteoarthritis, right hip: Secondary | ICD-10-CM | POA: Diagnosis not present

## 2021-11-05 DIAGNOSIS — M1711 Unilateral primary osteoarthritis, right knee: Secondary | ICD-10-CM | POA: Diagnosis not present

## 2021-11-05 DIAGNOSIS — Z01818 Encounter for other preprocedural examination: Secondary | ICD-10-CM | POA: Diagnosis not present

## 2021-11-05 DIAGNOSIS — M2559 Pain in other specified joint: Secondary | ICD-10-CM | POA: Diagnosis not present

## 2021-11-28 DIAGNOSIS — E1142 Type 2 diabetes mellitus with diabetic polyneuropathy: Secondary | ICD-10-CM | POA: Diagnosis not present

## 2021-11-28 DIAGNOSIS — I10 Essential (primary) hypertension: Secondary | ICD-10-CM | POA: Diagnosis not present

## 2021-11-28 DIAGNOSIS — E782 Mixed hyperlipidemia: Secondary | ICD-10-CM | POA: Diagnosis not present

## 2021-11-30 DIAGNOSIS — M1711 Unilateral primary osteoarthritis, right knee: Secondary | ICD-10-CM | POA: Diagnosis not present

## 2021-12-05 DIAGNOSIS — R1011 Right upper quadrant pain: Secondary | ICD-10-CM | POA: Diagnosis not present

## 2021-12-05 DIAGNOSIS — Z7984 Long term (current) use of oral hypoglycemic drugs: Secondary | ICD-10-CM | POA: Diagnosis not present

## 2021-12-05 DIAGNOSIS — Z886 Allergy status to analgesic agent status: Secondary | ICD-10-CM | POA: Diagnosis not present

## 2021-12-05 DIAGNOSIS — G8918 Other acute postprocedural pain: Secondary | ICD-10-CM | POA: Diagnosis not present

## 2021-12-05 DIAGNOSIS — M170 Bilateral primary osteoarthritis of knee: Secondary | ICD-10-CM | POA: Diagnosis not present

## 2021-12-05 DIAGNOSIS — Z89422 Acquired absence of other left toe(s): Secondary | ICD-10-CM | POA: Diagnosis not present

## 2021-12-05 DIAGNOSIS — Z471 Aftercare following joint replacement surgery: Secondary | ICD-10-CM | POA: Diagnosis not present

## 2021-12-05 DIAGNOSIS — M1711 Unilateral primary osteoarthritis, right knee: Secondary | ICD-10-CM | POA: Diagnosis not present

## 2021-12-05 DIAGNOSIS — E876 Hypokalemia: Secondary | ICD-10-CM | POA: Diagnosis not present

## 2021-12-05 DIAGNOSIS — K219 Gastro-esophageal reflux disease without esophagitis: Secondary | ICD-10-CM | POA: Diagnosis not present

## 2021-12-05 DIAGNOSIS — I10 Essential (primary) hypertension: Secondary | ICD-10-CM | POA: Diagnosis not present

## 2021-12-05 DIAGNOSIS — Z888 Allergy status to other drugs, medicaments and biological substances status: Secondary | ICD-10-CM | POA: Diagnosis not present

## 2021-12-05 DIAGNOSIS — Z79899 Other long term (current) drug therapy: Secondary | ICD-10-CM | POA: Diagnosis not present

## 2021-12-05 DIAGNOSIS — M109 Gout, unspecified: Secondary | ICD-10-CM | POA: Diagnosis not present

## 2021-12-05 DIAGNOSIS — Z96651 Presence of right artificial knee joint: Secondary | ICD-10-CM | POA: Diagnosis not present

## 2021-12-05 DIAGNOSIS — D62 Acute posthemorrhagic anemia: Secondary | ICD-10-CM | POA: Diagnosis not present

## 2021-12-05 DIAGNOSIS — E785 Hyperlipidemia, unspecified: Secondary | ICD-10-CM | POA: Diagnosis not present

## 2021-12-05 DIAGNOSIS — K760 Fatty (change of) liver, not elsewhere classified: Secondary | ICD-10-CM | POA: Diagnosis not present

## 2021-12-05 DIAGNOSIS — Z885 Allergy status to narcotic agent status: Secondary | ICD-10-CM | POA: Diagnosis not present

## 2021-12-05 DIAGNOSIS — R509 Fever, unspecified: Secondary | ICD-10-CM | POA: Diagnosis not present

## 2021-12-05 DIAGNOSIS — M25561 Pain in right knee: Secondary | ICD-10-CM | POA: Diagnosis not present

## 2021-12-05 DIAGNOSIS — Z20822 Contact with and (suspected) exposure to covid-19: Secondary | ICD-10-CM | POA: Diagnosis not present

## 2021-12-05 DIAGNOSIS — Z9049 Acquired absence of other specified parts of digestive tract: Secondary | ICD-10-CM | POA: Diagnosis not present

## 2021-12-05 DIAGNOSIS — E871 Hypo-osmolality and hyponatremia: Secondary | ICD-10-CM | POA: Diagnosis not present

## 2021-12-05 DIAGNOSIS — R5082 Postprocedural fever: Secondary | ICD-10-CM | POA: Diagnosis not present

## 2021-12-05 DIAGNOSIS — Z7901 Long term (current) use of anticoagulants: Secondary | ICD-10-CM | POA: Diagnosis not present

## 2021-12-05 DIAGNOSIS — M7989 Other specified soft tissue disorders: Secondary | ICD-10-CM | POA: Diagnosis not present

## 2021-12-05 DIAGNOSIS — I700 Atherosclerosis of aorta: Secondary | ICD-10-CM | POA: Diagnosis not present

## 2021-12-05 DIAGNOSIS — E1122 Type 2 diabetes mellitus with diabetic chronic kidney disease: Secondary | ICD-10-CM | POA: Diagnosis not present

## 2021-12-05 DIAGNOSIS — E119 Type 2 diabetes mellitus without complications: Secondary | ICD-10-CM | POA: Diagnosis not present

## 2021-12-06 DIAGNOSIS — E1165 Type 2 diabetes mellitus with hyperglycemia: Secondary | ICD-10-CM | POA: Insufficient documentation

## 2021-12-06 DIAGNOSIS — Z96651 Presence of right artificial knee joint: Secondary | ICD-10-CM | POA: Insufficient documentation

## 2021-12-08 DIAGNOSIS — R5082 Postprocedural fever: Secondary | ICD-10-CM | POA: Insufficient documentation

## 2021-12-08 DIAGNOSIS — D62 Acute posthemorrhagic anemia: Secondary | ICD-10-CM | POA: Insufficient documentation

## 2021-12-11 DIAGNOSIS — M170 Bilateral primary osteoarthritis of knee: Secondary | ICD-10-CM | POA: Diagnosis not present

## 2021-12-11 DIAGNOSIS — Z981 Arthrodesis status: Secondary | ICD-10-CM | POA: Diagnosis not present

## 2021-12-12 DIAGNOSIS — Z96651 Presence of right artificial knee joint: Secondary | ICD-10-CM | POA: Diagnosis not present

## 2021-12-12 DIAGNOSIS — M1711 Unilateral primary osteoarthritis, right knee: Secondary | ICD-10-CM | POA: Diagnosis not present

## 2021-12-14 DIAGNOSIS — D62 Acute posthemorrhagic anemia: Secondary | ICD-10-CM | POA: Diagnosis not present

## 2021-12-14 DIAGNOSIS — M869 Osteomyelitis, unspecified: Secondary | ICD-10-CM | POA: Diagnosis not present

## 2021-12-14 DIAGNOSIS — Z7901 Long term (current) use of anticoagulants: Secondary | ICD-10-CM | POA: Diagnosis not present

## 2021-12-14 DIAGNOSIS — M109 Gout, unspecified: Secondary | ICD-10-CM | POA: Diagnosis not present

## 2021-12-14 DIAGNOSIS — M7989 Other specified soft tissue disorders: Secondary | ICD-10-CM | POA: Diagnosis not present

## 2021-12-14 DIAGNOSIS — M00861 Arthritis due to other bacteria, right knee: Secondary | ICD-10-CM | POA: Diagnosis not present

## 2021-12-14 DIAGNOSIS — E782 Mixed hyperlipidemia: Secondary | ICD-10-CM | POA: Diagnosis not present

## 2021-12-14 DIAGNOSIS — E1122 Type 2 diabetes mellitus with diabetic chronic kidney disease: Secondary | ICD-10-CM | POA: Diagnosis not present

## 2021-12-14 DIAGNOSIS — E1142 Type 2 diabetes mellitus with diabetic polyneuropathy: Secondary | ICD-10-CM | POA: Diagnosis not present

## 2021-12-14 DIAGNOSIS — S81001A Unspecified open wound, right knee, initial encounter: Secondary | ICD-10-CM | POA: Diagnosis not present

## 2021-12-14 DIAGNOSIS — R350 Frequency of micturition: Secondary | ICD-10-CM | POA: Diagnosis not present

## 2021-12-14 DIAGNOSIS — Z885 Allergy status to narcotic agent status: Secondary | ICD-10-CM | POA: Diagnosis not present

## 2021-12-14 DIAGNOSIS — K760 Fatty (change of) liver, not elsewhere classified: Secondary | ICD-10-CM | POA: Diagnosis not present

## 2021-12-14 DIAGNOSIS — E119 Type 2 diabetes mellitus without complications: Secondary | ICD-10-CM | POA: Diagnosis not present

## 2021-12-14 DIAGNOSIS — M25561 Pain in right knee: Secondary | ICD-10-CM | POA: Diagnosis not present

## 2021-12-14 DIAGNOSIS — Z794 Long term (current) use of insulin: Secondary | ICD-10-CM | POA: Diagnosis not present

## 2021-12-14 DIAGNOSIS — I10 Essential (primary) hypertension: Secondary | ICD-10-CM | POA: Diagnosis not present

## 2021-12-14 DIAGNOSIS — Z886 Allergy status to analgesic agent status: Secondary | ICD-10-CM | POA: Diagnosis not present

## 2021-12-14 DIAGNOSIS — T8453XA Infection and inflammatory reaction due to internal right knee prosthesis, initial encounter: Secondary | ICD-10-CM | POA: Diagnosis not present

## 2021-12-14 DIAGNOSIS — I700 Atherosclerosis of aorta: Secondary | ICD-10-CM | POA: Diagnosis not present

## 2021-12-14 DIAGNOSIS — Z96651 Presence of right artificial knee joint: Secondary | ICD-10-CM | POA: Diagnosis not present

## 2021-12-14 DIAGNOSIS — M009 Pyogenic arthritis, unspecified: Secondary | ICD-10-CM | POA: Diagnosis not present

## 2021-12-16 DIAGNOSIS — M009 Pyogenic arthritis, unspecified: Secondary | ICD-10-CM | POA: Insufficient documentation

## 2021-12-19 DIAGNOSIS — M009 Pyogenic arthritis, unspecified: Secondary | ICD-10-CM | POA: Diagnosis not present

## 2021-12-20 DIAGNOSIS — T8453XA Infection and inflammatory reaction due to internal right knee prosthesis, initial encounter: Secondary | ICD-10-CM | POA: Diagnosis not present

## 2021-12-20 DIAGNOSIS — M009 Pyogenic arthritis, unspecified: Secondary | ICD-10-CM | POA: Diagnosis not present

## 2021-12-21 DIAGNOSIS — M170 Bilateral primary osteoarthritis of knee: Secondary | ICD-10-CM | POA: Diagnosis not present

## 2021-12-21 DIAGNOSIS — Z981 Arthrodesis status: Secondary | ICD-10-CM | POA: Diagnosis not present

## 2021-12-25 DIAGNOSIS — T8453XA Infection and inflammatory reaction due to internal right knee prosthesis, initial encounter: Secondary | ICD-10-CM | POA: Diagnosis not present

## 2021-12-25 DIAGNOSIS — Z981 Arthrodesis status: Secondary | ICD-10-CM | POA: Diagnosis not present

## 2021-12-25 DIAGNOSIS — Z96651 Presence of right artificial knee joint: Secondary | ICD-10-CM | POA: Diagnosis not present

## 2021-12-25 DIAGNOSIS — M170 Bilateral primary osteoarthritis of knee: Secondary | ICD-10-CM | POA: Diagnosis not present

## 2021-12-25 DIAGNOSIS — M009 Pyogenic arthritis, unspecified: Secondary | ICD-10-CM | POA: Diagnosis not present

## 2021-12-27 ENCOUNTER — Ambulatory Visit (INDEPENDENT_AMBULATORY_CARE_PROVIDER_SITE_OTHER): Payer: Medicare Other | Admitting: Internal Medicine

## 2021-12-27 ENCOUNTER — Telehealth: Payer: Self-pay

## 2021-12-27 ENCOUNTER — Other Ambulatory Visit: Payer: Self-pay

## 2021-12-27 ENCOUNTER — Encounter: Payer: Self-pay | Admitting: Internal Medicine

## 2021-12-27 DIAGNOSIS — T8453XA Infection and inflammatory reaction due to internal right knee prosthesis, initial encounter: Secondary | ICD-10-CM | POA: Diagnosis not present

## 2021-12-27 NOTE — Telephone Encounter (Signed)
Left voicemail asking patient to return my call due to switching January appointment to a virtual visit. Also need to know patient Home Health company assisting with PICC line/IV meds as Dr.Comer is switching patient IV medications to Daptomycin and Ceftriaxone.    Javonta Gronau Lesli Albee, CMA

## 2021-12-27 NOTE — Progress Notes (Signed)
[facility]      Reason for Consult:prosthetic joint infection    Referring Physician: Dr. Emilia Beck    Patient ID: Calvin Shields, male    DOB: 1956-12-10, 65 y.o.   MRN: [id number]  HPI:   He has a history of a right prosthetic joint placement done on 12/05/21 by Dr. Emilia Beck.  On 12/14/21 on evaluation, he was noted to have cellulitis and drainage from the knee and underwent I and D with polyexchange on 12/15/21.  He was then started empirically on vancomycin and cefepime and discharged on 12/19/21.  He has continued on this since that time and here for his initial visit.  He feels well overall, no associated rash or diarrhea.  His labs are reviewed in Care Everywhere and there is no fluid culture noted.  There is a cell count with 14,000 WBCs in the joint and negative blood cultures.     Past Medical History:  Diagnosis Date   Diabetes mellitus without complication (HCC)    Hyperlipidemia    Hypertension     Prior to Admission medications   Medication Sig Start Date End Date Taking? Authorizing Provider  ACCU-CHEK FASTCLIX LANCETS MISC Use to test blood glucose 2 times a day. 11/05/17  Yes Nida, Denman George, MD  allopurinol (ZYLOPRIM) 100 MG tablet Take 100 mg by mouth daily.   Yes [provider]  aspirin EC 81 MG tablet Take 81 mg by mouth daily.   Yes [provider]  Blood Glucose Monitoring Suppl (ACCU-CHEK GUIDE) w/Device KIT  04/24/21  Yes [provider]  ceFEPIme (MAXIPIME) 2 g injection  12/27/21  Yes [provider]  CONTOUR NEXT TEST test strip USE TO TEST BLOOD SUGAR TWICE DAILY AS DIRECTED. 03/09/19  Yes Nida, Denman George, MD  cyclobenzaprine (FLEXERIL) 5 MG tablet Take by mouth. 12/10/21  Yes [provider]  docusate sodium (COLACE) 100 MG capsule Take 100 mg by mouth 2 (two) times daily. 12/10/21  Yes [provider]  Dulaglutide (TRULICITY [place]) Inject 0.75 mg into the skin once a  week.   Yes [provider]  ELIQUIS 2.5 MG TABS tablet Take 2.5 mg by mouth 2 (two) times daily. 12/10/21  Yes [provider]  famotidine (PEPCID) 20 MG tablet Take by mouth. 09/21/21  Yes [provider]  gabapentin (NEURONTIN) 300 MG capsule at bedtime. 04/24/20  Yes [provider]  GLOBAL EASE INJECT PEN NEEDLES 31G X 8 MM MISC USE AS DIRECTED. 09/22/19  Yes Nida, Denman George, MD  glucose blood (ACCU-CHEK GUIDE) test strip Use as instructed bid 11/07/17  Yes Nida, Denman George, MD  lisinopril-hydrochlorothiazide (PRINZIDE,ZESTORETIC) 20-25 MG per tablet Take 1 tablet by mouth daily.   Yes [provider]  metFORMIN (GLUCOPHAGE-XR) 500 MG 24 hr tablet 500 mg daily with breakfast. 11/02/20  Yes [provider]  Multiple Vitamin (MULTIVITAMIN WITH MINERALS) TABS tablet Take 1 tablet by mouth daily.   Yes [provider]  ondansetron (ZOFRAN) 8 MG tablet Take 8 mg by mouth 3 (three) times daily. 09/19/21  Yes [provider]  ondansetron (ZOFRAN-ODT) 4 MG disintegrating tablet Take 2 mg by mouth every 6 (six) hours as needed. 09/21/21  Yes [provider]  oxyCODONE (OXY IR/ROXICODONE) 5 MG immediate release tablet Take by mouth. 12/19/21  Yes [provider]  pantoprazole (PROTONIX) 40 MG tablet  08/31/20  Yes [provider]  pravastatin (PRAVACHOL) 40 MG tablet TAKE 1 TABLET BY  MOUTH ONCE DAILY. 02/21/20  Yes Nida, Marella Chimes, MD  vancomycin Zollie Pee) 10 G SOLR injection Inject into the vein. 12/19/21  Yes [provider]  acetaminophen-codeine (TYLENOL #3) 300-30 MG tablet  06/19/20   [provider]  cadexomer iodine (IODOSORB) 0.9 % gel Apply to left 3rd toe once daily. Patient not taking: Reported on 12/27/2021 07/16/21   Marzetta Board, DPM  ciclopirox [facility]) 8 % solution  05/09/20   [provider]  colchicine 0.6 MG tablet Take 0.6 mg by mouth 2 (two)  times daily. Patient not taking: Reported on 12/27/2021 06/16/21   [provider]  diclofenac Sodium (VOLTAREN) 1 % GEL Apply topically. Patient not taking: Reported on 12/27/2021 06/16/21   [provider]  glipiZIDE (GLUCOTROL XL) 5 MG 24 hr tablet Take 5 mg by mouth daily. Patient not taking: Reported on 12/27/2021 08/16/21   [provider]  HYDROcodone-acetaminophen (NORCO/VICODIN) 5-325 MG tablet  06/12/20   [provider]  ibuprofen (ADVIL,MOTRIN) 800 MG tablet Take 800 mg by mouth every 8 (eight) hours as needed. Patient not taking: Reported on 10/01/2021    [provider]  mupirocin ointment (BACTROBAN) 2 % Apply to right 2nd toe once daily. Patient not taking: Reported on 12/27/2021 12/25/20   Marzetta Board, DPM  pregabalin (LYRICA) 50 MG capsule Take 50 mg by mouth 2 (two) times daily. Patient not taking: Reported on 12/27/2021 12/10/21   [provider]  promethazine (PHENERGAN) 25 MG tablet Take 25 mg by mouth every 6 (six) hours as needed. Patient not taking: Reported on 12/27/2021 09/18/21   [provider]  sildenafil (REVATIO) 20 MG tablet Take by mouth. Patient not taking: Reported on 10/01/2021 07/04/21   [provider]  tamsulosin (FLOMAX) 0.4 MG CAPS capsule  04/24/20   [provider]  terbinafine (LAMISIL) 250 MG tablet  05/09/20   [provider]  traMADol Veatrice Bourbon) 50 MG tablet  05/09/20   [provider]  Wound Cleanser LIQD Clean left 3rd toe wound daily. Patient not taking: Reported on 12/27/2021 07/16/21   Marzetta Board, DPM    Allergies  Allergen Reactions   Codeine Nausea And Vomiting   Aspirin Other (See Comments)    Other reaction(s): Abdominal Pain, Other (See Comments)    Ibuprofen Other (See Comments)    Other reaction(s): Abdominal Pain, Other (See Comments)     Social History   Tobacco Use   Smoking status: Never   Smokeless tobacco: Never   Vaping Use   Vaping Use: Never used  Substance Use Topics   Alcohol use: No   Drug use: No   [place]: + cardiac disease  Review of Systems  Constitutional: negative for fevers and chills Gastrointestinal: negative for nausea and diarrhea Integument/breast: negative for rash All other systems reviewed and are negative    Constitutional: in no apparent distress  Vitals:   12/27/21 0926  BP: 139/82  Pulse: (!) 108  Temp: 99.6 F (37.6 C)  SpO2: 98%   EYES: anicteric Respiratory: normal respiratory effort Musculoskeletal: right knee with VAC in place, no surrounding erythyema Skin: no rash Neuro: non-focal  Labs: Lab Results  Component Value Date   WBC 10.8 (H) 09/07/2017   HGB 12.3 (L) 09/07/2017   HCT 38.4 (L) 09/07/2017   MCV 86.9 09/07/2017   PLT 649 (H) 09/07/2017    Lab Results  Component Value Date   CREATININE 1.2 04/02/2021   BUN 20 04/02/2021   NA  134 (A) 04/02/2021   K 3.9 04/02/2021   CL 95 (A) 04/02/2021   CO2 21 04/02/2021    Lab Results  Component Value Date   ALT 15 04/02/2021   AST 18 04/02/2021   ALKPHOS 107 04/02/2021   BILITOT 0.5 06/07/2019     Assessment: 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 Sheliah Plane on 12/15/21.  I discussed treatment choices, duration, outcomes and plan with IV antibiotics for 6 weeks followed by 3-6 months of oral therapy.  I also discussed that with negative cultures, this can be more difficult since we are unable to target an organism so will continue with broad antibiotics.   In review of his labs 12/27, his creat is elevated to 1.46 and platelets up to 1182.  I will change his vancomycin if possible to daptomycin (if covered by insurance) and can change cefepime to ceftriaxone for ease of dosing.  Thrombocytosis noted and not typically a reaction to antibiotics.  May be reactive or other.  Will continue to monitor platelets.    Diagnosis: Prosthetic joint infection, right.     Culture Result: negative  Allergies  Allergen Reactions   Codeine Nausea And Vomiting   Aspirin Other (See Comments)    Other reaction(s): Abdominal Pain, Other (See Comments)    Ibuprofen Other (See Comments)    Other reaction(s): Abdominal Pain, Other (See Comments)     OPAT Orders Discharge antibiotics to be given via PICC line Discharge antibiotics: daptomycin 8 mg/kg daily IV + ceftriaxone 2 grams IV daily Per pharmacy protocol yes Aim for Vancomycin trough 15-20 or AUC 400-550 (unless otherwise indicated) Duration: 4 additional weeks   End Date: 01/25/22  [facility] Care Per Protocol: yes  Home health RN for IV administration and teaching; PICC line care and labs.    Labs weekly while on IV antibiotics: _x_ CBC with differential __ BMP _x_ CMP _x_ CRP _x_ ESR __ Vancomycin trough __x CK  _x_ Please pull PIC at completion of IV antibiotics __ Please leave PIC in place until doctor has seen patient or been notified  Fax weekly labs to [phone]  Clinic Follow Up Appt: 3 weeks

## 2021-12-27 NOTE — Telephone Encounter (Signed)
Patient returned my call - appointment changed to virtual visit on 01/18/2022. Patient is unaware of home health company assisting with picc line/medications.   I contacted Dr.Stetler office and spoke with [place] - she is unable to see what home health company is managing picc line also due to Hospitalist ordering PICC line while patient was admitted. Patient has an appointment with Dr.Stetler this afternoon and will bring paperwork from hospital showing who is the home health company. [place] will call me back once she finds out.   Sailor Haughn Lesli Albee, CMA

## 2021-12-27 NOTE — Telephone Encounter (Signed)
[name] with [employer] called regarding patient IV medication change to daptomycin and ceftriaxone. [name] wanted to know if patient has had daptomycin in the past. I informed [name] patient has not had this in the past and will need first dose. Per [name] patient has [facility] and will get first dose of daptomycin in the home and he should be good to start ceftriaxone.  .[name] Calvin Shields

## 2021-12-28 DIAGNOSIS — E782 Mixed hyperlipidemia: Secondary | ICD-10-CM | POA: Diagnosis not present

## 2021-12-28 DIAGNOSIS — T8453XA Infection and inflammatory reaction due to internal right knee prosthesis, initial encounter: Secondary | ICD-10-CM | POA: Diagnosis not present

## 2021-12-28 DIAGNOSIS — Z981 Arthrodesis status: Secondary | ICD-10-CM | POA: Diagnosis not present

## 2021-12-28 DIAGNOSIS — M009 Pyogenic arthritis, unspecified: Secondary | ICD-10-CM | POA: Diagnosis not present

## 2021-12-28 DIAGNOSIS — E1142 Type 2 diabetes mellitus with diabetic polyneuropathy: Secondary | ICD-10-CM | POA: Diagnosis not present

## 2021-12-28 DIAGNOSIS — I10 Essential (primary) hypertension: Secondary | ICD-10-CM | POA: Diagnosis not present

## 2021-12-28 DIAGNOSIS — Z96651 Presence of right artificial knee joint: Secondary | ICD-10-CM | POA: Diagnosis not present

## 2021-12-28 DIAGNOSIS — M170 Bilateral primary osteoarthritis of knee: Secondary | ICD-10-CM | POA: Diagnosis not present

## 2021-12-29 DIAGNOSIS — M009 Pyogenic arthritis, unspecified: Secondary | ICD-10-CM | POA: Diagnosis not present

## 2021-12-29 DIAGNOSIS — T8453XA Infection and inflammatory reaction due to internal right knee prosthesis, initial encounter: Secondary | ICD-10-CM | POA: Diagnosis not present

## 2021-12-30 DIAGNOSIS — T8453XA Infection and inflammatory reaction due to internal right knee prosthesis, initial encounter: Secondary | ICD-10-CM | POA: Diagnosis not present

## 2021-12-30 DIAGNOSIS — M009 Pyogenic arthritis, unspecified: Secondary | ICD-10-CM | POA: Diagnosis not present

## 2021-12-31 DIAGNOSIS — E7849 Other hyperlipidemia: Secondary | ICD-10-CM | POA: Diagnosis not present

## 2021-12-31 DIAGNOSIS — D649 Anemia, unspecified: Secondary | ICD-10-CM | POA: Diagnosis not present

## 2021-12-31 DIAGNOSIS — I10 Essential (primary) hypertension: Secondary | ICD-10-CM | POA: Diagnosis not present

## 2021-12-31 DIAGNOSIS — D529 Folate deficiency anemia, unspecified: Secondary | ICD-10-CM | POA: Diagnosis not present

## 2021-12-31 DIAGNOSIS — E1142 Type 2 diabetes mellitus with diabetic polyneuropathy: Secondary | ICD-10-CM | POA: Diagnosis not present

## 2021-12-31 DIAGNOSIS — D519 Vitamin B12 deficiency anemia, unspecified: Secondary | ICD-10-CM | POA: Diagnosis not present

## 2021-12-31 DIAGNOSIS — N1831 Chronic kidney disease, stage 3a: Secondary | ICD-10-CM | POA: Diagnosis not present

## 2022-01-01 DIAGNOSIS — E782 Mixed hyperlipidemia: Secondary | ICD-10-CM | POA: Diagnosis not present

## 2022-01-01 DIAGNOSIS — M009 Pyogenic arthritis, unspecified: Secondary | ICD-10-CM | POA: Diagnosis not present

## 2022-01-01 DIAGNOSIS — N1831 Chronic kidney disease, stage 3a: Secondary | ICD-10-CM | POA: Diagnosis not present

## 2022-01-01 DIAGNOSIS — M19011 Primary osteoarthritis, right shoulder: Secondary | ICD-10-CM | POA: Diagnosis not present

## 2022-01-01 DIAGNOSIS — E1142 Type 2 diabetes mellitus with diabetic polyneuropathy: Secondary | ICD-10-CM | POA: Diagnosis not present

## 2022-01-01 DIAGNOSIS — D519 Vitamin B12 deficiency anemia, unspecified: Secondary | ICD-10-CM | POA: Diagnosis not present

## 2022-01-01 DIAGNOSIS — I10 Essential (primary) hypertension: Secondary | ICD-10-CM | POA: Diagnosis not present

## 2022-01-01 DIAGNOSIS — D649 Anemia, unspecified: Secondary | ICD-10-CM | POA: Diagnosis not present

## 2022-01-01 DIAGNOSIS — Z886 Allergy status to analgesic agent status: Secondary | ICD-10-CM | POA: Diagnosis not present

## 2022-01-01 DIAGNOSIS — Z885 Allergy status to narcotic agent status: Secondary | ICD-10-CM | POA: Diagnosis not present

## 2022-01-01 DIAGNOSIS — Z452 Encounter for adjustment and management of vascular access device: Secondary | ICD-10-CM | POA: Diagnosis not present

## 2022-01-01 DIAGNOSIS — D529 Folate deficiency anemia, unspecified: Secondary | ICD-10-CM | POA: Diagnosis not present

## 2022-01-01 LAB — CBC AND DIFFERENTIAL
HCT: 38 — AB (ref 41–53)
Hemoglobin: 12.1 — AB (ref 13.5–17.5)
Neutrophils Absolute: 4.6
Platelets: 783 — AB (ref 150–399)
WBC: 7.2

## 2022-01-01 LAB — LIPID PANEL
Cholesterol: 174 (ref 0–200)
HDL: 47 (ref 35–70)
LDL Cholesterol: 89
Triglycerides: 227 — AB (ref 40–160)

## 2022-01-01 LAB — IRON,TIBC AND FERRITIN PANEL
%SAT: 14
Ferritin: 226
Iron: 38
TIBC: 278
UIBC: 240

## 2022-01-01 LAB — COMPREHENSIVE METABOLIC PANEL
Albumin: 4.4 (ref 3.5–5.0)
Calcium: 10.7 (ref 8.7–10.7)

## 2022-01-01 LAB — CBC: RBC: 4.29 (ref 3.87–5.11)

## 2022-01-01 LAB — HEPATIC FUNCTION PANEL
ALT: 15 (ref 10–40)
AST: 19 (ref 14–40)
Alkaline Phosphatase: 158 — AB (ref 25–125)
Bilirubin, Total: 0.3

## 2022-01-01 LAB — BASIC METABOLIC PANEL
BUN: 11 (ref 4–21)
CO2: 23 — AB (ref 13–22)
Chloride: 94 — AB (ref 99–108)
Creatinine: 1.2 (ref <=1.3)
Glucose: 185
Potassium: 4.4 (ref 3.4–5.3)
Sodium: 139 (ref 137–147)

## 2022-01-01 LAB — VITAMIN B12: Vitamin B-12: 989

## 2022-01-02 DIAGNOSIS — M25661 Stiffness of right knee, not elsewhere classified: Secondary | ICD-10-CM | POA: Diagnosis not present

## 2022-01-02 DIAGNOSIS — Z981 Arthrodesis status: Secondary | ICD-10-CM | POA: Diagnosis not present

## 2022-01-02 DIAGNOSIS — M170 Bilateral primary osteoarthritis of knee: Secondary | ICD-10-CM | POA: Diagnosis not present

## 2022-01-02 DIAGNOSIS — M25561 Pain in right knee: Secondary | ICD-10-CM | POA: Diagnosis not present

## 2022-01-04 DIAGNOSIS — Z981 Arthrodesis status: Secondary | ICD-10-CM | POA: Diagnosis not present

## 2022-01-04 DIAGNOSIS — M25561 Pain in right knee: Secondary | ICD-10-CM | POA: Diagnosis not present

## 2022-01-04 DIAGNOSIS — M25661 Stiffness of right knee, not elsewhere classified: Secondary | ICD-10-CM | POA: Diagnosis not present

## 2022-01-04 DIAGNOSIS — M170 Bilateral primary osteoarthritis of knee: Secondary | ICD-10-CM | POA: Diagnosis not present

## 2022-01-05 DIAGNOSIS — T8453XA Infection and inflammatory reaction due to internal right knee prosthesis, initial encounter: Secondary | ICD-10-CM | POA: Diagnosis not present

## 2022-01-05 DIAGNOSIS — M009 Pyogenic arthritis, unspecified: Secondary | ICD-10-CM | POA: Diagnosis not present

## 2022-01-07 DIAGNOSIS — I10 Essential (primary) hypertension: Secondary | ICD-10-CM | POA: Diagnosis not present

## 2022-01-07 DIAGNOSIS — E1142 Type 2 diabetes mellitus with diabetic polyneuropathy: Secondary | ICD-10-CM | POA: Diagnosis not present

## 2022-01-07 DIAGNOSIS — E7849 Other hyperlipidemia: Secondary | ICD-10-CM | POA: Diagnosis not present

## 2022-01-07 DIAGNOSIS — N400 Enlarged prostate without lower urinary tract symptoms: Secondary | ICD-10-CM | POA: Diagnosis not present

## 2022-01-07 DIAGNOSIS — N1831 Chronic kidney disease, stage 3a: Secondary | ICD-10-CM | POA: Diagnosis not present

## 2022-01-07 DIAGNOSIS — M009 Pyogenic arthritis, unspecified: Secondary | ICD-10-CM | POA: Diagnosis not present

## 2022-01-07 DIAGNOSIS — M1712 Unilateral primary osteoarthritis, left knee: Secondary | ICD-10-CM | POA: Diagnosis not present

## 2022-01-07 DIAGNOSIS — Z6833 Body mass index (BMI) 33.0-33.9, adult: Secondary | ICD-10-CM | POA: Diagnosis not present

## 2022-01-07 DIAGNOSIS — T8453XA Infection and inflammatory reaction due to internal right knee prosthesis, initial encounter: Secondary | ICD-10-CM | POA: Diagnosis not present

## 2022-01-08 DIAGNOSIS — M170 Bilateral primary osteoarthritis of knee: Secondary | ICD-10-CM | POA: Diagnosis not present

## 2022-01-08 DIAGNOSIS — Z981 Arthrodesis status: Secondary | ICD-10-CM | POA: Diagnosis not present

## 2022-01-10 DIAGNOSIS — M170 Bilateral primary osteoarthritis of knee: Secondary | ICD-10-CM | POA: Diagnosis not present

## 2022-01-10 DIAGNOSIS — Z981 Arthrodesis status: Secondary | ICD-10-CM | POA: Diagnosis not present

## 2022-01-12 DIAGNOSIS — T8453XA Infection and inflammatory reaction due to internal right knee prosthesis, initial encounter: Secondary | ICD-10-CM | POA: Diagnosis not present

## 2022-01-12 DIAGNOSIS — M009 Pyogenic arthritis, unspecified: Secondary | ICD-10-CM | POA: Diagnosis not present

## 2022-01-14 DIAGNOSIS — T8453XA Infection and inflammatory reaction due to internal right knee prosthesis, initial encounter: Secondary | ICD-10-CM | POA: Diagnosis not present

## 2022-01-14 DIAGNOSIS — M009 Pyogenic arthritis, unspecified: Secondary | ICD-10-CM | POA: Diagnosis not present

## 2022-01-15 DIAGNOSIS — M170 Bilateral primary osteoarthritis of knee: Secondary | ICD-10-CM | POA: Diagnosis not present

## 2022-01-15 DIAGNOSIS — Z981 Arthrodesis status: Secondary | ICD-10-CM | POA: Diagnosis not present

## 2022-01-17 DIAGNOSIS — M170 Bilateral primary osteoarthritis of knee: Secondary | ICD-10-CM | POA: Diagnosis not present

## 2022-01-17 DIAGNOSIS — Z981 Arthrodesis status: Secondary | ICD-10-CM | POA: Diagnosis not present

## 2022-01-18 ENCOUNTER — Other Ambulatory Visit: Payer: Self-pay

## 2022-01-18 ENCOUNTER — Telehealth: Payer: Self-pay

## 2022-01-18 ENCOUNTER — Telehealth (INDEPENDENT_AMBULATORY_CARE_PROVIDER_SITE_OTHER): Payer: Medicare HMO | Admitting: Internal Medicine

## 2022-01-18 DIAGNOSIS — Z6832 Body mass index (BMI) 32.0-32.9, adult: Secondary | ICD-10-CM | POA: Diagnosis not present

## 2022-01-18 DIAGNOSIS — T8453XD Infection and inflammatory reaction due to internal right knee prosthesis, subsequent encounter: Secondary | ICD-10-CM

## 2022-01-18 DIAGNOSIS — M009 Pyogenic arthritis, unspecified: Secondary | ICD-10-CM | POA: Diagnosis not present

## 2022-01-18 MED ORDER — AMOXICILLIN-POT CLAVULANATE 875-125 MG PO TABS: 1.0000 | ORAL_TABLET | Freq: Two times a day (BID) | ORAL | 2 refills | Status: DC

## 2022-01-18 MED ORDER — DOXYCYCLINE HYCLATE 100 MG PO TABS: 100.0000 mg | ORAL_TABLET | Freq: Two times a day (BID) | ORAL | 2 refills | Status: DC

## 2022-01-18 NOTE — Telephone Encounter (Signed)
-----   Message from Gardiner Barefoot, MD sent at 01/18/2022  1:28 PM EST ----- He missed his appt with me today.  His CK level is elevated so I want him to stop daptomycin.  I would like to go ahead and stop the ceftriaxone as well and start oral doxycycline and Augmentin for 3 months.  If he could follow up with me in 2-3 weeks.  thanks

## 2022-01-18 NOTE — Progress Notes (Signed)
No show

## 2022-01-18 NOTE — Telephone Encounter (Signed)
Orders given to [facility] with [facility] to stop IV antibiotics and picc can be removed today. [name] verbalized understanding. Patient advised that he will be starting oral antibiotics doxycycline and Augmentin. I have confirmed with Mandy with [facility] that they are scheduled to pull patient's picc line today. Mabry Santarelli T Pricilla Loveless

## 2022-01-21 ENCOUNTER — Ambulatory Visit (INDEPENDENT_AMBULATORY_CARE_PROVIDER_SITE_OTHER): Payer: Medicare HMO | Admitting: Podiatry

## 2022-01-21 ENCOUNTER — Other Ambulatory Visit: Payer: Self-pay

## 2022-01-21 ENCOUNTER — Encounter: Payer: Self-pay | Admitting: Podiatry

## 2022-01-21 DIAGNOSIS — E1142 Type 2 diabetes mellitus with diabetic polyneuropathy: Secondary | ICD-10-CM

## 2022-01-21 DIAGNOSIS — L84 Corns and callosities: Secondary | ICD-10-CM

## 2022-01-21 DIAGNOSIS — Q828 Other specified congenital malformations of skin: Secondary | ICD-10-CM | POA: Diagnosis not present

## 2022-01-21 DIAGNOSIS — B351 Tinea unguium: Secondary | ICD-10-CM | POA: Diagnosis not present

## 2022-01-21 DIAGNOSIS — E119 Type 2 diabetes mellitus without complications: Secondary | ICD-10-CM

## 2022-01-21 DIAGNOSIS — Z8631 Personal history of diabetic foot ulcer: Secondary | ICD-10-CM

## 2022-01-21 NOTE — Progress Notes (Signed)
ANNUAL DIABETIC FOOT EXAM  Subjective: Calvin Shields presents today for for annual diabetic foot examination.  Patient relates several year h/o diabetes.  Patient currently has ulcer of right ankle, He states ulcer developed after he had knee replacement. He is seeing Dr. Renae Gloss for Wound Care in [place] for management of the ulcer.  Patient has been diagnosed with neuropathy and it is managed with gabapentin.  Risk factors: diabetes, neuropathy, history of amputation, foot deformity, hyperlipidemia, history of foot/leg ulcer, HTN.  Richardean Chimera, MD is patient's PCP. Last visit was 09/19/2021.  Past Medical History:  Diagnosis Date   Diabetes mellitus without complication (HCC)    Hyperlipidemia    Hypertension    Patient Active Problem List   Diagnosis Date Noted   Infection of prosthetic right knee joint (HCC) 12/27/2021   Septic arthritis of knee, right (HCC) 12/16/2021   Postoperative anemia due to acute blood loss 12/08/2021   Postsurgical fever 12/08/2021   Inadequately controlled diabetes mellitus (HCC) 12/06/2021   S/P total knee arthroplasty, right 12/06/2021   S/P laparoscopic cholecystectomy 10/23/2021   Hypokalemia 09/23/2021   Aortic atherosclerosis (HCC) 09/21/2021   Benign prostatic hyperplasia with urinary frequency 09/21/2021   Gastroesophageal reflux disease without esophagitis 09/21/2021   Hyponatremia 09/21/2021   Right upper quadrant abdominal pain 09/21/2021   Primary osteoarthritis of knees, bilateral 05/13/2018   Osteomyelitis (HCC) 09/03/2017   Diabetic ulcer of toe of left foot associated with type 2 diabetes mellitus, with fat layer exposed (HCC) 08/13/2017   Onychomycosis 05/24/2017   Morbid obesity due to excess calories (HCC) 04/23/2016   Type II diabetes mellitus, uncontrolled 01/16/2016   Mixed hyperlipidemia 01/16/2016   Essential hypertension, benign 01/16/2016   Blood loss anemia 01/29/2014   Diverticular disease 01/29/2014    Nonalcoholic fatty liver disease 01/29/2014   PNA (pneumonia) 01/26/2014   DM (diabetes mellitus), type 2, uncontrolled w/neurologic complication 01/25/2014   HTN (hypertension) 01/21/2014   Posterior tibial tendinitis, unspecified leg 01/21/2014   Status post ankle fusion 01/21/2014   TENDINITIS, LEFT KNEE 09/12/2009   Arthritis 06/14/2009   Knee pain 06/14/2009   Past Surgical History:  Procedure Laterality Date   AMPUTATION Left 09/08/2017   Procedure: PARTIAL TOE AMPUTATION LEFT HALLUX;  Surgeon: Erskine Emery, DPM;  Location: [facility];  Service: Podiatry;  Laterality: Left;   right leg surgery      Current Outpatient Medications on File Prior to Visit  Medication Sig Dispense Refill   ACCU-CHEK FASTCLIX LANCETS MISC Use to test blood glucose 2 times a day. 5 each 2   allopurinol (ZYLOPRIM) 100 MG tablet Take 100 mg by mouth daily.     amoxicillin-clavulanate (AUGMENTIN) 875-125 MG tablet Take 1 tablet by mouth 2 (two) times daily. 60 tablet 2   aspirin EC 81 MG tablet Take 81 mg by mouth daily.     Blood Glucose Monitoring Suppl (ACCU-CHEK GUIDE) w/Device KIT      cadexomer iodine (IODOSORB) 0.9 % gel Apply to left 3rd toe once daily. (Patient not taking: Reported on 12/27/2021) 40 g 0   CONTOUR NEXT TEST test strip USE TO TEST BLOOD SUGAR TWICE DAILY AS DIRECTED. 100 each 5   cyclobenzaprine (FLEXERIL) 5 MG tablet Take by mouth.     docusate sodium (COLACE) 100 MG capsule Take 100 mg by mouth 2 (two) times daily.     doxycycline (VIBRA-TABS) 100 MG tablet Take 1 tablet (100 mg total) by mouth 2 (two) times daily. 60 tablet 2  Dulaglutide (TRULICITY [place]) Inject 4.94 mg into the skin once a week.     ELIQUIS 2.5 MG TABS tablet Take 2.5 mg by mouth 2 (two) times daily.     famotidine (PEPCID) 20 MG tablet Take by mouth.     gabapentin (NEURONTIN) 300 MG capsule at bedtime.     GLOBAL EASE INJECT PEN NEEDLES 31G X 8 MM MISC USE AS DIRECTED. 60 each 0   glucose blood (ACCU-CHEK  GUIDE) test strip Use as instructed bid 100 each 5   lisinopril-hydrochlorothiazide (PRINZIDE,ZESTORETIC) 20-25 MG per tablet Take 1 tablet by mouth daily.     metFORMIN (GLUCOPHAGE-XR) 500 MG 24 hr tablet 500 mg daily with breakfast.     Multiple Vitamin (MULTIVITAMIN WITH MINERALS) TABS tablet Take 1 tablet by mouth daily.     ondansetron (ZOFRAN) 8 MG tablet Take 8 mg by mouth 3 (three) times daily.     ondansetron (ZOFRAN-ODT) 4 MG disintegrating tablet Take 2 mg by mouth every 6 (six) hours as needed.     oxyCODONE (OXY IR/ROXICODONE) 5 MG immediate release tablet Take by mouth.     pantoprazole (PROTONIX) 40 MG tablet      pravastatin (PRAVACHOL) 40 MG tablet TAKE 1 TABLET BY MOUTH ONCE DAILY. 90 tablet 1   Wound Cleanser LIQD Clean left 3rd toe wound daily. (Patient not taking: Reported on 12/27/2021) 473 mL 0   No current facility-administered medications on file prior to visit.    Allergies  Allergen Reactions   Codeine Nausea And Vomiting   Aspirin Other (See Comments)    Other reaction(s): Abdominal Pain, Other (See Comments)    Ibuprofen Other (See Comments)    Other reaction(s): Abdominal Pain, Other (See Comments)    Social History   Occupational History   Not on file  Tobacco Use   Smoking status: Never   Smokeless tobacco: Never  Vaping Use   Vaping Use: Never used  Substance and Sexual Activity   Alcohol use: No   Drug use: No   Sexual activity: Not on file   History reviewed. No pertinent family history. Immunization History  Administered Date(s) Administered   Influenza,inj,Quad PF,6+ Mos 10/13/2017, 10/06/2019     Review of Systems: Negative except as noted in the HPI.   Objective: There were no vitals filed for this visit.  Calvin Shields is a pleasant 66 y.o. male in NAD. AAO X 3.  Vascular Examination: CFT <3 seconds b/l LE. Palpable DP pulse(s) b/l LE. Faintly palpable PT pulse(s) b/l LE. Pedal hair absent. No pain with calf compression  b/l. Trace edema noted BLE. No ischemia or gangrene noted b/l LE. No cyanosis or clubbing noted b/l LE.  Dermatological Examination: No interdigital macerations noted b/l LE. Toenails 2-5 bilaterally and R hallux elongated, discolored, dystrophic, thickened, and crumbly with subungual debris and tenderness to dorsal palpation. Porokeratotic lesion(s) plantar heel pad of right foot. No erythema, no edema, no drainage, no fluctuance. Preulcerative lesion noted L 4th toe. There is visible subdermal hemorrhage. There is no surrounding erythema, no edema, no drainage, no odor, no fluctuance.  Musculoskeletal Examination: Muscle strength 5/5 to all lower extremity muscle groups bilaterally. Lower extremity amputation(s): partial amputation of L hallux.  Footwear Assessment: Does the patient wear appropriate shoes? Yes. Does the patient need inserts/orthotics? Yes.  Neurological Examination: Protective sensation diminished with 10g monofilament b/l. Vibratory sensation diminished b/l. Proprioception intact bilaterally.  Hemoglobin A1C Latest Ref Rng & Units 10/01/2021 06/18/2021  HGBA1C 0.0 - 7.0 %  5.8 6.0  Some recent data might be hidden   Assessment: 1. Onychomycosis   2. Pre-ulcerative corn or callous   3. Porokeratosis   4. History of diabetic ulcer of foot   5. Diabetic peripheral neuropathy associated with type 2 diabetes mellitus ([place])   6. Encounter for diabetic foot exam ([place])     ADA Risk Categorization: High Risk  Patient has one or more of the following: Loss of protective sensation Absent pedal pulses Severe Foot deformity History of foot ulcer  Plan: -Diabetic foot examination performed today. -Continue foot and shoe inspections daily. Monitor blood glucose per PCP/Endocrinologist's recommendations. -Mycotic toenails 2-5 bilaterally and R hallux were debrided in length and girth with sterile nail nippers and dremel without iatrogenic bleeding. -Preulcerative lesion pared  L 4th toe. Total number pared=1. -Painful porokeratotic lesion(s) plantar heel pad of right foot pared and enucleated with sterile scalpel blade without incident. Total number of lesions debrided=1. -Patient/POA to call should there be question/concern in the interim.  Return in about 3 months (around 04/21/2022).  Marzetta Board, DPM

## 2022-01-22 DIAGNOSIS — M170 Bilateral primary osteoarthritis of knee: Secondary | ICD-10-CM | POA: Diagnosis not present

## 2022-01-22 DIAGNOSIS — Z981 Arthrodesis status: Secondary | ICD-10-CM | POA: Diagnosis not present

## 2022-01-24 DIAGNOSIS — Z981 Arthrodesis status: Secondary | ICD-10-CM | POA: Diagnosis not present

## 2022-01-24 DIAGNOSIS — M170 Bilateral primary osteoarthritis of knee: Secondary | ICD-10-CM | POA: Diagnosis not present

## 2022-01-27 DIAGNOSIS — E1142 Type 2 diabetes mellitus with diabetic polyneuropathy: Secondary | ICD-10-CM | POA: Diagnosis not present

## 2022-01-27 DIAGNOSIS — I10 Essential (primary) hypertension: Secondary | ICD-10-CM | POA: Diagnosis not present

## 2022-01-29 DIAGNOSIS — M170 Bilateral primary osteoarthritis of knee: Secondary | ICD-10-CM | POA: Diagnosis not present

## 2022-01-29 DIAGNOSIS — Z981 Arthrodesis status: Secondary | ICD-10-CM | POA: Diagnosis not present

## 2022-01-31 DIAGNOSIS — Z981 Arthrodesis status: Secondary | ICD-10-CM | POA: Diagnosis not present

## 2022-01-31 DIAGNOSIS — M170 Bilateral primary osteoarthritis of knee: Secondary | ICD-10-CM | POA: Diagnosis not present

## 2022-01-31 DIAGNOSIS — Z96651 Presence of right artificial knee joint: Secondary | ICD-10-CM | POA: Diagnosis not present

## 2022-02-05 DIAGNOSIS — Z981 Arthrodesis status: Secondary | ICD-10-CM | POA: Diagnosis not present

## 2022-02-05 DIAGNOSIS — M170 Bilateral primary osteoarthritis of knee: Secondary | ICD-10-CM | POA: Diagnosis not present

## 2022-02-07 DIAGNOSIS — M170 Bilateral primary osteoarthritis of knee: Secondary | ICD-10-CM | POA: Diagnosis not present

## 2022-02-07 DIAGNOSIS — Z981 Arthrodesis status: Secondary | ICD-10-CM | POA: Diagnosis not present

## 2022-02-12 ENCOUNTER — Other Ambulatory Visit: Payer: Self-pay

## 2022-02-12 ENCOUNTER — Ambulatory Visit (INDEPENDENT_AMBULATORY_CARE_PROVIDER_SITE_OTHER): Payer: Medicare HMO | Admitting: Internal Medicine

## 2022-02-12 ENCOUNTER — Encounter: Payer: Self-pay | Admitting: Internal Medicine

## 2022-02-12 VITALS — BP 142/84 | HR 109 | Resp 16 | Ht 65.5 in | Wt 228.0 lb

## 2022-02-12 DIAGNOSIS — Z5181 Encounter for therapeutic drug level monitoring: Secondary | ICD-10-CM | POA: Diagnosis not present

## 2022-02-12 DIAGNOSIS — Z981 Arthrodesis status: Secondary | ICD-10-CM | POA: Diagnosis not present

## 2022-02-12 DIAGNOSIS — T8453XD Infection and inflammatory reaction due to internal right knee prosthesis, subsequent encounter: Secondary | ICD-10-CM | POA: Diagnosis not present

## 2022-02-12 DIAGNOSIS — I10 Essential (primary) hypertension: Secondary | ICD-10-CM | POA: Diagnosis not present

## 2022-02-12 DIAGNOSIS — M170 Bilateral primary osteoarthritis of knee: Secondary | ICD-10-CM | POA: Diagnosis not present

## 2022-02-12 NOTE — Assessment & Plan Note (Signed)
Will check his hepatic function panel on antibiotics today.

## 2022-02-12 NOTE — Assessment & Plan Note (Signed)
BP a little elevated.  He will continue to work with his PCP.

## 2022-02-12 NOTE — Progress Notes (Signed)
° °  Subjective:    Patient ID: Calvin Shields, male    DOB: 05/03/1956, 66 y.o.   MRN: [id number]  HPI He is here for follow up of a prosthetic joint infection of his right knee.  He went to the OR by Dr. Sheliah Plane and underwent I and D with polyexchange.  Cultures remained negative and he completed prolonged IV treatment and no on oral doxycycline and Augmentin for planned 3 - 6 months.  He started this about 01/18/22.  He is concerned about some fluid on his knee.     Review of Systems  Constitutional:  Negative for fatigue and fever.  Gastrointestinal:  Negative for diarrhea and nausea.  Skin:  Negative for rash.      Objective:   Physical Exam Eyes:     General: No scleral icterus. Pulmonary:     Effort: Pulmonary effort is normal.  Musculoskeletal:     Comments: Right knee with some effusion, minimal warmth  Skin:    Findings: No rash.  Neurological:     Mental Status: He is alert.   SH: no tobacco       Assessment & Plan:

## 2022-02-12 NOTE — Assessment & Plan Note (Addendum)
He continues to do well post I and D with polyexchange.  Inflammatory markers have remained a bit elevated but clinically seems to be doing well.  I will recheck his inflammatory markers today and then have him continue with oral doxyxycline and Augmentin another 2 months and reassess.  May consider stopping at that time if he clinically is stable.   rtc in 2 months.  He has refills for his medications.   He is concerned about fluid on his knee and wants to inform Dr. Sheliah Plane.  I told him my note will be forwarded but I don't think he necessarily needs fluid drawn off his knee but will defer to Dr. Sheliah Plane.

## 2022-02-13 LAB — HEPATIC FUNCTION PANEL
AG Ratio: 1.2 (calc) (ref 1.0–2.5)
ALT: 10 U/L (ref 9–46)
AST: 13 U/L (ref 10–35)
Albumin: 4.2 g/dL (ref 3.6–5.1)
Alkaline phosphatase (APISO): 115 U/L (ref 35–144)
Bilirubin, Direct: 0.1 mg/dL (ref 0.0–0.2)
Globulin: 3.5 g/dL (calc) (ref 1.9–3.7)
Indirect Bilirubin: 0.3 mg/dL (calc) (ref 0.2–1.2)
Total Bilirubin: 0.4 mg/dL (ref 0.2–1.2)
Total Protein: 7.7 g/dL (ref 6.1–8.1)

## 2022-02-13 LAB — C-REACTIVE PROTEIN: CRP: 45.3 mg/L — ABNORMAL HIGH (ref <8.0)

## 2022-02-13 LAB — SEDIMENTATION RATE: Sed Rate: 36 mm/h — ABNORMAL HIGH (ref 0–20)

## 2022-02-14 DIAGNOSIS — Z981 Arthrodesis status: Secondary | ICD-10-CM | POA: Diagnosis not present

## 2022-02-14 DIAGNOSIS — M170 Bilateral primary osteoarthritis of knee: Secondary | ICD-10-CM | POA: Diagnosis not present

## 2022-02-19 DIAGNOSIS — M170 Bilateral primary osteoarthritis of knee: Secondary | ICD-10-CM | POA: Diagnosis not present

## 2022-02-19 DIAGNOSIS — Z981 Arthrodesis status: Secondary | ICD-10-CM | POA: Diagnosis not present

## 2022-02-21 DIAGNOSIS — M170 Bilateral primary osteoarthritis of knee: Secondary | ICD-10-CM | POA: Diagnosis not present

## 2022-02-21 DIAGNOSIS — Z981 Arthrodesis status: Secondary | ICD-10-CM | POA: Diagnosis not present

## 2022-02-26 DIAGNOSIS — E1142 Type 2 diabetes mellitus with diabetic polyneuropathy: Secondary | ICD-10-CM | POA: Diagnosis not present

## 2022-02-26 DIAGNOSIS — I10 Essential (primary) hypertension: Secondary | ICD-10-CM | POA: Diagnosis not present

## 2022-02-26 DIAGNOSIS — Z981 Arthrodesis status: Secondary | ICD-10-CM | POA: Diagnosis not present

## 2022-02-26 DIAGNOSIS — E782 Mixed hyperlipidemia: Secondary | ICD-10-CM | POA: Diagnosis not present

## 2022-02-26 DIAGNOSIS — M170 Bilateral primary osteoarthritis of knee: Secondary | ICD-10-CM | POA: Diagnosis not present

## 2022-02-28 DIAGNOSIS — Z96651 Presence of right artificial knee joint: Secondary | ICD-10-CM | POA: Diagnosis not present

## 2022-02-28 DIAGNOSIS — Z981 Arthrodesis status: Secondary | ICD-10-CM | POA: Diagnosis not present

## 2022-02-28 DIAGNOSIS — M170 Bilateral primary osteoarthritis of knee: Secondary | ICD-10-CM | POA: Diagnosis not present

## 2022-03-05 DIAGNOSIS — M170 Bilateral primary osteoarthritis of knee: Secondary | ICD-10-CM | POA: Diagnosis not present

## 2022-03-05 DIAGNOSIS — Z981 Arthrodesis status: Secondary | ICD-10-CM | POA: Diagnosis not present

## 2022-03-07 DIAGNOSIS — M170 Bilateral primary osteoarthritis of knee: Secondary | ICD-10-CM | POA: Diagnosis not present

## 2022-03-07 DIAGNOSIS — Z981 Arthrodesis status: Secondary | ICD-10-CM | POA: Diagnosis not present

## 2022-03-25 ENCOUNTER — Telehealth: Payer: Self-pay | Admitting: "Endocrinology

## 2022-03-25 NOTE — Telephone Encounter (Signed)
Error

## 2022-04-01 ENCOUNTER — Ambulatory Visit: Payer: Medicare HMO | Admitting: "Endocrinology

## 2022-04-01 ENCOUNTER — Encounter: Payer: Self-pay | Admitting: "Endocrinology

## 2022-04-01 VITALS — BP 136/78 | HR 76 | Ht 65.5 in | Wt 240.8 lb

## 2022-04-01 DIAGNOSIS — Z6839 Body mass index (BMI) 39.0-39.9, adult: Secondary | ICD-10-CM | POA: Diagnosis not present

## 2022-04-01 DIAGNOSIS — E1165 Type 2 diabetes mellitus with hyperglycemia: Secondary | ICD-10-CM

## 2022-04-01 DIAGNOSIS — I10 Essential (primary) hypertension: Secondary | ICD-10-CM | POA: Diagnosis not present

## 2022-04-01 DIAGNOSIS — E782 Mixed hyperlipidemia: Secondary | ICD-10-CM

## 2022-04-01 LAB — POCT GLYCOSYLATED HEMOGLOBIN (HGB A1C): HbA1c, POC (controlled diabetic range): 6.4 % (ref 0.0–7.0)

## 2022-04-01 MED ORDER — TRULICITY 1.5 MG/0.5ML ~~LOC~~ SOAJ: 1.5000 mg | SUBCUTANEOUS | 3 refills | Status: DC

## 2022-04-01 NOTE — Patient Instructions (Signed)

## 2022-04-01 NOTE — Progress Notes (Signed)
? ?04/01/2022 ? ? ?Endocrinology follow-up note ? ? ?Subjective:  ? ? Patient ID: Calvin Shields, male    DOB: [birth year], PCP Caryl Bis, MD ? ? ?Past Medical History:  ?Diagnosis Date  ? Diabetes mellitus without complication ([place])   ? Hyperlipidemia   ? Hypertension   ? ?Past Surgical History:  ?Procedure Laterality Date  ? AMPUTATION Left 09/08/2017  ? Procedure: PARTIAL TOE AMPUTATION LEFT HALLUX;  Surgeon: Tyson Babinski, DPM;  Location: [facility];  Service: Podiatry;  Laterality: Left;  ? right leg surgery     ? ? ?History reviewed. No pertinent family history. ? ?Social History  ? ?Socioeconomic History  ? Marital status: Married  ?  Spouse name: Not on file  ? Number of children: Not on file  ? Years of education: Not on file  ? Highest education level: Not on file  ?Occupational History  ? Not on file  ?Tobacco Use  ? Smoking status: Never  ? Smokeless tobacco: Never  ?Vaping Use  ? Vaping Use: Never used  ?Substance and Sexual Activity  ? Alcohol use: No  ? Drug use: No  ? Sexual activity: Not on file  ?Other Topics Concern  ? Not on file  ?Social History Narrative  ? Not on file  ? ?Social Determinants of Health  ? ?Financial Resource Strain: Not on file  ?Food Insecurity: Not on file  ?Transportation Needs: Not on file  ?Physical Activity: Not on file  ?Stress: Not on file  ?Social Connections: Not on file  ? ?Outpatient Encounter Medications as of 04/01/2022  ?Medication Sig  ? Dulaglutide (TRULICITY) 1.5 QI/2.9NL SOPN Inject 1.5 mg into the skin once a week.  ? ACCU-CHEK FASTCLIX LANCETS MISC Use to test blood glucose 2 times a day.  ? allopurinol (ZYLOPRIM) 100 MG tablet Take 100 mg by mouth daily.  ? amoxicillin-clavulanate (AUGMENTIN) 875-125 MG tablet Take 1 tablet by mouth 2 (two) times daily.  ? aspirin EC 81 MG tablet Take 81 mg by mouth daily.  ? Blood Glucose Monitoring Suppl (ACCU-CHEK GUIDE) w/Device KIT   ? cadexomer iodine (IODOSORB) 0.9 % gel Apply to left 3rd toe once daily.  ?  CONTOUR NEXT TEST test strip USE TO TEST BLOOD SUGAR TWICE DAILY AS DIRECTED.  ? cyclobenzaprine (FLEXERIL) 5 MG tablet Take by mouth.  ? docusate sodium (COLACE) 100 MG capsule Take 100 mg by mouth 2 (two) times daily.  ? doxycycline (VIBRA-TABS) 100 MG tablet Take 1 tablet (100 mg total) by mouth 2 (two) times daily.  ? ELIQUIS 2.5 MG TABS tablet Take 2.5 mg by mouth 2 (two) times daily.  ? famotidine (PEPCID) 20 MG tablet Take by mouth.  ? gabapentin (NEURONTIN) 300 MG capsule at bedtime.  ? GLOBAL EASE INJECT PEN NEEDLES 31G X 8 MM MISC USE AS DIRECTED.  ? glucose blood (ACCU-CHEK GUIDE) test strip Use as instructed bid  ? lisinopril-hydrochlorothiazide (PRINZIDE,ZESTORETIC) 20-25 MG per tablet Take 1 tablet by mouth daily.  ? metFORMIN (GLUCOPHAGE-XR) 500 MG 24 hr tablet 500 mg daily with breakfast.  ? Multiple Vitamin (MULTIVITAMIN WITH MINERALS) TABS tablet Take 1 tablet by mouth daily.  ? ondansetron (ZOFRAN) 8 MG tablet Take 8 mg by mouth 3 (three) times daily.  ? ondansetron (ZOFRAN-ODT) 4 MG disintegrating tablet Take 2 mg by mouth every 6 (six) hours as needed.  ? oxyCODONE (OXY IR/ROXICODONE) 5 MG immediate release tablet Take by mouth.  ? pantoprazole (PROTONIX) 40 MG tablet   ? pravastatin (  PRAVACHOL) 40 MG tablet TAKE 1 TABLET BY MOUTH ONCE DAILY.  ? Wound Cleanser LIQD Clean left 3rd toe wound daily.  ? [DISCONTINUED] Dulaglutide (TRULICITY ) Inject 6.26 mg into the skin once a week.  ? ?No facility-administered encounter medications on file as of 04/01/2022.  ? ?ALLERGIES: ?Allergies  ?Allergen Reactions  ? Codeine Nausea And Vomiting  ? Aspirin Other (See Comments)  ?  Other reaction(s): Abdominal Pain, Other (See Comments) ?  ? Ibuprofen Other (See Comments)  ?  Other reaction(s): Abdominal Pain, Other (See Comments) ?  ? ?VACCINATION STATUS: ?Immunization History  ?Administered Date(s) Administered  ? Influenza,inj,Quad PF,6+ Mos 10/13/2017, 10/06/2019  ? ? ?Diabetes ?He presents for his  follow-up diabetic visit. He has type 2 diabetes mellitus. His disease course has been stable. There are no hypoglycemic associated symptoms. Pertinent negatives for hypoglycemia include no confusion, headaches, pallor or seizures. Pertinent negatives for diabetes include no chest pain, no fatigue, no polydipsia, no polyphagia, no polyuria and no weakness. There are no hypoglycemic complications. Symptoms are stable. Diabetic complications include peripheral neuropathy. Risk factors for coronary artery disease include diabetes mellitus, dyslipidemia, hypertension, male sex, obesity and sedentary lifestyle. He is compliant with treatment most of the time. His weight is fluctuating minimally. He is following a generally unhealthy diet. When asked about meal planning, he reported none. He has had a previous visit with a dietitian. He never participates in exercise. (He presents with controlled diabetes with A1c of 6.4%, although increasing from 5.8% during his last visit.  He did not document any hypoglycemia.   ?) An ACE inhibitor/angiotensin II receptor blocker is being taken. Eye exam is current.  ?Hyperlipidemia ?This is a chronic problem. The current episode started more than 1 year ago. The problem is uncontrolled. Exacerbating diseases include diabetes and obesity. Pertinent negatives include no chest pain, myalgias or shortness of breath. Current antihyperlipidemic treatment includes statins. Compliance problems include adherence to diet.  Risk factors for coronary artery disease include diabetes mellitus, dyslipidemia, hypertension, male sex, obesity and a sedentary lifestyle.  ?Hypertension ?This is a chronic problem. The current episode started more than 1 year ago. Pertinent negatives include no chest pain, headaches, neck pain, palpitations or shortness of breath. Risk factors for coronary artery disease include dyslipidemia, diabetes mellitus, obesity, male gender and sedentary lifestyle. Past treatments  include ACE inhibitors.  ? ? ?Review of systems ?Limited as above. ? ? ?Objective:  ?  ?BP 136/78   Pulse 76   Ht 5' 5.5" (1.664 m)   Wt 240 lb 12.8 oz (109.2 kg)   BMI 39.46 kg/m?   ?Wt Readings from Last 3 Encounters:  ?04/01/22 240 lb 12.8 oz (109.2 kg)  ?02/12/22 228 lb (103.4 kg)  ?03/12/21 250 lb 3.2 oz (113.5 kg)  ?  ? ? ? ? ?Recent Results (from the past 2160 hour(s))  ?Sedimentation rate     Status: Abnormal  ? Collection Time: 02/12/22  3:13 PM  ?Result Value Ref Range  ? Sed Rate 36 (H) 0 - 20 mm/h  ?C-reactive protein     Status: Abnormal  ? Collection Time: 02/12/22  3:13 PM  ?Result Value Ref Range  ? CRP 45.3 (H) <8.0 mg/L  ?Hepatic function panel     Status: None  ? Collection Time: 02/12/22  3:13 PM  ?Result Value Ref Range  ? Total Protein 7.7 6.1 - 8.1 g/dL  ? Albumin 4.2 3.6 - 5.1 g/dL  ? Globulin 3.5 1.9 - 3.7 g/dL (calc)  ?  AG Ratio 1.2 1.0 - 2.5 (calc)  ? Total Bilirubin 0.4 0.2 - 1.2 mg/dL  ? Bilirubin, Direct 0.1 0.0 - 0.2 mg/dL  ? Indirect Bilirubin 0.3 0.2 - 1.2 mg/dL (calc)  ? Alkaline phosphatase (APISO) 115 35 - 144 U/L  ? AST 13 10 - 35 U/L  ? ALT 10 9 - 46 U/L  ?HgB A1c     Status: None  ? Collection Time: 04/01/22  2:01 PM  ?Result Value Ref Range  ? Hemoglobin A1C    ? HbA1c POC (<> result, manual entry)    ? HbA1c, POC (prediabetic range)    ? HbA1c, POC (controlled diabetic range) 6.4 0.0 - 7.0 %  ? ?Lipid Panel  ?   ?Component Value Date/Time  ? CHOL 174 01/01/2022 0000  ? TRIG 227 (A) 01/01/2022 0000  ? HDL 47 01/01/2022 0000  ? CHOLHDL 4.1 10/06/2018 1310  ? VLDL 29 04/15/2016 0836  ? [place] 89 01/01/2022 0000  ? LDLCALC 114 (H) 10/06/2018 1310  ? ? ?Assessment & Plan:  ? ?1. Diabetes mellitus without complication ([place]) ? ?- His diabetes is  complicated by peripheral neuropathy , peripheral arterial disease, Charcot's deformities and patient remains at a high risk for more acute and chronic complications of diabetes which include CAD, CVA, CKD, retinopathy, and neuropathy.  These are all discussed in detail with the patient. ? ?He presents with controlled glycemic profile and point-of-care A1c of 6.4% increasing from 5.8%.  He does not report or document hypoglycemia. ? ?- Recent labs

## 2022-04-16 ENCOUNTER — Ambulatory Visit: Payer: Medicare HMO | Admitting: Internal Medicine

## 2022-04-22 ENCOUNTER — Ambulatory Visit: Payer: Medicare HMO | Admitting: Podiatry

## 2022-04-22 ENCOUNTER — Ambulatory Visit (INDEPENDENT_AMBULATORY_CARE_PROVIDER_SITE_OTHER): Payer: Medicare HMO | Admitting: Infectious Disease

## 2022-04-22 ENCOUNTER — Encounter: Payer: Self-pay | Admitting: Infectious Disease

## 2022-04-22 ENCOUNTER — Other Ambulatory Visit: Payer: Self-pay

## 2022-04-22 VITALS — BP 115/74 | HR 93 | Temp 97.7°F | Wt 240.0 lb

## 2022-04-22 DIAGNOSIS — Z6839 Body mass index (BMI) 39.0-39.9, adult: Secondary | ICD-10-CM | POA: Diagnosis not present

## 2022-04-22 DIAGNOSIS — T8453XD Infection and inflammatory reaction due to internal right knee prosthesis, subsequent encounter: Secondary | ICD-10-CM

## 2022-04-22 NOTE — Progress Notes (Signed)
? ?Subjective:  ?Chief complaint still with some knee pain though not constantly more so at night ? Patient ID: Calvin Shields, male    DOB: [birth year], 66 y.o.   MRN: [id number] ? ?HPI ? 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 Sheliah Plane with poly exchange.  Cultures were negative and he completed IV antibiotics followed by doxycycline and Augmentin. ? ?He was see Dr. Linus Salmons for follow-up but no appointments were available so his schedule with me.  He tells me that he was instructed to stop his antibiotics 2 weeks prior to the appointment she has done so. ? ?Pain in the knee seems stable compared to before and he has largely more pain at night or sometimes with certain movement but certainly not at rest.  His inflammatory markers were coming down when last checked. ? ?He has no systemic symptoms to suggest systemic infection. ? ?He had had some swelling in the knee and had seen his orthopedic surgeon but that swelling is now subsided. ? ?Past Medical History:  ?Diagnosis Date  ? Diabetes mellitus without complication ([place])   ? Hyperlipidemia   ? Hypertension   ? ? ?Past Surgical History:  ?Procedure Laterality Date  ? AMPUTATION Left 09/08/2017  ? Procedure: PARTIAL TOE AMPUTATION LEFT HALLUX;  Surgeon: Tyson Babinski, DPM;  Location: [facility];  Service: Podiatry;  Laterality: Left;  ? right leg surgery     ? ? ?No family history on file. ? ?  ?Social History  ? ?Socioeconomic History  ? Marital status: Married  ?  Spouse name: Not on file  ? Number of children: Not on file  ? Years of education: Not on file  ? Highest education level: Not on file  ?Occupational History  ? Not on file  ?Tobacco Use  ? Smoking status: Never  ? Smokeless tobacco: Never  ?Vaping Use  ? Vaping Use: Never used  ?Substance and Sexual Activity  ? Alcohol use: No  ? Drug use: No  ? Sexual activity: Not on file  ?Other Topics Concern  ? Not on file  ?Social History  Narrative  ? Not on file  ? ?Social Determinants of Health  ? ?Financial Resource Strain: Not on file  ?Food Insecurity: Not on file  ?Transportation Needs: Not on file  ?Physical Activity: Not on file  ?Stress: Not on file  ?Social Connections: Not on file  ? ? ?Allergies  ?Allergen Reactions  ? Codeine Nausea And Vomiting  ? Aspirin Other (See Comments)  ?  Other reaction(s): Abdominal Pain, Other (See Comments) ?  ? Ibuprofen Other (See Comments)  ?  Other reaction(s): Abdominal Pain, Other (See Comments) ?  ? ? ? ?Current Outpatient Medications:  ?  ACCU-CHEK FASTCLIX LANCETS MISC, Use to test blood glucose 2 times a day., Disp: 5 each, Rfl: 2 ?  allopurinol (ZYLOPRIM) 100 MG tablet, Take 100 mg by mouth daily., Disp: , Rfl:  ?  aspirin EC 81 MG tablet, Take 81 mg by mouth daily., Disp: , Rfl:  ?  Blood Glucose Monitoring Suppl (ACCU-CHEK GUIDE) w/Device KIT, , Disp: , Rfl:  ?  cadexomer iodine (IODOSORB) 0.9 % gel, Apply to left 3rd toe once daily., Disp: 40 g, Rfl: 0 ?  CONTOUR NEXT TEST test strip, USE TO TEST BLOOD SUGAR TWICE DAILY AS DIRECTED., Disp: 100 each, Rfl: 5 ?  cyclobenzaprine (FLEXERIL) 5 MG tablet, Take by mouth., Disp: , Rfl:  ?  docusate sodium (COLACE) 100 MG capsule, Take 100 mg by mouth 2 (two) times daily., Disp: , Rfl:  ?  Dulaglutide (TRULICITY) 1.5 GG/8.3MO SOPN, Inject 1.5 mg into the skin once a week., Disp: 2 mL, Rfl: 3 ?  ELIQUIS 2.5 MG TABS tablet, Take 2.5 mg by mouth 2 (two) times daily., Disp: , Rfl:  ?  famotidine (PEPCID) 20 MG tablet, Take by mouth., Disp: , Rfl:  ?  gabapentin (NEURONTIN) 300 MG capsule, at bedtime., Disp: , Rfl:  ?  GLOBAL EASE INJECT PEN NEEDLES 31G X 8 MM MISC, USE AS DIRECTED., Disp: 60 each, Rfl: 0 ?  glucose blood (ACCU-CHEK GUIDE) test strip, Use as instructed bid, Disp: 100 each, Rfl: 5 ?  lisinopril-hydrochlorothiazide (PRINZIDE,ZESTORETIC) 20-25 MG per tablet, Take 1 tablet by mouth daily., Disp: , Rfl:  ?  metFORMIN (GLUCOPHAGE-XR) 500 MG 24 hr  tablet, 500 mg daily with breakfast., Disp: , Rfl:  ?  Multiple Vitamin (MULTIVITAMIN WITH MINERALS) TABS tablet, Take 1 tablet by mouth daily., Disp: , Rfl:  ?  ondansetron (ZOFRAN) 8 MG tablet, Take 8 mg by mouth 3 (three) times daily., Disp: , Rfl:  ?  ondansetron (ZOFRAN-ODT) 4 MG disintegrating tablet, Take 2 mg by mouth every 6 (six) hours as needed., Disp: , Rfl:  ?  oxyCODONE (OXY IR/ROXICODONE) 5 MG immediate release tablet, Take by mouth., Disp: , Rfl:  ?  pantoprazole (PROTONIX) 40 MG tablet, , Disp: , Rfl:  ?  pravastatin (PRAVACHOL) 40 MG tablet, TAKE 1 TABLET BY MOUTH ONCE DAILY., Disp: 90 tablet, Rfl: 1 ?  Wound Cleanser LIQD, Clean left 3rd toe wound daily., Disp: 473 mL, Rfl: 0 ?  amoxicillin-clavulanate (AUGMENTIN) 875-125 MG tablet, Take 1 tablet by mouth 2 (two) times daily. (Patient not taking: Reported on 04/22/2022), Disp: 60 tablet, Rfl: 2 ?  doxycycline (VIBRA-TABS) 100 MG tablet, Take 1 tablet (100 mg total) by mouth 2 (two) times daily. (Patient not taking: Reported on 04/22/2022), Disp: 60 tablet, Rfl: 2 ? ? ?Review of Systems  ?Constitutional:  Negative for activity change, appetite change, chills, diaphoresis, fatigue, fever and unexpected weight change.  ?HENT:  Negative for congestion, rhinorrhea, sinus pressure, sneezing, sore throat and trouble swallowing.   ?Eyes:  Negative for photophobia and visual disturbance.  ?Respiratory:  Negative for cough, chest tightness, shortness of breath, wheezing and stridor.   ?Cardiovascular:  Negative for chest pain, palpitations and leg swelling.  ?Gastrointestinal:  Negative for abdominal distention, abdominal pain, anal bleeding, blood in stool, constipation, diarrhea, nausea and vomiting.  ?Genitourinary:  Negative for difficulty urinating, dysuria, flank pain and hematuria.  ?Musculoskeletal:  Positive for arthralgias. Negative for back pain, gait problem, joint swelling and myalgias.  ?Skin:  Negative for color change, pallor, rash and wound.   ?Neurological:  Negative for dizziness, tremors, weakness and light-headedness.  ?Hematological:  Negative for adenopathy. Does not bruise/bleed easily.  ?Psychiatric/Behavioral:  Negative for agitation, behavioral problems, confusion, decreased concentration, dysphoric mood and sleep disturbance.   ? ?   ?Objective:  ? Physical Exam ?Constitutional:   ?   Appearance: He is well-developed.  ?HENT:  ?   Head: Normocephalic and atraumatic.  ?Eyes:  ?   Conjunctiva/sclera: Conjunctivae normal.  ?Cardiovascular:  ?   Rate and Rhythm: Normal rate and regular rhythm.  ?Pulmonary:  ?   Effort: Pulmonary effort is normal. No respiratory distress.  ?   Breath sounds: No wheezing.  ?Abdominal:  ?   General: There is no distension.  ?  Palpations: Abdomen is soft.  ?Musculoskeletal:     ?   General: No tenderness. Normal range of motion.  ?   Cervical back: Normal range of motion and neck supple.  ?Skin: ?   General: Skin is warm and dry.  ?   Coloration: Skin is not pale.  ?   Findings: No erythema or rash.  ?Neurological:  ?   General: No focal deficit present.  ?   Mental Status: He is alert and oriented to person, place, and time.  ?Psychiatric:     ?   Mood and Affect: Mood normal.     ?   Behavior: Behavior normal.     ?   Thought Content: Thought content normal.     ?   Judgment: Judgment normal.  ? ?Right knee 04/22/22: ? ? ? ? ? ? ?   ?Assessment & Plan:  ? 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 ? ?He is now off oral antibiotics. ? ?I will check sed rate CRP BMP with GFR and CBC with differential. ? ?Provide inflammatory markers are reassuring we will have him come in follow-up in another 2 months off antibiotics. ? ?They are not reassuring we will have him resume his oral antibiotics. ? ?DM: On Trulicity and metformin ? ?I spent 43 minutes with the patient including than 50% of the time in face to  face counseling of the patient has prosthetic joint infection along with review of medical records in preparation for the visit and during the visit and in coordination of his care. ? ?

## 2022-04-22 NOTE — Patient Instructions (Signed)
Followup in 2 months with Dr. Luciana Axe ?

## 2022-04-23 LAB — CBC WITH DIFFERENTIAL/PLATELET
Absolute Monocytes: 632 cells/uL (ref 200–950)
Basophils Absolute: 62 cells/uL (ref 0–200)
Basophils Relative: 0.8 %
Eosinophils Absolute: 94 cells/uL (ref 15–500)
Eosinophils Relative: 1.2 %
HCT: 35.9 % — ABNORMAL LOW (ref 38.5–50.0)
Hemoglobin: 11.3 g/dL — ABNORMAL LOW (ref 13.2–17.1)
Lymphs Abs: 2465 cells/uL (ref 850–3900)
MCH: 25.8 pg — ABNORMAL LOW (ref 27.0–33.0)
MCHC: 31.5 g/dL — ABNORMAL LOW (ref 32.0–36.0)
MCV: 82 fL (ref 80.0–100.0)
MPV: 9.2 fL (ref 7.5–12.5)
Monocytes Relative: 8.1 %
Neutro Abs: 4547 cells/uL (ref 1500–7800)
Neutrophils Relative %: 58.3 %
Platelets: 536 10*3/uL — ABNORMAL HIGH (ref 140–400)
RBC: 4.38 10*6/uL (ref 4.20–5.80)
RDW: 15.3 % — ABNORMAL HIGH (ref 11.0–15.0)
Total Lymphocyte: 31.6 %
WBC: 7.8 10*3/uL (ref 3.8–10.8)

## 2022-04-23 LAB — C-REACTIVE PROTEIN: CRP: 12.1 mg/L — ABNORMAL HIGH (ref <8.0)

## 2022-04-23 LAB — BASIC METABOLIC PANEL WITH GFR
BUN: 15 mg/dL (ref 7–25)
CO2: 31 mmol/L (ref 20–32)
Calcium: 9.8 mg/dL (ref 8.6–10.3)
Chloride: 98 mmol/L (ref 98–110)
Creat: 1.33 mg/dL (ref 0.70–1.35)
Glucose, Bld: 112 mg/dL — ABNORMAL HIGH (ref 65–99)
Potassium: 3.8 mmol/L (ref 3.5–5.3)
Sodium: 137 mmol/L (ref 135–146)
eGFR: 59 mL/min/{1.73_m2} — ABNORMAL LOW (ref >=60)

## 2022-04-23 LAB — SEDIMENTATION RATE: Sed Rate: 19 mm/h (ref 0–20)

## 2022-04-28 DIAGNOSIS — E782 Mixed hyperlipidemia: Secondary | ICD-10-CM | POA: Diagnosis not present

## 2022-04-28 DIAGNOSIS — I10 Essential (primary) hypertension: Secondary | ICD-10-CM | POA: Diagnosis not present

## 2022-04-28 DIAGNOSIS — E1142 Type 2 diabetes mellitus with diabetic polyneuropathy: Secondary | ICD-10-CM | POA: Diagnosis not present

## 2022-04-29 ENCOUNTER — Ambulatory Visit (INDEPENDENT_AMBULATORY_CARE_PROVIDER_SITE_OTHER): Payer: Medicare HMO | Admitting: Podiatry

## 2022-04-29 ENCOUNTER — Encounter: Payer: Self-pay | Admitting: Podiatry

## 2022-04-29 DIAGNOSIS — Q828 Other specified congenital malformations of skin: Secondary | ICD-10-CM

## 2022-04-29 DIAGNOSIS — E1142 Type 2 diabetes mellitus with diabetic polyneuropathy: Secondary | ICD-10-CM

## 2022-04-29 DIAGNOSIS — L84 Corns and callosities: Secondary | ICD-10-CM | POA: Diagnosis not present

## 2022-04-29 DIAGNOSIS — M79675 Pain in left toe(s): Secondary | ICD-10-CM

## 2022-04-29 DIAGNOSIS — B351 Tinea unguium: Secondary | ICD-10-CM

## 2022-04-29 DIAGNOSIS — M79674 Pain in right toe(s): Secondary | ICD-10-CM | POA: Diagnosis not present

## 2022-04-29 DIAGNOSIS — Z89412 Acquired absence of left great toe: Secondary | ICD-10-CM | POA: Diagnosis not present

## 2022-05-06 DIAGNOSIS — E1142 Type 2 diabetes mellitus with diabetic polyneuropathy: Secondary | ICD-10-CM | POA: Diagnosis not present

## 2022-05-06 DIAGNOSIS — I10 Essential (primary) hypertension: Secondary | ICD-10-CM | POA: Diagnosis not present

## 2022-05-06 DIAGNOSIS — E782 Mixed hyperlipidemia: Secondary | ICD-10-CM | POA: Diagnosis not present

## 2022-05-06 DIAGNOSIS — E7849 Other hyperlipidemia: Secondary | ICD-10-CM | POA: Diagnosis not present

## 2022-05-07 DIAGNOSIS — H1010 Acute atopic conjunctivitis, unspecified eye: Secondary | ICD-10-CM | POA: Diagnosis not present

## 2022-05-07 DIAGNOSIS — Z6836 Body mass index (BMI) 36.0-36.9, adult: Secondary | ICD-10-CM | POA: Diagnosis not present

## 2022-05-07 DIAGNOSIS — Z20828 Contact with and (suspected) exposure to other viral communicable diseases: Secondary | ICD-10-CM | POA: Diagnosis not present

## 2022-05-07 DIAGNOSIS — J309 Allergic rhinitis, unspecified: Secondary | ICD-10-CM | POA: Diagnosis not present

## 2022-05-07 NOTE — Progress Notes (Signed)
?  Subjective:  ?Patient ID: Calvin Shields, male    DOB: 1956-08-14,  MRN: [id number] ? ?[name] presents to clinic today for at risk foot care. Patient has h/o amputation of partial amputation of left great toe and thick, elongated toenails b/l lower extremities which are tender when wearing enclosed shoe gear. ? ?Patient states blood glucose was 98 mg/dl today.  Last known HgA1c was 6.1%. ? ?New problem(s): None.  ? ?PCP is Richardean Chimera, MD , and last visit was February 26, 2022. ? ?Allergies  ?Allergen Reactions  ? Codeine Nausea And Vomiting  ? Aspirin Other (See Comments)  ?  Other reaction(s): Abdominal Pain, Other (See Comments) ?  ? Ibuprofen Other (See Comments)  ?  Other reaction(s): Abdominal Pain, Other (See Comments) ?  ? ? ?Review of Systems: Negative except as noted in the HPI. ? ?Objective: No changes noted in today's physical examination. ? ?There were no vitals filed for this visit. ? ?Calvin Shields is a pleasant 66 y.o. male in NAD. AAO X 3. ? ?Vascular Examination: ?CFT <3 seconds b/l LE. Palpable DP pulse(s) b/l LE. Faintly palpable PT pulse(s) b/l LE. Pedal hair absent. No pain with calf compression b/l. Trace edema noted BLE. No ischemia or gangrene noted b/l LE. No cyanosis or clubbing noted b/l LE. ? ?Dermatological Examination: ?No interdigital macerations noted b/l LE. Toenails 2-5 bilaterally and R hallux elongated, discolored, dystrophic, thickened, and crumbly with subungual debris and tenderness to dorsal palpation. Porokeratotic lesion(s) plantar heel pad of right foot. No erythema, no edema, no drainage, no fluctuance. Preulcerative lesion noted distal tip left 2nd digit. There is visible subdermal hemorrhage. There is no surrounding erythema, no edema, no drainage, no odor, no fluctuance. ? ?Musculoskeletal Examination: ?Muscle strength 5/5 to all lower extremity muscle groups bilaterally. Lower extremity amputation(s): partial amputation of L hallux. ? ?Neurological  Examination: ?Protective sensation diminished with 10g monofilament b/l. Vibratory sensation diminished b/l. Proprioception intact bilaterally. ? ? ?  Latest Ref Rng & Units 04/01/2022  ?  2:01 PM 10/01/2021  ?  2:20 PM 06/18/2021  ?  2:04 PM  ?Hemoglobin A1C  ?Hemoglobin-A1c 0.0 - 7.0 % 6.4   5.8   6.0    ? ?Assessment/Plan: ?1. Onychomycosis   ?2. Pre-ulcerative corn or callous   ?3. Porokeratosis   ?4. Status post amputation of great toe, left ([facility])   ?5. Diabetic peripheral neuropathy associated with type 2 diabetes mellitus ([facility])   ?  ? ?-Patient was evaluated and treated. All patient's and/or POA's questions/concerns answered on today's visit. ?-Toenails 2-5 bilaterally and right great toe debrided in length and girth without iatrogenic bleeding with sterile nail nipper and dremel.  ?-Preulcerative lesion pared L 2nd toe. Total number pared=1. ?-Porokeratotic lesion(s) plantar heel pad of right foot pared and enucleated with sterile scalpel blade without incident. Total number of lesions debrided=1. ?-Patient/POA to call should there be question/concern in the interim.  ? ?Return in about 3 months (around 07/30/2022). ? ?Calvin Shields, DPM  ?

## 2022-05-10 DIAGNOSIS — E7849 Other hyperlipidemia: Secondary | ICD-10-CM | POA: Diagnosis not present

## 2022-05-10 DIAGNOSIS — N400 Enlarged prostate without lower urinary tract symptoms: Secondary | ICD-10-CM | POA: Diagnosis not present

## 2022-05-10 DIAGNOSIS — E1142 Type 2 diabetes mellitus with diabetic polyneuropathy: Secondary | ICD-10-CM | POA: Diagnosis not present

## 2022-05-10 DIAGNOSIS — M1712 Unilateral primary osteoarthritis, left knee: Secondary | ICD-10-CM | POA: Diagnosis not present

## 2022-05-10 DIAGNOSIS — G6289 Other specified polyneuropathies: Secondary | ICD-10-CM | POA: Diagnosis not present

## 2022-05-10 DIAGNOSIS — I10 Essential (primary) hypertension: Secondary | ICD-10-CM | POA: Diagnosis not present

## 2022-05-10 DIAGNOSIS — N1831 Chronic kidney disease, stage 3a: Secondary | ICD-10-CM | POA: Diagnosis not present

## 2022-05-20 DIAGNOSIS — Z96651 Presence of right artificial knee joint: Secondary | ICD-10-CM | POA: Diagnosis not present

## 2022-05-20 DIAGNOSIS — Z6834 Body mass index (BMI) 34.0-34.9, adult: Secondary | ICD-10-CM | POA: Diagnosis not present

## 2022-05-20 DIAGNOSIS — M25461 Effusion, right knee: Secondary | ICD-10-CM | POA: Diagnosis not present

## 2022-06-21 ENCOUNTER — Encounter: Payer: Self-pay | Admitting: Internal Medicine

## 2022-06-21 ENCOUNTER — Other Ambulatory Visit: Payer: Self-pay

## 2022-06-21 ENCOUNTER — Telehealth (INDEPENDENT_AMBULATORY_CARE_PROVIDER_SITE_OTHER): Payer: Medicare HMO | Admitting: Internal Medicine

## 2022-06-21 DIAGNOSIS — T8453XD Infection and inflammatory reaction due to internal right knee prosthesis, subsequent encounter: Secondary | ICD-10-CM | POA: Diagnosis not present

## 2022-06-21 NOTE — Progress Notes (Signed)
   Subjective:   I connected with  Calvin Shields on 06/21/22 by telephone and verified that I am speaking with the correct person using two identifiers.   I discussed the limitations of evaluation and management by telemedicine. The patient expressed understanding and agreed to proceed.  Location: Patient - home Physician - clinic  Duration of visit:  12 minutes    Patient ID: Calvin Shields, male    DOB: 1956-03-17, 66 y.o.   MRN: [id number]  HPI Called for follow up of a prosthetic joint infection, s/p polyexchange and treatment with IV antibiotics and oral continuation treatment.  Now off antibiotics over 2 months.  Left knee continues to do well. Aspiration by Dr. Emilia Beck last month reasurring with only about 500 wbcs.  No new selling or warmth.  Plan for other knee in August.    Review of Systems  Constitutional:  Negative for chills and fever.       Objective:   Physical Exam Neurological:     Mental Status: He is alert.           Assessment & Plan:

## 2022-07-15 DIAGNOSIS — H524 Presbyopia: Secondary | ICD-10-CM | POA: Diagnosis not present

## 2022-07-15 DIAGNOSIS — H2513 Age-related nuclear cataract, bilateral: Secondary | ICD-10-CM | POA: Diagnosis not present

## 2022-07-15 DIAGNOSIS — E119 Type 2 diabetes mellitus without complications: Secondary | ICD-10-CM | POA: Diagnosis not present

## 2022-07-15 DIAGNOSIS — Z7984 Long term (current) use of oral hypoglycemic drugs: Secondary | ICD-10-CM | POA: Diagnosis not present

## 2022-07-15 DIAGNOSIS — H5213 Myopia, bilateral: Secondary | ICD-10-CM | POA: Diagnosis not present

## 2022-07-15 DIAGNOSIS — Z0100 Encounter for examination of eyes and vision without abnormal findings: Secondary | ICD-10-CM | POA: Diagnosis not present

## 2022-07-15 LAB — HM DIABETES EYE EXAM

## 2022-07-23 DIAGNOSIS — I10 Essential (primary) hypertension: Secondary | ICD-10-CM | POA: Diagnosis not present

## 2022-07-23 DIAGNOSIS — R55 Syncope and collapse: Secondary | ICD-10-CM | POA: Diagnosis not present

## 2022-07-23 DIAGNOSIS — J439 Emphysema, unspecified: Secondary | ICD-10-CM | POA: Diagnosis not present

## 2022-07-23 DIAGNOSIS — R0902 Hypoxemia: Secondary | ICD-10-CM | POA: Diagnosis not present

## 2022-07-23 DIAGNOSIS — I272 Pulmonary hypertension, unspecified: Secondary | ICD-10-CM | POA: Diagnosis not present

## 2022-07-23 DIAGNOSIS — R410 Disorientation, unspecified: Secondary | ICD-10-CM | POA: Diagnosis not present

## 2022-07-23 DIAGNOSIS — R531 Weakness: Secondary | ICD-10-CM | POA: Diagnosis not present

## 2022-07-23 DIAGNOSIS — I700 Atherosclerosis of aorta: Secondary | ICD-10-CM | POA: Diagnosis not present

## 2022-07-23 DIAGNOSIS — I6523 Occlusion and stenosis of bilateral carotid arteries: Secondary | ICD-10-CM | POA: Diagnosis not present

## 2022-07-23 DIAGNOSIS — E1122 Type 2 diabetes mellitus with diabetic chronic kidney disease: Secondary | ICD-10-CM | POA: Diagnosis not present

## 2022-07-23 DIAGNOSIS — R42 Dizziness and giddiness: Secondary | ICD-10-CM | POA: Diagnosis not present

## 2022-07-23 DIAGNOSIS — T671XXA Heat syncope, initial encounter: Secondary | ICD-10-CM | POA: Diagnosis not present

## 2022-07-23 DIAGNOSIS — M25562 Pain in left knee: Secondary | ICD-10-CM | POA: Diagnosis not present

## 2022-07-23 DIAGNOSIS — K219 Gastro-esophageal reflux disease without esophagitis: Secondary | ICD-10-CM | POA: Diagnosis not present

## 2022-07-23 DIAGNOSIS — R000 Tachycardia, unspecified: Secondary | ICD-10-CM | POA: Diagnosis not present

## 2022-07-23 DIAGNOSIS — E119 Type 2 diabetes mellitus without complications: Secondary | ICD-10-CM | POA: Diagnosis not present

## 2022-07-23 DIAGNOSIS — R4182 Altered mental status, unspecified: Secondary | ICD-10-CM | POA: Diagnosis not present

## 2022-07-23 DIAGNOSIS — I6503 Occlusion and stenosis of bilateral vertebral arteries: Secondary | ICD-10-CM | POA: Diagnosis not present

## 2022-07-24 DIAGNOSIS — E1122 Type 2 diabetes mellitus with diabetic chronic kidney disease: Secondary | ICD-10-CM | POA: Diagnosis not present

## 2022-07-24 DIAGNOSIS — R55 Syncope and collapse: Secondary | ICD-10-CM | POA: Diagnosis not present

## 2022-07-24 DIAGNOSIS — R531 Weakness: Secondary | ICD-10-CM | POA: Diagnosis not present

## 2022-07-24 DIAGNOSIS — I10 Essential (primary) hypertension: Secondary | ICD-10-CM | POA: Diagnosis not present

## 2022-07-24 DIAGNOSIS — T671XXA Heat syncope, initial encounter: Secondary | ICD-10-CM | POA: Diagnosis not present

## 2022-08-12 ENCOUNTER — Ambulatory Visit: Payer: Medicare HMO | Admitting: "Endocrinology

## 2022-08-19 ENCOUNTER — Ambulatory Visit (INDEPENDENT_AMBULATORY_CARE_PROVIDER_SITE_OTHER): Payer: Medicare HMO | Admitting: Podiatry

## 2022-08-19 ENCOUNTER — Encounter: Payer: Self-pay | Admitting: Podiatry

## 2022-08-19 DIAGNOSIS — Q828 Other specified congenital malformations of skin: Secondary | ICD-10-CM

## 2022-08-19 DIAGNOSIS — Z89412 Acquired absence of left great toe: Secondary | ICD-10-CM

## 2022-08-19 DIAGNOSIS — B351 Tinea unguium: Secondary | ICD-10-CM | POA: Diagnosis not present

## 2022-08-19 DIAGNOSIS — L84 Corns and callosities: Secondary | ICD-10-CM | POA: Diagnosis not present

## 2022-08-19 DIAGNOSIS — M79674 Pain in right toe(s): Secondary | ICD-10-CM

## 2022-08-19 DIAGNOSIS — E1142 Type 2 diabetes mellitus with diabetic polyneuropathy: Secondary | ICD-10-CM | POA: Diagnosis not present

## 2022-08-19 DIAGNOSIS — M79675 Pain in left toe(s): Secondary | ICD-10-CM | POA: Diagnosis not present

## 2022-08-23 DIAGNOSIS — T671XXA Heat syncope, initial encounter: Secondary | ICD-10-CM | POA: Diagnosis not present

## 2022-08-23 DIAGNOSIS — Z6835 Body mass index (BMI) 35.0-35.9, adult: Secondary | ICD-10-CM | POA: Diagnosis not present

## 2022-08-26 ENCOUNTER — Ambulatory Visit: Payer: Medicare HMO | Admitting: "Endocrinology

## 2022-08-26 ENCOUNTER — Encounter: Payer: Self-pay | Admitting: "Endocrinology

## 2022-08-26 VITALS — BP 118/68 | HR 84 | Ht 65.5 in | Wt 233.8 lb

## 2022-08-26 DIAGNOSIS — I10 Essential (primary) hypertension: Secondary | ICD-10-CM

## 2022-08-26 DIAGNOSIS — E1165 Type 2 diabetes mellitus with hyperglycemia: Secondary | ICD-10-CM

## 2022-08-26 DIAGNOSIS — E782 Mixed hyperlipidemia: Secondary | ICD-10-CM

## 2022-08-26 LAB — POCT GLYCOSYLATED HEMOGLOBIN (HGB A1C): HbA1c, POC (controlled diabetic range): 6 % (ref 0.0–7.0)

## 2022-08-26 NOTE — Patient Instructions (Signed)
                                     Advice for Weight Management  -For most of us the best way to lose weight is by diet management. Generally speaking, diet management means consuming less calories intentionally which over time brings about progressive weight loss.  This can be achieved more effectively by avoiding ultra processed carbohydrates, processed meats, unhealthy fats.    It is critically important to know your numbers: how much calorie you are consuming and how much calorie you need. More importantly, our carbohydrates sources should be unprocessed naturally occurring  complex starch food items.  It is always important to balance nutrition also by  appropriate intake of proteins (mainly plant-based), healthy fats/oils, plenty of fruits and vegetables.   -The American College of Lifestyle Medicine (ACL M) recommends nutrition derived mostly from Whole Food, Plant Predominant Sources example an apple instead of applesauce or apple pie. Eat Plenty of vegetables, Mushrooms, fruits, Legumes, Whole Grains, Nuts, seeds in lieu of processed meats, processed snacks/pastries red meat, poultry, eggs.  Use only water or unsweetened tea for hydration.  The College also recommends the need to stay away from risky substances including alcohol, smoking; obtaining 7-9 hours of restorative sleep, at least 150 minutes of moderate intensity exercise weekly, importance of healthy social connections, and being mindful of stress and seek help when it is overwhelming.    -Sticking to a routine mealtime to eat 3 meals a day and avoiding unnecessary snacks is shown to have a big role in weight control. Under normal circumstances, the only time we burn stored energy is when we are hungry, so allow  some hunger to take place- hunger means no food between appropriate meal times, only water.  It is not advisable to starve.   -It is better to avoid simple carbohydrates including:  Cakes, Sweet Desserts, Ice Cream, Soda (diet and regular), Sweet Tea, Candies, Chips, Cookies, Store Bought Juices, Alcohol in Excess of  1-2 drinks a day, Lemonade,  Artificial Sweeteners, Doughnuts, Coffee Creamers, "Sugar-free" Products, etc, etc.  This is not a complete list.....    -Consulting with certified diabetes educators is proven to provide you with the most accurate and current information on diet.  Also, you may be  interested in discussing diet options/exchanges , we can schedule a visit with Calvin Shields, RDN, CDE for individualized nutrition education.  -Exercise: If you are able: 30 -60 minutes a day ,4 days a week, or 150 minutes of moderate intensity exercise weekly.    The longer the better if tolerated.  Combine stretch, strength, and aerobic activities.  If you were told in the past that you have high risk for cardiovascular diseases, or if you are currently symptomatic, you may seek evaluation by your heart doctor prior to initiating moderate to intense exercise programs.                                  Additional Care Considerations for Diabetes/Prediabetes   -Diabetes  is a chronic disease.  The most important care consideration is regular follow-up with your diabetes care provider with the goal being avoiding or delaying its complications and to take advantage of advances in medications and technology.  If appropriate actions are taken early enough, type 2 diabetes can even be   reversed.  Seek information from the right source.  - Whole Food, Plant Predominant Nutrition is highly recommended: Eat Plenty of vegetables, Mushrooms, fruits, Legumes, Whole Grains, Nuts, seeds in lieu of processed meats, processed snacks/pastries red meat, poultry, eggs as recommended by American College of  Lifestyle Medicine (ACLM).  -Type 2 diabetes is known to coexist with other important comorbidities such as high blood pressure and high cholesterol.  It is critical to control not only the  diabetes but also the high blood pressure and high cholesterol to minimize and delay the risk of complications including coronary artery disease, stroke, amputations, blindness, etc.  The good news is that this diet recommendation for type 2 diabetes is also very helpful for managing high cholesterol and high blood blood pressure.  - Studies showed that people with diabetes will benefit from a class of medications known as ACE inhibitors and statins.  Unless there are specific reasons not to be on these medications, the standard of care is to consider getting one from these groups of medications at an optimal doses.  These medications are generally considered safe and proven to help protect the heart and the kidneys.    - People with diabetes are encouraged to initiate and maintain regular follow-up with eye doctors, foot doctors, dentists , and if necessary heart and kidney doctors.     - It is highly recommended that people with diabetes quit smoking or stay away from smoking, and get yearly  flu vaccine and pneumonia vaccine at least every 5 years.  See above for additional recommendations on exercise, sleep, stress management , and healthy social connections.      

## 2022-08-26 NOTE — Progress Notes (Signed)
08/26/2022   Endocrinology follow-up note   Subjective:    Patient ID: Calvin Shields, male    DOB: 1956-03-14, PCP Caryl Bis, MD   Past Medical History:  Diagnosis Date   Diabetes mellitus without complication [facility])    Hyperlipidemia    Hypertension    Past Surgical History:  Procedure Laterality Date   AMPUTATION Left 09/08/2017   Procedure: PARTIAL TOE AMPUTATION LEFT HALLUX;  Surgeon: Tyson Babinski, DPM;  Location: [facility];  Service: Podiatry;  Laterality: Left;   right leg surgery       History reviewed. No pertinent family history.  Social History   Socioeconomic History   Marital status: Married    Spouse name: Not on file   Number of children: Not on file   Years of education: Not on file   Highest education level: Not on file  Occupational History   Not on file  Tobacco Use   Smoking status: Never   Smokeless tobacco: Never  Vaping Use   Vaping Use: Never used  Substance and Sexual Activity   Alcohol use: No   Drug use: No   Sexual activity: Not on file  Other Topics Concern   Not on file  Social History Narrative   Not on file   Social Determinants of Health   Financial Resource Strain: Not on file  Food Insecurity: Not on file  Transportation Needs: Not on file  Physical Activity: Not on file  Stress: Not on file  Social Connections: Not on file   Outpatient Encounter Medications as of 08/26/2022  Medication Sig   ACCU-CHEK FASTCLIX LANCETS MISC Use to test blood glucose 2 times a day.   allopurinol (ZYLOPRIM) 100 MG tablet Take 100 mg by mouth daily.   allopurinol (ZYLOPRIM) 100 MG tablet Take by mouth.   aspirin EC 81 MG tablet Take 81 mg by mouth daily.   Blood Glucose Monitoring Suppl (ACCU-CHEK GUIDE) w/Device KIT    cadexomer iodine (IODOSORB) 0.9 % gel Apply to left 3rd toe once daily.   cefTRIAXone (ROCEPHIN) 10 g injection    colchicine 0.6 MG tablet Take by mouth.   CONTOUR NEXT TEST test strip USE TO TEST BLOOD  SUGAR TWICE DAILY AS DIRECTED.   cyclobenzaprine (FLEXERIL) 5 MG tablet Take by mouth.   DAPTOmycin (CUBICIN) 500 MG injection    diclofenac Sodium (VOLTAREN) 1 % GEL Apply topically.   docusate sodium (COLACE) 100 MG capsule Take 100 mg by mouth 2 (two) times daily.   doxycycline (VIBRA-TABS) 100 MG tablet Take 1 tablet (100 mg total) by mouth 2 (two) times daily. (Patient not taking: Reported on 04/22/2022)   Dulaglutide (TRULICITY) 1.5 FX/5.8IT SOPN Inject 1.5 mg into the skin once a week.   ELIQUIS 2.5 MG TABS tablet Take 2.5 mg by mouth 2 (two) times daily.   famotidine (PEPCID) 20 MG tablet Take by mouth.   famotidine (PEPCID) 20 MG tablet Take by mouth.   fluticasone (FLONASE) 50 MCG/ACT nasal spray    gabapentin (NEURONTIN) 300 MG capsule at bedtime.   gabapentin (NEURONTIN) 300 MG capsule Take by mouth.   GLOBAL EASE INJECT PEN NEEDLES 31G X 8 MM MISC USE AS DIRECTED.   glucose blood (ACCU-CHEK GUIDE) test strip Use as instructed bid   lisinopril-hydrochlorothiazide (PRINZIDE,ZESTORETIC) 20-25 MG per tablet Take 1 tablet by mouth daily.   metFORMIN (GLUCOPHAGE-XR) 500 MG 24 hr tablet 500 mg daily with breakfast.   Multiple Vitamin (MULTIVITAMIN WITH MINERALS) TABS tablet Take  1 tablet by mouth daily.   ondansetron (ZOFRAN) 8 MG tablet Take 8 mg by mouth 3 (three) times daily.   ondansetron (ZOFRAN-ODT) 4 MG disintegrating tablet Take 2 mg by mouth every 6 (six) hours as needed.   oxyCODONE (OXY IR/ROXICODONE) 5 MG immediate release tablet Take by mouth.   pantoprazole (PROTONIX) 40 MG tablet    pravastatin (PRAVACHOL) 40 MG tablet TAKE 1 TABLET BY MOUTH ONCE DAILY.   promethazine (PHENERGAN) 25 MG tablet Take 25 mg by mouth every 6 (six) hours as needed.   sildenafil (REVATIO) 20 MG tablet Take by mouth.   tamsulosin (FLOMAX) 0.4 MG CAPS capsule Take 0.4 mg by mouth daily.   traMADol (ULTRAM) 50 MG tablet Take 50-100 mg by mouth 4 (four) times daily as needed.   Wound Cleanser LIQD  Clean left 3rd toe wound daily.   [DISCONTINUED] metFORMIN (GLUCOPHAGE) 500 MG tablet Take by mouth.   No facility-administered encounter medications on file as of 08/26/2022.   ALLERGIES: Allergies  Allergen Reactions   Codeine Nausea And Vomiting   Aspirin Other (See Comments)    Other reaction(s): Abdominal Pain, Other (See Comments)    Ibuprofen Other (See Comments)    Other reaction(s): Abdominal Pain, Other (See Comments)    VACCINATION STATUS: Immunization History  Administered Date(s) Administered   Influenza,inj,Quad PF,6+ Mos 10/13/2017, 10/06/2019    Diabetes He presents for his follow-up diabetic visit. He has type 2 diabetes mellitus. His disease course has been improving. There are no hypoglycemic associated symptoms. Pertinent negatives for hypoglycemia include no confusion, headaches, pallor or seizures. Pertinent negatives for diabetes include no chest pain, no fatigue, no polydipsia, no polyphagia, no polyuria and no weakness. There are no hypoglycemic complications. Symptoms are improving. Diabetic complications include peripheral neuropathy. Risk factors for coronary artery disease include diabetes mellitus, dyslipidemia, hypertension, male sex, obesity and sedentary lifestyle. He is compliant with treatment most of the time. His weight is fluctuating minimally. He is following a generally unhealthy diet. When asked about meal planning, he reported none. He has had a previous visit with a dietitian. He never participates in exercise. (He presents with controlled diabetes with A1c of 6%.  He did not document any hypoglycemia.   ) An ACE inhibitor/angiotensin II receptor blocker is being taken. Eye exam is current.  Hyperlipidemia This is a chronic problem. The current episode started more than 1 year ago. The problem is uncontrolled. Exacerbating diseases include diabetes and obesity. Pertinent negatives include no chest pain, myalgias or shortness of breath. Current  antihyperlipidemic treatment includes statins. Compliance problems include adherence to diet.  Risk factors for coronary artery disease include diabetes mellitus, dyslipidemia, hypertension, male sex, obesity and a sedentary lifestyle.  Hypertension This is a chronic problem. The current episode started more than 1 year ago. Pertinent negatives include no chest pain, headaches, neck pain, palpitations or shortness of breath. Risk factors for coronary artery disease include dyslipidemia, diabetes mellitus, obesity, male gender and sedentary lifestyle. Past treatments include ACE inhibitors.     Review of systems Limited as above.   Objective:    BP 118/68   Pulse 84   Ht 5' 5.5" (1.664 m)   Wt 233 lb 12.8 oz (106.1 kg)   BMI 38.31 kg/m   Wt Readings from Last 3 Encounters:  08/26/22 233 lb 12.8 oz (106.1 kg)  04/22/22 240 lb (108.9 kg)  04/01/22 240 lb 12.8 oz (109.2 kg)        Recent Results (from the  past 2160 hour(s))  HM DIABETES EYE EXAM     Status: None   Collection Time: 07/15/22 12:00 AM  Result Value Ref Range   HM Diabetic Eye Exam    HgB A1c     Status: None   Collection Time: 08/26/22  2:56 PM  Result Value Ref Range   Hemoglobin A1C     HbA1c POC (<> result, manual entry)     HbA1c, POC (prediabetic range)     HbA1c, POC (controlled diabetic range) 6.0 0.0 - 7.0 %   Lipid Panel     Component Value Date/Time   CHOL 174 01/01/2022 0000   TRIG 227 (A) 01/01/2022 0000   HDL 47 01/01/2022 0000   CHOLHDL 4.1 10/06/2018 1310   VLDL 29 04/15/2016 0836   LDLCALC 89 01/01/2022 0000   LDLCALC 114 (H) 10/06/2018 1310    Assessment & Plan:   1. Diabetes mellitus without complication ([place])  - His diabetes is  complicated by peripheral neuropathy , peripheral arterial disease, Charcot's deformities and patient remains at a high risk for more acute and chronic complications of diabetes which include CAD, CVA, CKD, retinopathy, and neuropathy. These are all  discussed in detail with the patient.  He presents with controlled glycemic profile and point-of-care A1c of 6% .   He does not report or document hypoglycemia.  - Recent labs reviewed with him, showing normal renal function.  - I have re-counseled the patient on diet management and  and weight loss by adopting a carbohydrate restricted / protein rich  Diet.  He presents with fluctuating body weight.  - he acknowledges that there is a room for improvement in his food and drink choices. - Suggestion is made for him to avoid simple carbohydrates  from his diet including Cakes, Sweet Desserts, Ice Cream, Soda (diet and regular), Sweet Tea, Candies, Chips, Cookies, Store Bought Juices, Alcohol in Excess of  1-2 drinks a day, Artificial Sweeteners,  Coffee Creamer, and "Sugar-free" Products, Lemonade. This will help patient to have more stable blood glucose profile and potentially avoid unintended weight gain. - Patient is advised to stick to a routine mealtimes to eat 3 meals  a day and avoid unnecessary snacks ( to snack only to correct hypoglycemia).  - I have approached patient with the following individualized plan to manage diabetes and patient agrees.  -In light of his presentation with A1c of 6%, he would benefit from staying on Trulicity 1.5 mg subcutaneously weekly.  He is advised to discontinue metformin 500 mg XR p.o. daily at breakfast.   He will not need insulin nor glipizide at this time.    - Patient specific target  for A1c; LDL, HDL, Triglycerides, and  Waist Circumference were discussed in detail.  2) BP/HTN:  -His blood pressure is controlled to target.   He is advised to continue his current blood pressure medications including lisinopril-hydrochlorthiazide 20-25 mg p.o. daily.    3) Lipids/HPL: His recent lipid panel revealed uncontrolled LDL at 114.  He is advised to continue pravastatin 40 mg p.o. nightly.  Side effects and precautions discussed with him.   4)   Weight/Diet: His BMI is 39.4-a candidate for modest weight loss.  CDE consult in progress, exercise, and carbohydrates information provided.  5) Chronic Care/Health Maintenance:  -Patient is on ACEI/ARB and Statin medications and encouraged to continue to follow up with Ophthalmology, Podiatrist at least yearly or according to recommendations, and advised to  stay away from smoking. I have recommended  yearly flu vaccine and pneumonia vaccination at least every 5 years; moderate intensity exercise for up to 150 minutes weekly; and  sleep for at least 7 hours a day.  History screening ABI was negative for PAD in March 2022.  His study will be repeated in March 2027, or sooner if needed.   - I advised patient to maintain close follow up with Caryl Bis, MD for primary care needs.   I spent 32 minutes in the care of the patient today including review of labs from [place], Lipids, Thyroid Function, Hematology (current and previous including abstractions from other facilities); face-to-face time discussing  his blood glucose readings/logs, discussing hypoglycemia and hyperglycemia episodes and symptoms, medications doses, his options of short and long term treatment based on the latest standards of care / guidelines;  discussion about incorporating lifestyle medicine;  and documenting the encounter. Risk reduction counseling performed per USPSTF guidelines to reduce obesity and cardiovascular risk factors.     Please refer to Patient Instructions for Blood Glucose Monitoring and Insulin/Medications Dosing Guide"  in media tab for additional information. Please  also refer to " Patient Self Inventory" in the Media  tab for reviewed elements of pertinent patient history.  [facility] participated in the discussions, expressed understanding, and voiced agreement with the above plans.  All questions were answered to his satisfaction. he is encouraged to contact clinic should he have any questions or  concerns prior to his return visit.    Follow up plan: -Return in about 6 months (around 02/26/2023) for F/U with Pre-visit Labs, A1c -NV.  Glade Lloyd, MD Phone: [phone]  Fax: [phone]  -  This note was partially dictated with voice recognition software. Similar sounding words can be transcribed inadequately or may not  be corrected upon review.  08/26/2022, 3:40 PM

## 2022-08-27 NOTE — Progress Notes (Signed)
Subjective:  Patient ID: Calvin Shields, male    DOB: July 20, 1956,  MRN: [id number]  66 y.o. male presents with at risk foot care. Patient has h/o NIDDM, neuropathy with amputation of partial amputation of left great toe and painful porokeratotic lesion(s) right heel and painful mycotic toenails that limit ambulation. Painful toenails interfere with ambulation. Aggravating factors include wearing enclosed shoe gear. Pain is relieved with periodic professional debridement. Painful porokeratotic lesions are aggravated when weightbearing with and without shoegear. Pain is relieved with periodic professional debridement..    Patient states blood glucose was 92 mg/dl today. Last known  HgA1c was 5.8%.    New problem(s): None    PCP: Richardean Chimera, MD and last visit was: May 10, 2022.  Patient states he lost both parents and two brothers in a tragedy in [place] a few months ago.  Review of Systems: Negative except as noted in the HPI.   Allergies  Allergen Reactions   Codeine Nausea And Vomiting   Aspirin Other (See Comments)    Other reaction(s): Abdominal Pain, Other (See Comments)    Ibuprofen Other (See Comments)    Other reaction(s): Abdominal Pain, Other (See Comments)     Objective:  There were no vitals filed for this visit. Constitutional Patient is a pleasant 66 y.o. African American male obese in NAD. AAO x 3.  Vascular Capillary fill time to digits I<3 seconds b/l.  DP pulse(s) are palpable b/l lower extremities. PT pulses faintly palpable b/l. Pedal hair absent. Lower extremity skin temperature gradient within normal limits. No pain with calf compression b/l. Trace edema noted b/l lower extremities. No cyanosis or clubbing noted. No ischemia or gangrene noted b/l LE.  Neurologic Protective sensation diminished with 10g monofilament b/l. Vibratory sensation diminished b/l.  Dermatologic Pedal skin is warm and supple b/l.  No open wounds b/l lower extremities. No interdigital  macerations b/l lower extremities. Toenails 1-5 right, 2-5 left elongated, discolored, dystrophic, thickened, crumbly with subungual debris and tenderness to dorsal palpation. Porokeratotic lesion(s) plantar heel pad of right foot. No erythema, no edema, no drainage, no fluctuance. Preulcerative lesion noted L 2nd toe. There is visible subdermal hemorrhage. There is no surrounding erythema, no edema, no drainage, no odor, no fluctuance.  Orthopedic: Normal muscle strength 5/5 to all lower extremity muscle groups bilaterally. Lower extremity amputation(s): partial amputation of L hallux. Clawtoe deformity L 2nd toe. Pes planus deformity noted bilateral LE. Patient ambulates independent of any assistive aids.      Latest Ref Rng & Units 08/26/2022    2:56 PM 04/01/2022    2:01 PM 10/01/2021    2:20 PM  Hemoglobin A1C  Hemoglobin-A1c 0.0 - 7.0 % 6.0  6.4  5.8    Assessment:   1. Pain due to onychomycosis of toenails of both feet   2. Pre-ulcerative corn or callous   3. Porokeratosis   4. Status post amputation of great toe, left (HCC)   5. Diabetic peripheral neuropathy associated with type 2 diabetes mellitus (HCC)    Plan:  Patient was evaluated and treated and all questions answered. Consent given for treatment as described below: -Examined patient. -Patient to continue soft, supportive shoe gear daily. -Mycotic toenails 2-5 bilaterally and R hallux were debrided in length and girth with sterile nail nippers and dremel without iatrogenic bleeding. -Preulcerative lesion pared L 2nd toe. Total number pared=1. -Porokeratotic lesion(s) right heel pared and enucleated with sterile scalpel blade without incident. Total number of lesions debrided=1. -Patient/POA to call should  there be question/concern in the interim.  Return in about 3 months (around 11/19/2022).  Freddie Breech, DPM

## 2022-09-09 DIAGNOSIS — Z96651 Presence of right artificial knee joint: Secondary | ICD-10-CM | POA: Diagnosis not present

## 2022-09-09 DIAGNOSIS — Z6834 Body mass index (BMI) 34.0-34.9, adult: Secondary | ICD-10-CM | POA: Diagnosis not present

## 2022-09-09 DIAGNOSIS — M25562 Pain in left knee: Secondary | ICD-10-CM | POA: Diagnosis not present

## 2022-09-09 DIAGNOSIS — M170 Bilateral primary osteoarthritis of knee: Secondary | ICD-10-CM | POA: Diagnosis not present

## 2022-09-09 DIAGNOSIS — G8929 Other chronic pain: Secondary | ICD-10-CM | POA: Diagnosis not present

## 2022-09-30 DIAGNOSIS — Z125 Encounter for screening for malignant neoplasm of prostate: Secondary | ICD-10-CM | POA: Diagnosis not present

## 2022-09-30 DIAGNOSIS — D519 Vitamin B12 deficiency anemia, unspecified: Secondary | ICD-10-CM | POA: Diagnosis not present

## 2022-09-30 DIAGNOSIS — E1142 Type 2 diabetes mellitus with diabetic polyneuropathy: Secondary | ICD-10-CM | POA: Diagnosis not present

## 2022-09-30 DIAGNOSIS — I10 Essential (primary) hypertension: Secondary | ICD-10-CM | POA: Diagnosis not present

## 2022-09-30 DIAGNOSIS — D649 Anemia, unspecified: Secondary | ICD-10-CM | POA: Diagnosis not present

## 2022-09-30 DIAGNOSIS — R5382 Chronic fatigue, unspecified: Secondary | ICD-10-CM | POA: Diagnosis not present

## 2022-09-30 DIAGNOSIS — E7849 Other hyperlipidemia: Secondary | ICD-10-CM | POA: Diagnosis not present

## 2022-09-30 DIAGNOSIS — Z23 Encounter for immunization: Secondary | ICD-10-CM | POA: Diagnosis not present

## 2022-10-07 DIAGNOSIS — Z0001 Encounter for general adult medical examination with abnormal findings: Secondary | ICD-10-CM | POA: Diagnosis not present

## 2022-10-07 DIAGNOSIS — N1831 Chronic kidney disease, stage 3a: Secondary | ICD-10-CM | POA: Diagnosis not present

## 2022-10-07 DIAGNOSIS — E7849 Other hyperlipidemia: Secondary | ICD-10-CM | POA: Diagnosis not present

## 2022-10-07 DIAGNOSIS — N400 Enlarged prostate without lower urinary tract symptoms: Secondary | ICD-10-CM | POA: Diagnosis not present

## 2022-10-07 DIAGNOSIS — Z6835 Body mass index (BMI) 35.0-35.9, adult: Secondary | ICD-10-CM | POA: Diagnosis not present

## 2022-10-07 DIAGNOSIS — I10 Essential (primary) hypertension: Secondary | ICD-10-CM | POA: Diagnosis not present

## 2022-10-07 DIAGNOSIS — Z23 Encounter for immunization: Secondary | ICD-10-CM | POA: Diagnosis not present

## 2022-10-07 DIAGNOSIS — M1712 Unilateral primary osteoarthritis, left knee: Secondary | ICD-10-CM | POA: Diagnosis not present

## 2022-10-14 DIAGNOSIS — Z23 Encounter for immunization: Secondary | ICD-10-CM | POA: Diagnosis not present

## 2022-10-29 DIAGNOSIS — E782 Mixed hyperlipidemia: Secondary | ICD-10-CM | POA: Diagnosis not present

## 2022-10-29 DIAGNOSIS — E1142 Type 2 diabetes mellitus with diabetic polyneuropathy: Secondary | ICD-10-CM | POA: Diagnosis not present

## 2022-10-29 DIAGNOSIS — K219 Gastro-esophageal reflux disease without esophagitis: Secondary | ICD-10-CM | POA: Diagnosis not present

## 2022-10-29 DIAGNOSIS — I10 Essential (primary) hypertension: Secondary | ICD-10-CM | POA: Diagnosis not present

## 2022-11-25 DIAGNOSIS — G8929 Other chronic pain: Secondary | ICD-10-CM | POA: Diagnosis not present

## 2022-11-25 DIAGNOSIS — M170 Bilateral primary osteoarthritis of knee: Secondary | ICD-10-CM | POA: Diagnosis not present

## 2022-11-25 DIAGNOSIS — Z96651 Presence of right artificial knee joint: Secondary | ICD-10-CM | POA: Diagnosis not present

## 2022-11-25 DIAGNOSIS — M25562 Pain in left knee: Secondary | ICD-10-CM | POA: Diagnosis not present

## 2022-12-02 ENCOUNTER — Ambulatory Visit: Payer: Medicare HMO | Admitting: Podiatry

## 2022-12-16 ENCOUNTER — Ambulatory Visit (INDEPENDENT_AMBULATORY_CARE_PROVIDER_SITE_OTHER): Payer: Medicare HMO | Admitting: Podiatry

## 2022-12-16 ENCOUNTER — Encounter: Payer: Self-pay | Admitting: Podiatry

## 2022-12-16 VITALS — BP 133/64

## 2022-12-16 DIAGNOSIS — M79675 Pain in left toe(s): Secondary | ICD-10-CM | POA: Diagnosis not present

## 2022-12-16 DIAGNOSIS — L84 Corns and callosities: Secondary | ICD-10-CM

## 2022-12-16 DIAGNOSIS — M79674 Pain in right toe(s): Secondary | ICD-10-CM

## 2022-12-16 DIAGNOSIS — Z89412 Acquired absence of left great toe: Secondary | ICD-10-CM

## 2022-12-16 DIAGNOSIS — E1142 Type 2 diabetes mellitus with diabetic polyneuropathy: Secondary | ICD-10-CM | POA: Diagnosis not present

## 2022-12-16 DIAGNOSIS — B351 Tinea unguium: Secondary | ICD-10-CM | POA: Diagnosis not present

## 2022-12-16 DIAGNOSIS — Q828 Other specified congenital malformations of skin: Secondary | ICD-10-CM

## 2022-12-16 NOTE — Progress Notes (Unsigned)
  Subjective:  Patient ID: Calvin Shields, male    DOB: 05-Dec-1956,  MRN: [id number]  Calvin Shields presents to clinic today for {jgcomplaint:23593}  Chief Complaint  Patient presents with   Nail Problem    DFC BS-92 A1C-6.5 PCP-Daniel,Terry PCP VST- 1 month ago   New problem(s): None. {jgcomplaint:23593}  PCP is Richardean Chimera, MD.  Allergies  Allergen Reactions   Codeine Nausea And Vomiting   Aspirin Other (See Comments)    Other reaction(s): Abdominal Pain, Other (See Comments)    Ibuprofen Other (See Comments)    Other reaction(s): Abdominal Pain, Other (See Comments)     Review of Systems: Negative except as noted in the HPI.  Objective: No changes noted in today's physical examination. There were no vitals filed for this visit.  Calvin Shields is a pleasant 66 y.o. male {jgbodyhabitus:24098} AAO x 3.   Vascular Capillary fill time to digits I<3 seconds b/l.  DP pulse(s) are palpable b/l lower extremities. PT pulses faintly palpable b/l. Pedal hair absent. Lower extremity skin temperature gradient within normal limits. No pain with calf compression b/l. Trace edema noted b/l lower extremities. No cyanosis or clubbing noted. No ischemia or gangrene noted b/l LE.  Neurologic Protective sensation diminished with 10g monofilament b/l. Vibratory sensation diminished b/l.  Dermatologic Pedal skin is warm and supple b/l.  No open wounds b/l lower extremities. No interdigital macerations b/l lower extremities. Toenails 1-5 right, 2-5 left elongated, discolored, dystrophic, thickened, crumbly with subungual debris and tenderness to dorsal palpation. Porokeratotic lesion(s) plantar heel pad of right foot. No erythema, no edema, no drainage, no fluctuance. Preulcerative lesion noted L 2nd toe. There is visible subdermal hemorrhage. There is no surrounding erythema, no edema, no drainage, no odor, no fluctuance.  Orthopedic: Normal muscle strength 5/5 to all lower extremity  muscle groups bilaterally. Lower extremity amputation(s): partial amputation of L hallux. Clawtoe deformity L 2nd toe. Pes planus deformity noted bilateral LE. Patient ambulates independent of any assistive aids.   Assessment/Plan: 1. Pain due to onychomycosis of toenails of both feet   2. Pre-ulcerative corn or callous   3. Porokeratosis   4. Status post amputation of great toe, left (HCC)   5. Diabetic peripheral neuropathy associated with type 2 diabetes mellitus (HCC)     No orders of the defined types were placed in this encounter.   None {Jgplan:23602::"-Patient/POA to call should there be question/concern in the interim."}   Return in about 3 months (around 03/17/2023).  Freddie Breech, DPM

## 2023-02-03 DIAGNOSIS — D649 Anemia, unspecified: Secondary | ICD-10-CM | POA: Diagnosis not present

## 2023-02-03 DIAGNOSIS — D519 Vitamin B12 deficiency anemia, unspecified: Secondary | ICD-10-CM | POA: Diagnosis not present

## 2023-02-03 DIAGNOSIS — M109 Gout, unspecified: Secondary | ICD-10-CM | POA: Diagnosis not present

## 2023-02-03 DIAGNOSIS — E782 Mixed hyperlipidemia: Secondary | ICD-10-CM | POA: Diagnosis not present

## 2023-02-03 DIAGNOSIS — K219 Gastro-esophageal reflux disease without esophagitis: Secondary | ICD-10-CM | POA: Diagnosis not present

## 2023-02-03 DIAGNOSIS — N1831 Chronic kidney disease, stage 3a: Secondary | ICD-10-CM | POA: Diagnosis not present

## 2023-02-03 DIAGNOSIS — D529 Folate deficiency anemia, unspecified: Secondary | ICD-10-CM | POA: Diagnosis not present

## 2023-02-03 DIAGNOSIS — E1142 Type 2 diabetes mellitus with diabetic polyneuropathy: Secondary | ICD-10-CM | POA: Diagnosis not present

## 2023-02-03 DIAGNOSIS — E7849 Other hyperlipidemia: Secondary | ICD-10-CM | POA: Diagnosis not present

## 2023-02-10 DIAGNOSIS — Z6837 Body mass index (BMI) 37.0-37.9, adult: Secondary | ICD-10-CM | POA: Diagnosis not present

## 2023-02-10 DIAGNOSIS — N400 Enlarged prostate without lower urinary tract symptoms: Secondary | ICD-10-CM | POA: Diagnosis not present

## 2023-02-10 DIAGNOSIS — R4582 Worries: Secondary | ICD-10-CM | POA: Diagnosis not present

## 2023-02-10 DIAGNOSIS — M1712 Unilateral primary osteoarthritis, left knee: Secondary | ICD-10-CM | POA: Diagnosis not present

## 2023-02-10 DIAGNOSIS — E7849 Other hyperlipidemia: Secondary | ICD-10-CM | POA: Diagnosis not present

## 2023-02-10 DIAGNOSIS — N1831 Chronic kidney disease, stage 3a: Secondary | ICD-10-CM | POA: Diagnosis not present

## 2023-02-10 DIAGNOSIS — I10 Essential (primary) hypertension: Secondary | ICD-10-CM | POA: Diagnosis not present

## 2023-02-17 ENCOUNTER — Ambulatory Visit: Payer: Medicare HMO | Admitting: "Endocrinology

## 2023-02-24 ENCOUNTER — Ambulatory Visit (INDEPENDENT_AMBULATORY_CARE_PROVIDER_SITE_OTHER): Payer: Medicare HMO

## 2023-02-24 ENCOUNTER — Other Ambulatory Visit (INDEPENDENT_AMBULATORY_CARE_PROVIDER_SITE_OTHER): Payer: Medicare HMO | Admitting: Podiatry

## 2023-02-24 DIAGNOSIS — M2042 Other hammer toe(s) (acquired), left foot: Secondary | ICD-10-CM

## 2023-02-24 DIAGNOSIS — L84 Corns and callosities: Secondary | ICD-10-CM

## 2023-02-24 DIAGNOSIS — Z89412 Acquired absence of left great toe: Secondary | ICD-10-CM

## 2023-02-24 DIAGNOSIS — E1142 Type 2 diabetes mellitus with diabetic polyneuropathy: Secondary | ICD-10-CM

## 2023-02-24 DIAGNOSIS — M2041 Other hammer toe(s) (acquired), right foot: Secondary | ICD-10-CM

## 2023-02-24 DIAGNOSIS — Z8631 Personal history of diabetic foot ulcer: Secondary | ICD-10-CM

## 2023-02-24 NOTE — Progress Notes (Signed)
1. Diabetic peripheral neuropathy associated with type 2 diabetes mellitus ([place])   2. Status post amputation of great toe, left ([place])   3. History of diabetic ulcer of foot   4. Pre-ulcerative corn or callous    Orders Placed This Encounter  Procedures   For Home Use Only DME Diabetic Shoe    Dispense one pair extra depth shoes and 3 pair custom insoles.

## 2023-02-24 NOTE — Progress Notes (Signed)
Patient presents to the office today for diabetic shoe and insole measuring.  Patient was measured with brannock device to determine size and width for 1 pair of extra depth shoes and foam casted for 3 pair of insoles.   ABN signed.   Documentation of medical necessity will be sent to patient's treating diabetic doctor to verify and sign.   Patient's diabetic provider: Cassandria Anger, MD   Shoes and insoles will be ordered at that time and patient will be notified for an appointment for fitting when they arrive.   Patient shoe selection-   1st   Shoe choice:   [id number]  Shoe size ordered: 40M

## 2023-02-25 ENCOUNTER — Encounter: Payer: Self-pay | Admitting: Internal Medicine

## 2023-03-04 ENCOUNTER — Ambulatory Visit (INDEPENDENT_AMBULATORY_CARE_PROVIDER_SITE_OTHER): Payer: Medicare HMO | Admitting: Gastroenterology

## 2023-03-04 ENCOUNTER — Encounter: Payer: Self-pay | Admitting: Gastroenterology

## 2023-03-04 ENCOUNTER — Telehealth (INDEPENDENT_AMBULATORY_CARE_PROVIDER_SITE_OTHER): Payer: Self-pay | Admitting: Internal Medicine

## 2023-03-04 ENCOUNTER — Ambulatory Visit: Payer: Medicare HMO | Admitting: Gastroenterology

## 2023-03-04 VITALS — BP 124/71 | HR 96 | Temp 97.1°F | Ht 69.0 in | Wt 251.3 lb

## 2023-03-04 DIAGNOSIS — Z86010 Personal history of colonic polyps: Secondary | ICD-10-CM | POA: Diagnosis not present

## 2023-03-04 DIAGNOSIS — D508 Other iron deficiency anemias: Secondary | ICD-10-CM

## 2023-03-04 LAB — CBC
HCT: 34.2 % — ABNORMAL LOW (ref 38.5–50.0)
Hemoglobin: 11.4 g/dL — ABNORMAL LOW (ref 13.2–17.1)
MCH: 28.1 pg (ref 27.0–33.0)
MCHC: 33.3 g/dL (ref 32.0–36.0)
MCV: 84.2 fL (ref 80.0–100.0)
MPV: 9.7 fL (ref 7.5–12.5)
Platelets: 396 10*3/uL (ref 140–400)
RBC: 4.06 Million/uL — ABNORMAL LOW (ref 4.20–5.80)
RDW: 14.8 % (ref 11.0–15.0)
WBC: 8.2 10*3/uL (ref 3.8–10.8)

## 2023-03-04 MED ORDER — PANTOPRAZOLE SODIUM 40 MG PO TBEC: 40.0000 mg | DELAYED_RELEASE_TABLET | Freq: Every day | ORAL | 3 refills | Status: DC

## 2023-03-04 MED ORDER — PEG 3350-KCL-NA BICARB-NACL 420 G PO SOLR: 4000.0000 mL | Freq: Once | ORAL | 0 refills | Status: AC

## 2023-03-04 NOTE — H&P (View-Only) (Signed)
  GI Office Note    Referring Provider: Daniel, Terry G, MD Primary Care Physician:  Daniel, Terry G, MD  Primary Gastroenterologist: Charles K. Carver, DO   Chief Complaint   Chief Complaint  Patient presents with   New Patient (Initial Visit)    Patient here today as a new patient due to having issues with dark stools and at times he has seen bright red spots. He says pcp told him he had low iron. He does not take any Fe pills. He says he has been feeling fatigued and sleepy,has occasional windedness, denies any dizziness,or chest pain. He says he had his labs done a month ago with Dr. Daniels.       History of Present Illness   Calvin Shields is a 67 y.o. male presenting today at the request of Daniel, Terry G, MD for iron deficiency anemia  Colonoscopy in February 2020 performed at [facility]: -7 mm adenomatous appearing semipedunculated polyp in the sigmoid colon -Pathology report unavailable.  Labs in February 2024: iron 36, iron sat 11%, ferritin 24, normal B12 and folate.  Hemoglobin 11.8 in July 2023.  Today: Has been having some darker stools for the last 3 weeks. At times he has also seen some bright red blood. Has been occurring about every other day. No abdominal pain, N/V, acid reflux, dysphagia. No PICA. Has [place] been having some more fatigue and weak and very sleepy the last 3 weeks. No chest pain. Mild shortness of breath. No restless leg. Has a good appetite. Does not eat a lot of grease. Had his gallbladder removed last year and does not eat a lot of greasy foods as that will make him sick. No smoking, drugs, or alcohol. No vaping. Reports he fesl different recently du eot his fatigue. Does not do strenuous work.   Has not taken any Advil recently but does not take it often. Primarily takes tylenol and tylenol bottle will ast for a year. Has taken advil in the oast for back pain.    Current Outpatient Medications  Medication Sig Dispense Refill   allopurinol  (ZYLOPRIM) 100 MG tablet Take 300 mg by mouth daily.     aspirin EC 81 MG tablet Take 81 mg by mouth daily.     atorvastatin (LIPITOR) 40 MG tablet Take 40 mg by mouth daily.     cadexomer iodine (IODOSORB) 0.9 % gel Apply to left 3rd toe once daily. 40 g 0   colchicine 0.6 MG tablet Take by mouth.     Dulaglutide (TRULICITY) 1.5 MG/0.5ML SOPN Inject 1.5 mg into the skin once a week. 2 mL 3   famotidine (PEPCID) 20 MG tablet Take 20 mg by mouth daily. prn     gabapentin (NEURONTIN) 300 MG capsule Take 300 mg by mouth 2 (two) times daily.     lisinopril-hydrochlorothiazide (PRINZIDE,ZESTORETIC) 20-25 MG per tablet Take 1 tablet by mouth daily.     metFORMIN (GLUCOPHAGE-XR) 500 MG 24 hr tablet 1,000 mg daily with breakfast.     Multiple Vitamin (MULTIVITAMIN WITH MINERALS) TABS tablet Take 1 tablet by mouth daily.     tamsulosin (FLOMAX) 0.4 MG CAPS capsule Take 0.4 mg by mouth 2 (two) times daily.     traMADol (ULTRAM) 50 MG tablet Take 50-100 mg by mouth 4 (four) times daily as needed.     diclofenac Sodium (VOLTAREN) 1 % GEL Apply topically. (Patient not taking: Reported on 03/04/2023)     fluticasone (FLONASE) 50 MCG/ACT nasal spray        No current facility-administered medications for this visit.    Past Medical History:  Diagnosis Date   Diabetes mellitus without complication (HCC)    Hyperlipidemia    Hypertension     Past Surgical History:  Procedure Laterality Date   AMPUTATION Left 09/08/2017   Procedure: PARTIAL TOE AMPUTATION LEFT HALLUX;  Surgeon: Patel, Prayashkumar, DPM;  Location: [facility];  Service: Podiatry;  Laterality: Left;   right leg surgery       Family History  Problem Relation Age of Onset   Diabetes Mother     Allergies as of 03/04/2023 - Review Complete 03/04/2023  Allergen Reaction Noted   Codeine Nausea And Vomiting 06/27/2014   Aspirin Other (See Comments) 11/09/2013   Ibuprofen Other (See Comments) 11/09/2013    Social History   Socioeconomic  History   Marital status: Married    Spouse name: Not on file   Number of children: Not on file   Years of education: Not on file   Highest education level: Not on file  Occupational History   Not on file  Tobacco Use   Smoking status: Never   Smokeless tobacco: Never  Vaping Use   Vaping Use: Never used  Substance and Sexual Activity   Alcohol use: No   Drug use: No   Sexual activity: Not on file  Other Topics Concern   Not on file  Social History Narrative   Not on file   Social Determinants of Health   Financial Resource Strain: Not on file  Food Insecurity: Not on file  Transportation Needs: Not on file  Physical Activity: Not on file  Stress: Not on file  Social Connections: Not on file  Intimate Partner Violence: Not on file     Review of Systems   Gen: Denies any fever, chills, fatigue, weight loss, lack of appetite.  CV: Denies chest pain, heart palpitations, peripheral edema, syncope.  Resp: Denies shortness of breath at rest or with exertion. Denies wheezing or cough.  GI: see HPI GU : Denies urinary burning, urinary frequency, urinary hesitancy MS: Denies joint pain, muscle weakness, cramps, or limitation of movement.  Derm: Denies rash, itching, dry skin Psych: Denies depression, anxiety, memory loss, and confusion Heme: Denies bruising, bleeding, and enlarged lymph nodes.   Physical Exam   BP 124/71 (BP Location: Left Arm, Patient Position: Sitting, Cuff Size: Large)   Pulse 96   Temp (!) 97.1 F (36.2 C) (Temporal)   Ht 5' 9" (1.753 m)   Wt 251 lb 4.8 oz (114 kg)   BMI 37.11 kg/m   General:   Alert and oriented. Pleasant and cooperative. Well-nourished and well-developed.  Head:  Normocephalic and atraumatic. Eyes:  Without icterus, sclera clear and conjunctiva pink.  Ears:  Normal auditory acuity. Mouth:  No deformity or lesions, oral mucosa pink.  Lungs:  Clear to auscultation bilaterally. No wheezes, rales, or rhonchi. No distress.   Heart:  S1, S2 present without murmurs appreciated.  Abdomen:  +BS, soft, non-tender and non-distended. No HSM noted. No guarding or rebound. No masses appreciated.  Rectal:  Deferred  Msk:  Symmetrical without gross deformities. Normal posture. Extremities:  Without edema. Neurologic:  Alert and  oriented x4;  grossly normal neurologically. Skin:  Intact without significant lesions or rashes. Psych:  Alert and cooperative. Normal mood and affect.   Assessment   Tyqwan Micciche is a 67 y.o. male with a history of diabetes, HLD, HTN presenting today for evaluation of iron deficiency anemia.     Iron deficiency anemia, fatigue: Recent labs with low iron, saturation, and ferritin.  Has been increasing fatigue as well as some melena and bright red blood per rectum.  Denies any constipation, diarrhea, nausea, vomiting, dysphagia, lack of appetite, unintentional weight loss, early satiety.  Also denies any overt acid reflux but has been maintained on famotidine.  Instructed patient to begin taking pantoprazole 40 mg once daily in the setting of possible GI bleed.  We will check CBC today given he had iron panel completed but not a CBC.  We will schedule for EGD and colonoscopy for evaluation of anemia.  Despite a fairly recent colonoscopy in 2020 he has been experiencing some rectal bleeding therefore we will repeat.  Will consider starting iron supplement pending hemoglobin results and timing of colonoscopy as he will need to hold for 7 days prior to colonoscopy.  We will evaluate for possible esophagitis, gastritis, duodenitis, peptic ulcer disease, AVMs, polyps, malignancy.  History of colon polyps: Last colonoscopy in February 2020 with single polyp removed in the sigmoid colon.  Pathology report not available.  No recommended repeat available on report.  We are proceeding with  PLAN   Proceed with upper endoscopy and colonoscopy with propofol by Dr. Carver in near future: the risks, benefits, and  alternatives have been discussed with the patient in detail. The patient states understanding and desires to proceed. ASA 3 Hold trulicity for 1 week Hold metformin night prior and morning of.  CBC May start iron supplement after colonoscopy or before pending timing and results of labs Start pantoprazole 40 mg once daily.  Follow up in 2-3 months.    Katniss Weedman, MSN, FNP-BC, AGACNP-BC [facility] 

## 2023-03-04 NOTE — Telephone Encounter (Signed)
Attempted to contact pt to schedule EGD/TCS Dr.Carver ASA 3. Pt prefers Fridays. Hold metformin night prior and morning of; hold Trulicity for one week. Will need BMET. Left message to return call.

## 2023-03-04 NOTE — Progress Notes (Signed)
GI Office Note    Referring Provider: Caryl Bis, MD Primary Care Physician:  Caryl Bis, MD  Primary Gastroenterologist: Elon Alas. Abbey Chatters, DO   Chief Complaint   Chief Complaint  Patient presents with   New Patient (Initial Visit)    Patient here today as a new patient due to having issues with dark stools and at times he has seen bright red spots. He says pcp told him he had low iron. He does not take any Fe pills. He says he has been feeling fatigued and sleepy,has occasional windedness, denies any dizziness,or chest pain. He says he had his labs done a month ago with Dr. Olena Heckle.       History of Present Illness   Calvin Shields is a 67 y.o. male presenting today at the request of Caryl Bis, MD for iron deficiency anemia  Colonoscopy in February 2020 performed at [facility]: -7 mm adenomatous appearing semipedunculated polyp in the sigmoid colon -Pathology report unavailable.  Labs in February 2024: iron 36, iron sat 11%, ferritin 24, normal B12 and folate.  Hemoglobin 11.8 in July 2023.  Today: Has been having some darker stools for the last 3 weeks. At times he has also seen some bright red blood. Has been occurring about every other day. No abdominal pain, N/V, acid reflux, dysphagia. No PICA. Has [place] been having some more fatigue and weak and very sleepy the last 3 weeks. No chest pain. Mild shortness of breath. No restless leg. Has a good appetite. Does not eat a lot of grease. Had his gallbladder removed last year and does not eat a lot of greasy foods as that will make him sick. No smoking, drugs, or alcohol. No vaping. Reports he fesl different recently du eot his fatigue. Does not do strenuous work.   Has not taken any Advil recently but does not take it often. Primarily takes tylenol and tylenol bottle will ast for a year. Has taken advil in the oast for back pain.    Current Outpatient Medications  Medication Sig Dispense Refill   allopurinol  (ZYLOPRIM) 100 MG tablet Take 300 mg by mouth daily.     aspirin EC 81 MG tablet Take 81 mg by mouth daily.     atorvastatin (LIPITOR) 40 MG tablet Take 40 mg by mouth daily.     cadexomer iodine (IODOSORB) 0.9 % gel Apply to left 3rd toe once daily. 40 g 0   colchicine 0.6 MG tablet Take by mouth.     Dulaglutide (TRULICITY) 1.5 0000000 SOPN Inject 1.5 mg into the skin once a week. 2 mL 3   famotidine (PEPCID) 20 MG tablet Take 20 mg by mouth daily. prn     gabapentin (NEURONTIN) 300 MG capsule Take 300 mg by mouth 2 (two) times daily.     lisinopril-hydrochlorothiazide (PRINZIDE,ZESTORETIC) 20-25 MG per tablet Take 1 tablet by mouth daily.     metFORMIN (GLUCOPHAGE-XR) 500 MG 24 hr tablet 1,000 mg daily with breakfast.     Multiple Vitamin (MULTIVITAMIN WITH MINERALS) TABS tablet Take 1 tablet by mouth daily.     tamsulosin (FLOMAX) 0.4 MG CAPS capsule Take 0.4 mg by mouth 2 (two) times daily.     traMADol (ULTRAM) 50 MG tablet Take 50-100 mg by mouth 4 (four) times daily as needed.     diclofenac Sodium (VOLTAREN) 1 % GEL Apply topically. (Patient not taking: Reported on 03/04/2023)     fluticasone (FLONASE) 50 MCG/ACT nasal spray  No current facility-administered medications for this visit.    Past Medical History:  Diagnosis Date   Diabetes mellitus without complication ([place])    Hyperlipidemia    Hypertension     Past Surgical History:  Procedure Laterality Date   AMPUTATION Left 09/08/2017   Procedure: PARTIAL TOE AMPUTATION LEFT HALLUX;  Surgeon: Tyson Babinski, DPM;  Location: [facility];  Service: Podiatry;  Laterality: Left;   right leg surgery       Family History  Problem Relation Age of Onset   Diabetes Mother     Allergies as of 03/04/2023 - Review Complete 03/04/2023  Allergen Reaction Noted   Codeine Nausea And Vomiting 06/27/2014   Aspirin Other (See Comments) 11/09/2013   Ibuprofen Other (See Comments) 11/09/2013    Social History   Socioeconomic  History   Marital status: Married    Spouse name: Not on file   Number of children: Not on file   Years of education: Not on file   Highest education level: Not on file  Occupational History   Not on file  Tobacco Use   Smoking status: Never   Smokeless tobacco: Never  Vaping Use   Vaping Use: Never used  Substance and Sexual Activity   Alcohol use: No   Drug use: No   Sexual activity: Not on file  Other Topics Concern   Not on file  Social History Narrative   Not on file   Social Determinants of Health   Financial Resource Strain: Not on file  Food Insecurity: Not on file  Transportation Needs: Not on file  Physical Activity: Not on file  Stress: Not on file  Social Connections: Not on file  Intimate Partner Violence: Not on file     Review of Systems   Gen: Denies any fever, chills, fatigue, weight loss, lack of appetite.  CV: Denies chest pain, heart palpitations, peripheral edema, syncope.  Resp: Denies shortness of breath at rest or with exertion. Denies wheezing or cough.  GI: see HPI GU : Denies urinary burning, urinary frequency, urinary hesitancy MS: Denies joint pain, muscle weakness, cramps, or limitation of movement.  Derm: Denies rash, itching, dry skin Psych: Denies depression, anxiety, memory loss, and confusion Heme: Denies bruising, bleeding, and enlarged lymph nodes.   Physical Exam   BP 124/71 (BP Location: Left Arm, Patient Position: Sitting, Cuff Size: Large)   Pulse 96   Temp (!) 97.1 F (36.2 C) (Temporal)   Ht '5\' 9"'$  (1.753 m)   Wt 251 lb 4.8 oz (114 kg)   BMI 37.11 kg/m   General:   Alert and oriented. Pleasant and cooperative. Well-nourished and well-developed.  Head:  Normocephalic and atraumatic. Eyes:  Without icterus, sclera clear and conjunctiva pink.  Ears:  Normal auditory acuity. Mouth:  No deformity or lesions, oral mucosa pink.  Lungs:  Clear to auscultation bilaterally. No wheezes, rales, or rhonchi. No distress.   Heart:  S1, S2 present without murmurs appreciated.  Abdomen:  +BS, soft, non-tender and non-distended. No HSM noted. No guarding or rebound. No masses appreciated.  Rectal:  Deferred  Msk:  Symmetrical without gross deformities. Normal posture. Extremities:  Without edema. Neurologic:  Alert and  oriented x4;  grossly normal neurologically. Skin:  Intact without significant lesions or rashes. Psych:  Alert and cooperative. Normal mood and affect.   Assessment   Calvin Shields is a 67 y.o. male with a history of diabetes, HLD, HTN presenting today for evaluation of iron deficiency anemia.  Iron deficiency anemia, fatigue: Recent labs with low iron, saturation, and ferritin.  Has been increasing fatigue as well as some melena and bright red blood per rectum.  Denies any constipation, diarrhea, nausea, vomiting, dysphagia, lack of appetite, unintentional weight loss, early satiety.  Also denies any overt acid reflux but has been maintained on famotidine.  Instructed patient to begin taking pantoprazole 40 mg once daily in the setting of possible GI bleed.  We will check CBC today given he had iron panel completed but not a CBC.  We will schedule for EGD and colonoscopy for evaluation of anemia.  Despite a fairly recent colonoscopy in 2020 he has been experiencing some rectal bleeding therefore we will repeat.  Will consider starting iron supplement pending hemoglobin results and timing of colonoscopy as he will need to hold for 7 days prior to colonoscopy.  We will evaluate for possible esophagitis, gastritis, duodenitis, peptic ulcer disease, AVMs, polyps, malignancy.  History of colon polyps: Last colonoscopy in February 2020 with single polyp removed in the sigmoid colon.  Pathology report not available.  No recommended repeat available on report.  We are proceeding with  PLAN   Proceed with upper endoscopy and colonoscopy with propofol by Dr. Abbey Chatters in near future: the risks, benefits, and  alternatives have been discussed with the patient in detail. The patient states understanding and desires to proceed. ASA 3 Hold trulicity for 1 week Hold metformin night prior and morning of.  CBC May start iron supplement after colonoscopy or before pending timing and results of labs Start pantoprazole 40 mg once daily.  Follow up in 2-3 months.    Venetia Night, MSN, FNP-BC, AGACNP-BC [facility]

## 2023-03-04 NOTE — Patient Instructions (Addendum)
We are scheduling you for a upper endoscopy and colonoscopy in the near future with Dr. Abbey Chatters. You will received separate detailed instructions on your prep.   We will obtain a CBC to check your hgb. This will hep determine the need for iron supplementation before or after your procedures.   I am sending in pantoprazole 40 mg once daily for you to take 30 minutes before breakfast.   We will follow up in 2 months.   It was a pleasure to see you today. I want to create trusting relationships with patients. If you receive a survey regarding your visit,  I greatly appreciate you taking time to fill this out on paper or through your MyChart. I value your feedback.  Venetia Night, MSN, FNP-BC, AGACNP-BC [facility]

## 2023-03-04 NOTE — Addendum Note (Signed)
Addended by: Vicente Males on: 03/04/2023 04:39 PM   Modules accepted: Orders

## 2023-03-04 NOTE — Telephone Encounter (Signed)
Pt left voicemail returning call.  Writer returned call and pt is scheduled for EGD/TCS with Dr.Carver 04/03/23. Pt will need pre-op; disregard last note that stated pt will need BMET. Prep sent to pharmacy. Instructions mailed to patient. Will call pt with pre op appt.

## 2023-03-06 ENCOUNTER — Telehealth: Payer: Self-pay | Admitting: *Deleted

## 2023-03-06 NOTE — Telephone Encounter (Signed)
Pt called to check on his pre-op date; Advised pt that his pre-op is scheduled for 03/26/23,arrive at 2:15 pm at [facility] understanding.

## 2023-03-17 ENCOUNTER — Ambulatory Visit: Payer: Medicare HMO | Admitting: Podiatry

## 2023-03-24 ENCOUNTER — Ambulatory Visit: Payer: Medicare HMO | Admitting: Podiatry

## 2023-03-24 ENCOUNTER — Ambulatory Visit: Payer: Medicare HMO | Admitting: Gastroenterology

## 2023-03-24 NOTE — Patient Instructions (Addendum)
[place]  03/24/2023     @PREFPERIOPPHARMACY @   Your procedure is scheduled on  04/03/2023.   Report to [facility] at  0600  A.M.   Call this number if you have problems the morning of surgery:  [phone]  If you experience any cold or flu symptoms such as cough, fever, chills, shortness of breath, etc. between now and your scheduled surgery, please notify us at the above number.   Remember:  Follow the diet and prep instructions given to you by the office.     Your last dose of dulaglutide should be on 03/25/2023.        DO NOT take any medications for diabetes the morning of your procedure.     Take these medicines the morning of surgery with A SIP OF WATER       allopurinol, pepcid, gabapentin, pantoprazole, flomax, tramadol.     Do not wear jewelry, make-up or nail polish.  Do not wear lotions, powders, or perfumes, or deodorant.  Do not shave 48 hours prior to surgery.  Men may shave face and neck.  Do not bring valuables to the hospital.  [facility] is not responsible for any belongings or valuables.  Contacts, dentures or bridgework may not be worn into surgery.  Leave your suitcase in the car.  After surgery it may be brought to your room.  For patients admitted to the hospital, discharge time will be determined by your treatment team.  Patients discharged the day of surgery will not be allowed to drive home and must have someone with them for 24 hours.    Special instructions:   DO NOT smoke tobacco or vape for 24 hours before your procedure.  Please read over the following fact sheets that you were given. Anesthesia Post-op Instructions and Care and Recovery After Surgery      Upper Endoscopy, Adult, Care After After the procedure, it is common to have a sore throat. It is also common to have: Mild stomach pain or discomfort. Bloating. Nausea. Follow these instructions at home: The instructions below may help you care for  yourself at home. Your health care provider may give you more instructions. If you have questions, ask your health care provider. If you were given a sedative during the procedure, it can affect you for several hours. Do not drive or operate machinery until your health care provider says that it is safe. If you will be going home right after the procedure, plan to have a responsible adult: Take you home from the hospital or clinic. You will not be allowed to drive. Care for you for the time you are told. Follow instructions from your health care provider about what you may eat and drink. Return to your normal activities as told by your health care provider. Ask your health care provider what activities are safe for you. Take over-the-counter and prescription medicines only as told by your health care provider. Contact a health care provider if you: Have a sore throat that lasts longer than one day. Have trouble swallowing. Have a fever. Get help right away if you: Vomit blood or your vomit looks like coffee grounds. Have bloody, black, or tarry stools. Have a very bad sore throat or you cannot swallow. Have difficulty breathing or very bad pain in your chest or abdomen. These symptoms may be an emergency. Get help right away. Call 911. Do not wait to see if the symptoms  will go away. Do not drive yourself to the hospital. Summary After the procedure, it is common to have a sore throat, mild stomach discomfort, bloating, and nausea. If you were given a sedative during the procedure, it can affect you for several hours. Do not drive until your health care provider says that it is safe. Follow instructions from your health care provider about what you may eat and drink. Return to your normal activities as told by your health care provider. This information is not intended to replace advice given to you by your health care provider. Make sure you discuss any questions you have with your health  care provider. Document Revised: 03/27/2022 Document Reviewed: 03/27/2022 Elsevier Patient Education  [place]. Colonoscopy, Adult, Care After The following information offers guidance on how to care for yourself after your procedure. Your health care provider may also give you more specific instructions. If you have problems or questions, contact your health care provider. What can I expect after the procedure? After the procedure, it is common to have: A small amount of blood in your stool for 24 hours after the procedure. Some gas. Mild cramping or bloating of your abdomen. Follow these instructions at home: Eating and drinking  Drink enough fluid to keep your urine pale yellow. Follow instructions from your health care provider about eating or drinking restrictions. Resume your normal diet as told by your health care provider. Avoid heavy or fried foods that are hard to digest. Activity Rest as told by your health care provider. Avoid sitting for a long time without moving. Get up to take short walks every 1-2 hours. This is important to improve blood flow and breathing. Ask for help if you feel weak or unsteady. Return to your normal activities as told by your health care provider. Ask your health care provider what activities are safe for you. Managing cramping and bloating  Try walking around when you have cramps or feel bloated. If directed, apply heat to your abdomen as told by your health care provider. Use the heat source that your health care provider recommends, such as a moist heat pack or a heating pad. Place a towel between your skin and the heat source. Leave the heat on for 20-30 minutes. Remove the heat if your skin turns bright red. This is especially important if you are unable to feel pain, heat, or cold. You have a greater risk of getting burned. General instructions If you were given a sedative during the procedure, it can affect you for several hours. Do  not drive or operate machinery until your health care provider says that it is safe. For the first 24 hours after the procedure: Do not sign important documents. Do not drink alcohol. Do your regular daily activities at a slower pace than normal. Eat soft foods that are easy to digest. Take over-the-counter and prescription medicines only as told by your health care provider. Keep all follow-up visits. This is important. Contact a health care provider if: You have blood in your stool 2-3 days after the procedure. Get help right away if: You have more than a small spotting of blood in your stool. You have large blood clots in your stool. You have swelling of your abdomen. You have nausea or vomiting. You have a fever. You have increasing pain in your abdomen that is not relieved with medicine. These symptoms may be an emergency. Get help right away. Call 911. Do not wait to see if the symptoms  will go away. Do not drive yourself to the hospital. Summary After the procedure, it is common to have a small amount of blood in your stool. You may also have mild cramping and bloating of your abdomen. If you were given a sedative during the procedure, it can affect you for several hours. Do not drive or operate machinery until your health care provider says that it is safe. Get help right away if you have a lot of blood in your stool, nausea or vomiting, a fever, or increased pain in your abdomen. This information is not intended to replace advice given to you by your health care provider. Make sure you discuss any questions you have with your health care provider. Document Revised: 08/08/2021 Document Reviewed: 08/08/2021 Elsevier Patient Education  [place] After The following information offers guidance on how to care for yourself after your procedure. Your health care provider may also give you more specific instructions. If you have problems or  questions, contact your health care provider. What can I expect after the procedure? After the procedure, it is common to have: Tiredness. Little or no memory about what happened during or after the procedure. Impaired judgment when it comes to making decisions. Nausea or vomiting. Some trouble with balance. Follow these instructions at home: For the time period you were told by your health care provider:  Rest. Do not participate in activities where you could fall or become injured. Do not drive or use machinery. Do not drink alcohol. Do not take sleeping pills or medicines that cause drowsiness. Do not make important decisions or sign legal documents. Do not take care of children on your own. Medicines Take over-the-counter and prescription medicines only as told by your health care provider. If you were prescribed antibiotics, take them as told by your health care provider. Do not stop using the antibiotic even if you start to feel better. Eating and drinking Follow instructions from your health care provider about what you may eat and drink. Drink enough fluid to keep your urine pale yellow. If you vomit: Drink clear fluids slowly and in small amounts as you are able. Clear fluids include water, ice chips, low-calorie sports drinks, and fruit juice that has water added to it (diluted fruit juice). Eat light and bland foods in small amounts as you are able. These foods include bananas, applesauce, rice, lean meats, toast, and crackers. General instructions  Have a responsible adult stay with you for the time you are told. It is important to have someone help care for you until you are awake and alert. If you have sleep apnea, surgery and some medicines can increase your risk for breathing problems. Follow instructions from your health care provider about wearing your sleep device: When you are sleeping. This includes during daytime naps. While taking prescription pain medicines,  sleeping medicines, or medicines that make you drowsy. Do not use any products that contain nicotine or tobacco. These products include cigarettes, chewing tobacco, and vaping devices, such as e-cigarettes. If you need help quitting, ask your health care provider. Contact a health care provider if: You feel nauseous or vomit every time you eat or drink. You feel light-headed. You are still sleepy or having trouble with balance after 24 hours. You get a rash. You have a fever. You have redness or swelling around the IV site. Get help right away if: You have trouble breathing. You have new confusion after you get home. These symptoms  may be an emergency. Get help right away. Call 911. Do not wait to see if the symptoms will go away. Do not drive yourself to the hospital. This information is not intended to replace advice given to you by your health care provider. Make sure you discuss any questions you have with your health care provider. Document Revised: 05/13/2022 Document Reviewed: 05/13/2022 Elsevier Patient Education  [place].

## 2023-03-26 ENCOUNTER — Telehealth: Payer: Self-pay | Admitting: *Deleted

## 2023-03-26 ENCOUNTER — Encounter (HOSPITAL_COMMUNITY): Payer: Self-pay

## 2023-03-26 ENCOUNTER — Encounter (HOSPITAL_COMMUNITY)
Admission: RE | Admit: 2023-03-26 | Discharge: 2023-03-26 | Disposition: A | Discharge status: Home / Self Care | Payer: Medicare HMO | Source: Ambulatory Visit | Attending: Internal Medicine | Admitting: Internal Medicine

## 2023-03-26 DIAGNOSIS — Z01818 Encounter for other preprocedural examination: Secondary | ICD-10-CM | POA: Insufficient documentation

## 2023-03-26 DIAGNOSIS — E119 Type 2 diabetes mellitus without complications: Secondary | ICD-10-CM | POA: Diagnosis not present

## 2023-03-26 DIAGNOSIS — I10 Essential (primary) hypertension: Secondary | ICD-10-CM | POA: Diagnosis not present

## 2023-03-26 LAB — BASIC METABOLIC PANEL
Anion gap: 10 (ref 5–15)
BUN: 18 mg/dL (ref 8–23)
CO2: 25 mmol/L (ref 22–32)
Calcium: 9.1 mg/dL (ref 8.9–10.3)
Chloride: 101 mmol/L (ref 98–111)
Creatinine, Ser: 1.15 mg/dL (ref 0.61–1.24)
GFR, Estimated: >60 mL/min (ref >60)
Glucose, Bld: 87 mg/dL (ref 70–99)
Potassium: 3.3 mmol/L — ABNORMAL LOW (ref 3.5–5.1)
Sodium: 136 mmol/L (ref 135–145)

## 2023-03-26 NOTE — Telephone Encounter (Signed)
PA approved via cohere "DOS: 03/31/2023 - 06/30/2023,  Authorization [id number]"

## 2023-03-30 DIAGNOSIS — E782 Mixed hyperlipidemia: Secondary | ICD-10-CM | POA: Diagnosis not present

## 2023-03-30 DIAGNOSIS — I10 Essential (primary) hypertension: Secondary | ICD-10-CM | POA: Diagnosis not present

## 2023-03-30 DIAGNOSIS — K219 Gastro-esophageal reflux disease without esophagitis: Secondary | ICD-10-CM | POA: Diagnosis not present

## 2023-03-30 DIAGNOSIS — E1142 Type 2 diabetes mellitus with diabetic polyneuropathy: Secondary | ICD-10-CM | POA: Diagnosis not present

## 2023-04-03 ENCOUNTER — Ambulatory Visit (HOSPITAL_COMMUNITY): Payer: Medicare HMO | Admitting: Certified Registered Nurse Anesthetist

## 2023-04-03 ENCOUNTER — Telehealth: Payer: Self-pay | Admitting: Internal Medicine

## 2023-04-03 ENCOUNTER — Other Ambulatory Visit: Payer: Self-pay

## 2023-04-03 ENCOUNTER — Encounter (HOSPITAL_COMMUNITY)
Admission: RE | Disposition: A | Discharge status: Home / Self Care | Payer: Self-pay | Source: Home / Self Care | Attending: Internal Medicine

## 2023-04-03 ENCOUNTER — Ambulatory Visit (HOSPITAL_BASED_OUTPATIENT_CLINIC_OR_DEPARTMENT_OTHER): Payer: Medicare HMO | Admitting: Certified Registered Nurse Anesthetist

## 2023-04-03 ENCOUNTER — Ambulatory Visit (HOSPITAL_COMMUNITY)
Admission: RE | Admit: 2023-04-03 | Discharge: 2023-04-03 | Disposition: A | Discharge status: Home / Self Care | Payer: Medicare HMO | Attending: Internal Medicine | Admitting: Internal Medicine

## 2023-04-03 ENCOUNTER — Encounter (HOSPITAL_COMMUNITY): Payer: Self-pay

## 2023-04-03 ENCOUNTER — Other Ambulatory Visit: Payer: Self-pay | Admitting: *Deleted

## 2023-04-03 DIAGNOSIS — R5383 Other fatigue: Secondary | ICD-10-CM | POA: Diagnosis not present

## 2023-04-03 DIAGNOSIS — D125 Benign neoplasm of sigmoid colon: Secondary | ICD-10-CM | POA: Diagnosis not present

## 2023-04-03 DIAGNOSIS — K449 Diaphragmatic hernia without obstruction or gangrene: Secondary | ICD-10-CM

## 2023-04-03 DIAGNOSIS — E119 Type 2 diabetes mellitus without complications: Secondary | ICD-10-CM | POA: Diagnosis not present

## 2023-04-03 DIAGNOSIS — Z7984 Long term (current) use of oral hypoglycemic drugs: Secondary | ICD-10-CM | POA: Insufficient documentation

## 2023-04-03 DIAGNOSIS — K2970 Gastritis, unspecified, without bleeding: Secondary | ICD-10-CM | POA: Insufficient documentation

## 2023-04-03 DIAGNOSIS — Z833 Family history of diabetes mellitus: Secondary | ICD-10-CM | POA: Insufficient documentation

## 2023-04-03 DIAGNOSIS — K649 Unspecified hemorrhoids: Secondary | ICD-10-CM | POA: Diagnosis not present

## 2023-04-03 DIAGNOSIS — Z6837 Body mass index (BMI) 37.0-37.9, adult: Secondary | ICD-10-CM | POA: Insufficient documentation

## 2023-04-03 DIAGNOSIS — J392 Other diseases of pharynx: Secondary | ICD-10-CM | POA: Insufficient documentation

## 2023-04-03 DIAGNOSIS — Z7985 Long-term (current) use of injectable non-insulin antidiabetic drugs: Secondary | ICD-10-CM | POA: Diagnosis not present

## 2023-04-03 DIAGNOSIS — D649 Anemia, unspecified: Secondary | ICD-10-CM | POA: Diagnosis not present

## 2023-04-03 DIAGNOSIS — D122 Benign neoplasm of ascending colon: Secondary | ICD-10-CM | POA: Insufficient documentation

## 2023-04-03 DIAGNOSIS — K5790 Diverticulosis of intestine, part unspecified, without perforation or abscess without bleeding: Secondary | ICD-10-CM

## 2023-04-03 DIAGNOSIS — K635 Polyp of colon: Secondary | ICD-10-CM

## 2023-04-03 DIAGNOSIS — R531 Weakness: Secondary | ICD-10-CM | POA: Insufficient documentation

## 2023-04-03 DIAGNOSIS — R0602 Shortness of breath: Secondary | ICD-10-CM | POA: Diagnosis not present

## 2023-04-03 DIAGNOSIS — K648 Other hemorrhoids: Secondary | ICD-10-CM | POA: Diagnosis not present

## 2023-04-03 DIAGNOSIS — D509 Iron deficiency anemia, unspecified: Secondary | ICD-10-CM | POA: Insufficient documentation

## 2023-04-03 DIAGNOSIS — K5730 Diverticulosis of large intestine without perforation or abscess without bleeding: Secondary | ICD-10-CM | POA: Diagnosis not present

## 2023-04-03 DIAGNOSIS — I10 Essential (primary) hypertension: Secondary | ICD-10-CM | POA: Insufficient documentation

## 2023-04-03 DIAGNOSIS — K319 Disease of stomach and duodenum, unspecified: Secondary | ICD-10-CM | POA: Diagnosis not present

## 2023-04-03 DIAGNOSIS — K921 Melena: Secondary | ICD-10-CM | POA: Insufficient documentation

## 2023-04-03 DIAGNOSIS — K219 Gastro-esophageal reflux disease without esophagitis: Secondary | ICD-10-CM | POA: Diagnosis not present

## 2023-04-03 HISTORY — PX: COLONOSCOPY WITH PROPOFOL: SHX5780

## 2023-04-03 HISTORY — PX: BIOPSY: SHX5522

## 2023-04-03 HISTORY — PX: POLYPECTOMY: SHX5525

## 2023-04-03 HISTORY — PX: ESOPHAGOGASTRODUODENOSCOPY (EGD) WITH PROPOFOL: SHX5813

## 2023-04-03 LAB — GLUCOSE, CAPILLARY: Glucose-Capillary: 124 mg/dL — ABNORMAL HIGH (ref 70–99)

## 2023-04-03 SURGERY — COLONOSCOPY WITH PROPOFOL
Anesthesia: General

## 2023-04-03 MED ORDER — LIDOCAINE HCL (PF) 2 % IJ SOLN
INTRAMUSCULAR | Status: AC
  Filled 2023-04-03: qty 5

## 2023-04-03 MED ORDER — GLYCOPYRROLATE PF 0.2 MG/ML IJ SOSY
PREFILLED_SYRINGE | INTRAMUSCULAR | Status: AC
  Filled 2023-04-03: qty 1

## 2023-04-03 MED ORDER — PROPOFOL 10 MG/ML IV BOLUS
INTRAVENOUS | Status: DC | PRN
  Administered 2023-04-03: 100 mg via INTRAVENOUS

## 2023-04-03 MED ORDER — GLYCOPYRROLATE 0.2 MG/ML IJ SOLN
INTRAMUSCULAR | Status: DC | PRN
  Administered 2023-04-03: .1 mg via INTRAVENOUS

## 2023-04-03 MED ORDER — LIDOCAINE HCL (CARDIAC) PF 100 MG/5ML IV SOSY
PREFILLED_SYRINGE | INTRAVENOUS | Status: DC | PRN
  Administered 2023-04-03 (×2): 50 mg via INTRAVENOUS

## 2023-04-03 MED ORDER — PROPOFOL 500 MG/50ML IV EMUL
INTRAVENOUS | Status: DC | PRN
  Administered 2023-04-03: 150 ug/kg/min via INTRAVENOUS

## 2023-04-03 MED ORDER — ONDANSETRON HCL 4 MG/2ML IJ SOLN
INTRAMUSCULAR | Status: AC
  Filled 2023-04-03: qty 2

## 2023-04-03 MED ORDER — LACTATED RINGERS IV SOLN: INTRAVENOUS | Status: DC | PRN

## 2023-04-03 MED ORDER — ONDANSETRON HCL 4 MG/2ML IJ SOLN
INTRAMUSCULAR | Status: DC | PRN
  Administered 2023-04-03: 4 mg via INTRAVENOUS

## 2023-04-03 NOTE — Transfer of Care (Addendum)
Immediate Anesthesia Transfer of Care Note  Patient: [facility]  Procedure(s) Performed: COLONOSCOPY WITH PROPOFOL ESOPHAGOGASTRODUODENOSCOPY (EGD) WITH PROPOFOL BIOPSY POLYPECTOMY  Patient Location: Endoscopy Unit  Anesthesia Type:General  Level of Consciousness: awake and alert   Airway & Oxygen Therapy: Patient Spontanous Breathing  Post-op Assessment: Report given to RN and Post -op Vital signs reviewed and stable  Post vital signs: Reviewed and stable  Last Vitals:  Vitals Value Taken Time  BP    Temp 36.7 C 04/03/23 0814  Pulse 95 04/03/23 0814  Resp 17 04/03/23 0814  SpO2 95 % 04/03/23 0814    Last Pain:  Vitals:   04/03/23 0814  TempSrc: Oral  PainSc:          Complications: No notable events documented.

## 2023-04-03 NOTE — Interval H&P Note (Signed)
History and Physical Interval Note:  04/03/2023 7:25 AM  Calvin Shields  has presented today for surgery, with the diagnosis of IDA.  The various methods of treatment have been discussed with the patient and family. After consideration of risks, benefits and other options for treatment, the patient has consented to  Procedure(s) with comments: COLONOSCOPY WITH PROPOFOL (N/A) - 730am, asa 3 ESOPHAGOGASTRODUODENOSCOPY (EGD) WITH PROPOFOL (N/A) as a surgical intervention.  The patient's history has been reviewed, patient examined, no change in status, stable for surgery.  I have reviewed the patient's chart and labs.  Questions were answered to the patient's satisfaction.     Eloise Harman

## 2023-04-03 NOTE — Discharge Instructions (Signed)
EGD Discharge instructions Please read the instructions outlined below and refer to this sheet in the next few weeks. These discharge instructions provide you with general information on caring for yourself after you leave the hospital. Your doctor may also give you specific instructions. While your treatment has been planned according to the most current medical practices available, unavoidable complications occasionally occur. If you have any problems or questions after discharge, please call your doctor. ACTIVITY You may resume your regular activity but move at a slower pace for the next 24 hours.  Take frequent rest periods for the next 24 hours.  Walking will help expel (get rid of) the air and reduce the bloated feeling in your abdomen.  No driving for 24 hours (because of the anesthesia (medicine) used during the test).  You may shower.  Do not sign any important legal documents or operate any machinery for 24 hours (because of the anesthesia used during the test).  NUTRITION Drink plenty of fluids.  You may resume your normal diet.  Begin with a light meal and progress to your normal diet.  Avoid alcoholic beverages for 24 hours or as instructed by your caregiver.  MEDICATIONS You may resume your normal medications unless your caregiver tells you otherwise.  WHAT YOU CAN EXPECT TODAY You may experience abdominal discomfort such as a feeling of fullness or "gas" pains.  FOLLOW-UP Your doctor will discuss the results of your test with you.  SEEK IMMEDIATE MEDICAL ATTENTION IF ANY OF THE FOLLOWING OCCUR: Excessive nausea (feeling sick to your stomach) and/or vomiting.  Severe abdominal pain and distention (swelling).  Trouble swallowing.  Temperature over 101 F (37.8 C).  Rectal bleeding or vomiting of blood.     Colonoscopy Discharge Instructions  Read the instructions outlined below and refer to this sheet in the next few weeks. These discharge instructions provide you with  general information on caring for yourself after you leave the hospital. Your doctor may also give you specific instructions. While your treatment has been planned according to the most current medical practices available, unavoidable complications occasionally occur.   ACTIVITY You may resume your regular activity, but move at a slower pace for the next 24 hours.  Take frequent rest periods for the next 24 hours.  Walking will help get rid of the air and reduce the bloated feeling in your belly (abdomen).  No driving for 24 hours (because of the medicine (anesthesia) used during the test).   Do not sign any important legal documents or operate any machinery for 24 hours (because of the anesthesia used during the test).  NUTRITION Drink plenty of fluids.  You may resume your normal diet as instructed by your doctor.  Begin with a light meal and progress to your normal diet. Heavy or fried foods are harder to digest and may make you feel sick to your stomach (nauseated).  Avoid alcoholic beverages for 24 hours or as instructed.  MEDICATIONS You may resume your normal medications unless your doctor tells you otherwise.  WHAT YOU CAN EXPECT TODAY Some feelings of bloating in the abdomen.  Passage of more gas than usual.  Spotting of blood in your stool or on the toilet paper.  IF YOU HAD POLYPS REMOVED DURING THE COLONOSCOPY: No aspirin products for 7 days or as instructed.  No alcohol for 7 days or as instructed.  Eat a soft diet for the next 24 hours.  FINDING OUT THE RESULTS OF YOUR TEST Not all test results are  available during your visit. If your test results are not back during the visit, make an appointment with your caregiver to find out the results. Do not assume everything is normal if you have not heard from your caregiver or the medical facility. It is important for you to follow up on all of your test results.  SEEK IMMEDIATE MEDICAL ATTENTION IF: You have more than a spotting of  blood in your stool.  Your belly is swollen (abdominal distention).  You are nauseated or vomiting.  You have a temperature over 101.  You have abdominal pain or discomfort that is severe or gets worse throughout the day.   Your EGD revealed mild amount inflammation in your stomach.  I took biopsies of this to rule out infection with a bacteria called H. pylori.  Await pathology results, my office will contact you.  Small hiatal hernia noted.  Esophagus and small bowel otherwise appeared normal.  Continue on pantoprazole daily.  You did have a small nodule in the back of your throat.  I will refer you to ear nose and throat doctor to have this evaluated.  Your colonoscopy revealed 4 polyp(s) which I removed successfully. Await pathology results, my office will contact you. I recommend repeating colonoscopy in 5 years for surveillance purposes.   You also have diverticulosis and internal hemorrhoids. I would recommend increasing fiber in your diet or adding OTC Benefiber/Metamucil. Be sure to drink at least 4 to 6 glasses of water daily. Follow-up with GI in 3 months    I hope you have a great rest of your week!  Elon Alas. Abbey Chatters, D.O. Gastroenterology and Hepatology [facility]

## 2023-04-03 NOTE — Telephone Encounter (Signed)
Please refer this patient to ENT, dx. oropharyngeal nodule.  Please send EGD report with referral.  Thank you

## 2023-04-03 NOTE — Anesthesia Preprocedure Evaluation (Signed)
Anesthesia Evaluation  Patient identified by MRN, date of birth, ID band Patient awake    Reviewed: Allergy & Precautions, H&P , NPO status , Patient's Chart, lab work & pertinent test results, reviewed documented beta blocker date and time   Airway Mallampati: II  TM Distance: >3 FB Neck ROM: full    Dental no notable dental hx.    Pulmonary neg pulmonary ROS, pneumonia   Pulmonary exam normal breath sounds clear to auscultation       Cardiovascular Exercise Tolerance: Good hypertension, negative cardio ROS  Rhythm:regular Rate:Normal     Neuro/Psych negative neurological ROS  negative psych ROS   GI/Hepatic negative GI ROS, Neg liver ROS,GERD  ,,  Endo/Other  diabetes  Morbid obesity  Renal/GU negative Renal ROS  negative genitourinary   Musculoskeletal   Abdominal   Peds  Hematology negative hematology ROS (+) Blood dyscrasia, anemia   Anesthesia Other Findings   Reproductive/Obstetrics negative OB ROS                             Anesthesia Physical Anesthesia Plan  ASA: 3  Anesthesia Plan: General   Post-op Pain Management:    Induction:   PONV Risk Score and Plan: Propofol infusion  Airway Management Planned:   Additional Equipment:   Intra-op Plan:   Post-operative Plan:   Informed Consent: I have reviewed the patients History and Physical, chart, labs and discussed the procedure including the risks, benefits and alternatives for the proposed anesthesia with the patient or authorized representative who has indicated his/her understanding and acceptance.     Dental Advisory Given  Plan Discussed with: CRNA  Anesthesia Plan Comments:        Anesthesia Quick Evaluation

## 2023-04-03 NOTE — Telephone Encounter (Signed)
Referral faxed to [place]

## 2023-04-03 NOTE — Op Note (Signed)
[facility] Patient Name: Calvin Shields Procedure Date: 04/03/2023 7:09 AM MRN: [id number] Date of Birth: Nov 15, 1956 Attending MD: Elon Alas. Edgar Frisk, [id number] CSN: [id number] Age: 66 Admit Type: Outpatient Procedure:                Colonoscopy Indications:              Iron deficiency anemia Providers:                Elon Alas. Abbey Chatters, DO, Janeece Riggers, RN, Ladoris Gene                            Technician, Technician Referring MD:              Medicines:                See the Anesthesia note for documentation of the                            administered medications Complications:            No immediate complications. Estimated Blood Loss:     Estimated blood loss was minimal. Procedure:                Pre-Anesthesia Assessment:                           - The anesthesia plan was to use monitored                            anesthesia care (MAC).                           After obtaining informed consent, the colonoscope                            was passed under direct vision. Throughout the                            procedure, the patient's blood pressure, pulse, and                            oxygen saturations were monitored continuously. The                            [id number] scope was introduced through                            the anus and advanced to the the terminal ileum,                            with identification of the appendiceal orifice and                            IC valve. The colonoscopy was technically difficult                            and complex due to a  redundant colon and                            significant looping. Successful completion of the                            procedure was aided by applying abdominal pressure.                            The patient tolerated the procedure well. The                            quality of the bowel preparation was evaluated                            using the BBPS [facility]  Bowel Preparation Scale)                            with scores of: Right Colon = 3, Transverse Colon =                            3 and Left Colon = 3 (entire mucosa seen well with                            no residual staining, small fragments of stool or                            opaque liquid). The total BBPS score equals 9. Scope In: 7:43:36 AM Scope Out: 8:08:20 AM Scope Withdrawal Time: 0 hours 18 minutes 34 seconds  Total Procedure Duration: 0 hours 24 minutes 44 seconds  Findings:      Non-bleeding internal hemorrhoids were found during retroflexion.      Scattered medium-mouthed diverticula were found in the entire colon.      Three sessile polyps were found in the ascending colon. The polyps were       3 to 8 mm in size. These polyps were removed with a cold snare.       Resection and retrieval were complete.      A 4 mm polyp was found in the sigmoid colon. The polyp was sessile. The       polyp was removed with a cold snare. Resection and retrieval were       complete.      The exam was otherwise without abnormality. Impression:               - Non-bleeding internal hemorrhoids.                           - Diverticulosis in the entire examined colon.                           - Three 3 to 8 mm polyps in the ascending colon,                            removed with a cold snare. Resected and retrieved.                           -  One 4 mm polyp in the sigmoid colon, removed with                            a cold snare. Resected and retrieved.                           - The examination was otherwise normal. Moderate Sedation:      Per Anesthesia Care Recommendation:           - Patient has a contact number available for                            emergencies. The signs and symptoms of potential                            delayed complications were discussed with the                            patient. Return to normal activities tomorrow.                             Written discharge instructions were provided to the                            patient.                           - Resume previous diet.                           - Continue present medications.                           - Await pathology results.                           - Repeat colonoscopy in 5 years for surveillance.                           - Return to GI clinic in 3 months. Procedure Code(s):        --- Professional ---                           [phone], Colonoscopy, flexible; with removal of                            tumor(s), polyp(s), or other lesion(s) by snare                            technique Diagnosis Code(s):        --- Professional ---                           K64.8, Other hemorrhoids                           D12.2, Benign  neoplasm of ascending colon                           D12.5, Benign neoplasm of sigmoid colon                           D50.9, Iron deficiency anemia, unspecified                           K57.30, Diverticulosis of large intestine without                            perforation or abscess without bleeding CPT copyright 2022 American [employer] Association. All rights reserved. The codes documented in this report are preliminary and upon coder review may  be revised to meet current compliance requirements. Elon Alas. Abbey Chatters, DO [place] Abbey Chatters, DO 04/03/2023 8:17:54 AM This report has been signed electronically. Number of Addenda: 0

## 2023-04-03 NOTE — Op Note (Signed)
[facility] Patient Name: Calvin Shields Procedure Date: 04/03/2023 7:10 AM MRN: [id number] Date of Birth: [birth year] Attending MD: Elon Alas. Edgar Frisk, [id number] CSN: [id number] Age: 67 Admit Type: Outpatient Procedure:                Upper GI endoscopy Indications:              Iron deficiency anemia Providers:                Elon Alas. Abbey Chatters, DO, Janeece Riggers, RN, Ladoris Gene                            Technician, Technician Referring MD:              Medicines:                See the Anesthesia note for documentation of the                            administered medications Complications:            No immediate complications. Estimated Blood Loss:     Estimated blood loss was minimal. Procedure:                Pre-Anesthesia Assessment:                           - The anesthesia plan was to use monitored                            anesthesia care (MAC).                           After obtaining informed consent, the endoscope was                            passed under direct vision. Throughout the                            procedure, the patient's blood pressure, pulse, and                            oxygen saturations were monitored continuously. The                            [id number] scope was introduced through the                            mouth, and advanced to the second part of duodenum.                            The upper GI endoscopy was accomplished without                            difficulty. The patient tolerated the procedure                            well. Scope In:  7:34:53 AM Scope Out: 7:37:44 AM Total Procedure Duration: 0 hours 2 minutes 51 seconds  Findings:      3 mm nodule found posterior to vallecula.      A 2 cm hiatal hernia was present.      Diffuse moderate inflammation characterized by erythema was found in the       entire examined stomach. Biopsies were taken with a cold forceps for       Helicobacter pylori  testing.      The duodenal bulb, first portion of the duodenum and second portion of       the duodenum were normal. Impression:               - Nodule posterior to vallecula.                           - 2 cm hiatal hernia.                           - Gastritis. Biopsied.                           - Normal duodenal bulb, first portion of the                            duodenum and second portion of the duodenum. Moderate Sedation:      Per Anesthesia Care Recommendation:           - Patient has a contact number available for                            emergencies. The signs and symptoms of potential                            delayed complications were discussed with the                            patient. Return to normal activities tomorrow.                            Written discharge instructions were provided to the                            patient.                           - Resume previous diet.                           - Continue present medications.                           - Await pathology results.                           - Return to GI clinic in 3 months.                           -  ENT referral for oropharyngeal nodule. Procedure Code(s):        --- Professional ---                           [phone], Esophagogastroduodenoscopy, flexible,                            transoral; with biopsy, single or multiple Diagnosis Code(s):        --- Professional ---                           K44.9, Diaphragmatic hernia without obstruction or                            gangrene                           K29.70, Gastritis, unspecified, without bleeding                           D50.9, Iron deficiency anemia, unspecified CPT copyright 2022 American Medical Association. All rights reserved. The codes documented in this report are preliminary and upon coder review may  be revised to meet current compliance requirements. Elon Alas. Abbey Chatters, DO [place] Abbey Chatters, DO 04/03/2023 8:13:21  AM This report has been signed electronically. Number of Addenda: 0

## 2023-04-04 LAB — SURGICAL PATHOLOGY

## 2023-04-04 NOTE — Anesthesia Postprocedure Evaluation (Signed)
Anesthesia Post Note  Patient: Calvin Shields  Procedure(s) Performed: COLONOSCOPY WITH PROPOFOL ESOPHAGOGASTRODUODENOSCOPY (EGD) WITH PROPOFOL BIOPSY POLYPECTOMY  Patient location during evaluation: Phase II Anesthesia Type: General Level of consciousness: awake Pain management: pain level controlled Vital Signs Assessment: post-procedure vital signs reviewed and stable Respiratory status: spontaneous breathing and respiratory function stable Cardiovascular status: blood pressure returned to baseline and stable Postop Assessment: no headache and no apparent nausea or vomiting Anesthetic complications: no Comments: Late entry   No notable events documented.   Last Vitals:  Vitals:   04/03/23 0815 04/03/23 0820  BP: (!) 87/45 99/60  Pulse:    Resp:    Temp:    SpO2:      Last Pain:  Vitals:   04/03/23 0814  TempSrc: Oral  PainSc:                  Windell [place]

## 2023-04-10 ENCOUNTER — Encounter (HOSPITAL_COMMUNITY): Payer: Self-pay | Admitting: Internal Medicine

## 2023-04-14 ENCOUNTER — Encounter: Payer: Self-pay | Admitting: Podiatry

## 2023-04-14 ENCOUNTER — Telehealth: Payer: Self-pay | Admitting: "Endocrinology

## 2023-04-14 ENCOUNTER — Ambulatory Visit (INDEPENDENT_AMBULATORY_CARE_PROVIDER_SITE_OTHER): Payer: Medicare HMO | Admitting: Podiatry

## 2023-04-14 DIAGNOSIS — E1142 Type 2 diabetes mellitus with diabetic polyneuropathy: Secondary | ICD-10-CM

## 2023-04-14 DIAGNOSIS — L84 Corns and callosities: Secondary | ICD-10-CM | POA: Diagnosis not present

## 2023-04-14 DIAGNOSIS — B351 Tinea unguium: Secondary | ICD-10-CM | POA: Diagnosis not present

## 2023-04-14 DIAGNOSIS — M79674 Pain in right toe(s): Secondary | ICD-10-CM

## 2023-04-14 DIAGNOSIS — M79675 Pain in left toe(s): Secondary | ICD-10-CM | POA: Diagnosis not present

## 2023-04-14 NOTE — Telephone Encounter (Signed)
Awaiting Dr. Fransico Him to complete the form. This should be tomorrow.

## 2023-04-14 NOTE — Telephone Encounter (Signed)
Pt called and left a VM stating that he is at the [facility] and they are still waiting on the form to be sent back for his Diabetic Shoes. Please Advise

## 2023-04-14 NOTE — Progress Notes (Signed)
  Subjective:  Patient ID: Calvin Shields, male    DOB: May 02, 1956,   MRN: [id number]  No chief complaint on file.   67 y.o. male presents for concern of thickened elongated and painful nails that are difficult to trim. Also has calluses Requesting to have them trimmed today. Relates burning and tingling in their feet. Patient is diabetic and last A1c was  Lab Results  Component Value Date   HGBA1C 6.0 08/26/2022   .   PCP:  Richardean Chimera, MD    . Denies any other pedal complaints. Denies n/v/f/c.   Past Medical History:  Diagnosis Date   Diabetes mellitus without complication    Hyperlipidemia    Hypertension     Objective:  Physical Exam: Vascular: DP/PT pulses 2/4 bilateral. CFT <3 seconds. Absent hair growth on digits. Edema noted to bilateral lower extremities. Xerosis noted bilaterally.  Skin. No lacerations or abrasions bilateral feet. Nails 1-5 bilateral  are thickened discolored and elongated with subungual debris.  Hyperkeratotic lesion noted to plantar right heel.  Musculoskeletal: MMT 5/5 bilateral lower extremities in DF, PF, Inversion and Eversion. Deceased ROM in DF of ankle joint.  Neurological: Sensation intact to light touch. Protective sensation diminished bilateral.    Assessment:   1. Pain due to onychomycosis of toenails of both feet   2. Pre-ulcerative corn or callous   3. Diabetic peripheral neuropathy associated with type 2 diabetes mellitus      Plan:  Patient was evaluated and treated and all questions answered. -Discussed and educated patient on diabetic foot care, especially with  regards to the vascular, neurological and musculoskeletal systems.  -Stressed the importance of good glycemic control and the detriment of not  controlling glucose levels in relation to the foot. -Discussed supportive shoes at all times and checking feet regularly.  -Mechanically debrided all nails 1-5 bilateral using sterile nail nipper and filed with dremel  without incident  -Hyperkeratotic lesion debrided without incident with chisel.  -Answered all patient questions -Patient to return  in 3 months for at risk foot care -Patient advised to call the office if any problems or questions arise in the meantime.   Louann Sjogren, DPM

## 2023-04-15 NOTE — Telephone Encounter (Signed)
Please let pt know when completed.

## 2023-04-16 NOTE — Telephone Encounter (Signed)
Given to Dr.Nida to sign.

## 2023-04-21 DIAGNOSIS — M1712 Unilateral primary osteoarthritis, left knee: Secondary | ICD-10-CM | POA: Diagnosis not present

## 2023-04-21 DIAGNOSIS — Z89412 Acquired absence of left great toe: Secondary | ICD-10-CM | POA: Diagnosis not present

## 2023-04-28 ENCOUNTER — Telehealth: Payer: Self-pay | Admitting: "Endocrinology

## 2023-04-28 DIAGNOSIS — I10 Essential (primary) hypertension: Secondary | ICD-10-CM

## 2023-04-28 DIAGNOSIS — E782 Mixed hyperlipidemia: Secondary | ICD-10-CM

## 2023-04-28 DIAGNOSIS — E1165 Type 2 diabetes mellitus with hyperglycemia: Secondary | ICD-10-CM

## 2023-04-28 NOTE — Telephone Encounter (Signed)
Labs have been updated . 

## 2023-04-28 NOTE — Telephone Encounter (Signed)
Can you update labs for pt.. thank you

## 2023-05-05 DIAGNOSIS — I10 Essential (primary) hypertension: Secondary | ICD-10-CM | POA: Diagnosis not present

## 2023-05-05 DIAGNOSIS — E1165 Type 2 diabetes mellitus with hyperglycemia: Secondary | ICD-10-CM | POA: Diagnosis not present

## 2023-05-05 DIAGNOSIS — E782 Mixed hyperlipidemia: Secondary | ICD-10-CM | POA: Diagnosis not present

## 2023-05-06 LAB — T4, FREE: Free T4: 1.09 ng/dL (ref 0.82–1.77)

## 2023-05-06 LAB — COMPREHENSIVE METABOLIC PANEL
ALT: 21 IU/L (ref 0–44)
AST: 18 IU/L (ref 0–40)
Albumin/Globulin Ratio: 1.6 (ref 1.2–2.2)
Albumin: 4.2 g/dL (ref 3.9–4.9)
Alkaline Phosphatase: 134 IU/L — ABNORMAL HIGH (ref 44–121)
BUN/Creatinine Ratio: 9 — ABNORMAL LOW (ref 10–24)
BUN: 15 mg/dL (ref 8–27)
Bilirubin Total: 0.7 mg/dL (ref 0.0–1.2)
CO2: 22 mmol/L (ref 20–29)
Calcium: 9.5 mg/dL (ref 8.6–10.2)
Chloride: 97 mmol/L (ref 96–106)
Creatinine, Ser: 1.64 mg/dL — ABNORMAL HIGH (ref 0.76–1.27)
Globulin, Total: 2.7 g/dL (ref 1.5–4.5)
Glucose: 75 mg/dL (ref 70–99)
Potassium: 4.1 mmol/L (ref 3.5–5.2)
Sodium: 136 mmol/L (ref 134–144)
Total Protein: 6.9 g/dL (ref 6.0–8.5)
eGFR: 45 mL/min/{1.73_m2} — ABNORMAL LOW (ref >59)

## 2023-05-06 LAB — LIPID PANEL
Chol/HDL Ratio: 2.5 ratio (ref 0.0–5.0)
Cholesterol, Total: 138 mg/dL (ref 100–199)
HDL: 56 mg/dL (ref >39)
LDL Chol Calc (NIH): 62 mg/dL (ref 0–99)
Triglycerides: 110 mg/dL (ref 0–149)
VLDL Cholesterol Cal: 20 mg/dL (ref 5–40)

## 2023-05-06 LAB — TSH: TSH: 1.4 u[IU]/mL (ref 0.450–4.500)

## 2023-05-12 ENCOUNTER — Encounter: Payer: Self-pay | Admitting: "Endocrinology

## 2023-05-12 ENCOUNTER — Ambulatory Visit: Payer: Medicare HMO | Admitting: "Endocrinology

## 2023-05-12 VITALS — BP 138/66 | HR 60 | Ht 69.0 in | Wt 252.6 lb

## 2023-05-12 DIAGNOSIS — Z7985 Long-term (current) use of injectable non-insulin antidiabetic drugs: Secondary | ICD-10-CM

## 2023-05-12 DIAGNOSIS — E1165 Type 2 diabetes mellitus with hyperglycemia: Secondary | ICD-10-CM | POA: Diagnosis not present

## 2023-05-12 DIAGNOSIS — Z6837 Body mass index (BMI) 37.0-37.9, adult: Secondary | ICD-10-CM | POA: Diagnosis not present

## 2023-05-12 DIAGNOSIS — I10 Essential (primary) hypertension: Secondary | ICD-10-CM

## 2023-05-12 DIAGNOSIS — E782 Mixed hyperlipidemia: Secondary | ICD-10-CM

## 2023-05-12 LAB — POCT GLYCOSYLATED HEMOGLOBIN (HGB A1C): HbA1c, POC (controlled diabetic range): 6.4 % (ref 0.0–7.0)

## 2023-05-12 MED ORDER — TRULICITY 3 MG/0.5ML ~~LOC~~ SOAJ: 3.0000 mg | SUBCUTANEOUS | 1 refills | Status: DC

## 2023-05-12 NOTE — Patient Instructions (Signed)
                                     Advice for Weight Management  -For most of us the best way to lose weight is by diet management. Generally speaking, diet management means consuming less calories intentionally which over time brings about progressive weight loss.  This can be achieved more effectively by avoiding ultra processed carbohydrates, processed meats, unhealthy fats.    It is critically important to know your numbers: how much calorie you are consuming and how much calorie you need. More importantly, our carbohydrates sources should be unprocessed naturally occurring  complex starch food items.  It is always important to balance nutrition also by  appropriate intake of proteins (mainly plant-based), healthy fats/oils, plenty of fruits and vegetables.   -The American College of Lifestyle Medicine (ACL M) recommends nutrition derived mostly from Whole Food, Plant Predominant Sources example an apple instead of applesauce or apple pie. Eat Plenty of vegetables, Mushrooms, fruits, Legumes, Whole Grains, Nuts, seeds in lieu of processed meats, processed snacks/pastries red meat, poultry, eggs.  Use only water or unsweetened tea for hydration.  The College also recommends the need to stay away from risky substances including alcohol, smoking; obtaining 7-9 hours of restorative sleep, at least 150 minutes of moderate intensity exercise weekly, importance of healthy social connections, and being mindful of stress and seek help when it is overwhelming.    -Sticking to a routine mealtime to eat 3 meals a day and avoiding unnecessary snacks is shown to have a big role in weight control. Under normal circumstances, the only time we burn stored energy is when we are hungry, so allow  some hunger to take place- hunger means no food between appropriate meal times, only water.  It is not advisable to starve.   -It is better to avoid simple carbohydrates including:  Cakes, Sweet Desserts, Ice Cream, Soda (diet and regular), Sweet Tea, Candies, Chips, Cookies, Store Bought Juices, Alcohol in Excess of  1-2 drinks a day, Lemonade,  Artificial Sweeteners, Doughnuts, Coffee Creamers, "Sugar-free" Products, etc, etc.  This is not a complete list.....    -Consulting with certified diabetes educators is proven to provide you with the most accurate and current information on diet.  Also, you may be  interested in discussing diet options/exchanges , we can schedule a visit with Calvin Shields, RDN, CDE for individualized nutrition education.  -Exercise: If you are able: 30 -60 minutes a day ,4 days a week, or 150 minutes of moderate intensity exercise weekly.    The longer the better if tolerated.  Combine stretch, strength, and aerobic activities.  If you were told in the past that you have high risk for cardiovascular diseases, or if you are currently symptomatic, you may seek evaluation by your heart doctor prior to initiating moderate to intense exercise programs.                                  Additional Care Considerations for Diabetes/Prediabetes   -Diabetes  is a chronic disease.  The most important care consideration is regular follow-up with your diabetes care provider with the goal being avoiding or delaying its complications and to take advantage of advances in medications and technology.  If appropriate actions are taken early enough, type 2 diabetes can even be   reversed.  Seek information from the right source.  - Whole Food, Plant Predominant Nutrition is highly recommended: Eat Plenty of vegetables, Mushrooms, fruits, Legumes, Whole Grains, Nuts, seeds in lieu of processed meats, processed snacks/pastries red meat, poultry, eggs as recommended by American College of  Lifestyle Medicine (ACLM).  -Type 2 diabetes is known to coexist with other important comorbidities such as high blood pressure and high cholesterol.  It is critical to control not only the  diabetes but also the high blood pressure and high cholesterol to minimize and delay the risk of complications including coronary artery disease, stroke, amputations, blindness, etc.  The good news is that this diet recommendation for type 2 diabetes is also very helpful for managing high cholesterol and high blood blood pressure.  - Studies showed that people with diabetes will benefit from a class of medications known as ACE inhibitors and statins.  Unless there are specific reasons not to be on these medications, the standard of care is to consider getting one from these groups of medications at an optimal doses.  These medications are generally considered safe and proven to help protect the heart and the kidneys.    - People with diabetes are encouraged to initiate and maintain regular follow-up with eye doctors, foot doctors, dentists , and if necessary heart and kidney doctors.     - It is highly recommended that people with diabetes quit smoking or stay away from smoking, and get yearly  flu vaccine and pneumonia vaccine at least every 5 years.  See above for additional recommendations on exercise, sleep, stress management , and healthy social connections.      

## 2023-05-12 NOTE — Progress Notes (Signed)
05/12/2023   Endocrinology follow-up note   Subjective:    Patient ID: Calvin Shields, male    DOB: [birth year], PCP Richardean Chimera, MD   Past Medical History:  Diagnosis Date   Diabetes mellitus without complication [facility])    Hyperlipidemia    Hypertension    Past Surgical History:  Procedure Laterality Date   AMPUTATION Left 09/08/2017   Procedure: PARTIAL TOE AMPUTATION LEFT HALLUX;  Surgeon: Erskine Emery, DPM;  Location: [facility];  Service: Podiatry;  Laterality: Left;   ANKLE SURGERY Bilateral    BIOPSY  04/03/2023   Procedure: BIOPSY;  Surgeon: Lanelle Bal, DO;  Location: [facility];  Service: Endoscopy;;   COLONOSCOPY WITH PROPOFOL N/A 04/03/2023   Procedure: COLONOSCOPY WITH PROPOFOL;  Surgeon: Lanelle Bal, DO;  Location: [facility];  Service: Endoscopy;  Laterality: N/A;  730am, asa 3   ESOPHAGOGASTRODUODENOSCOPY (EGD) WITH PROPOFOL N/A 04/03/2023   Procedure: ESOPHAGOGASTRODUODENOSCOPY (EGD) WITH PROPOFOL;  Surgeon: Lanelle Bal, DO;  Location: [facility];  Service: Endoscopy;  Laterality: N/A;   POLYPECTOMY  04/03/2023   Procedure: POLYPECTOMY;  Surgeon: Lanelle Bal, DO;  Location: [facility];  Service: Endoscopy;;   right leg surgery       Family History  Problem Relation Age of Onset   Diabetes Mother    Cancer - Colon Neg Hx    Colon polyps Neg Hx     Social History   Socioeconomic History   Marital status: Married    Spouse name: Not on file   Number of children: Not on file   Years of education: Not on file   Highest education level: Not on file  Occupational History   Not on file  Tobacco Use   Smoking status: Never   Smokeless tobacco: Never  Vaping Use   Vaping Use: Never used  Substance and Sexual Activity   Alcohol use: No   Drug use: No   Sexual activity: Not on file  Other Topics Concern   Not on file  Social History Narrative   Not on file   Social Determinants of Health   Financial Resource  Strain: Not on file  Food Insecurity: Not on file  Transportation Needs: Not on file  Physical Activity: Not on file  Stress: Not on file  Social Connections: Not on file   Outpatient Encounter Medications as of 05/12/2023  Medication Sig   Dulaglutide (TRULICITY) 3 MG/0.5ML SOPN Inject 3 mg as directed once a week.   allopurinol (ZYLOPRIM) 100 MG tablet Take 300 mg by mouth daily.   aspirin EC 81 MG tablet Take 81 mg by mouth daily.   atorvastatin (LIPITOR) 40 MG tablet Take 40 mg by mouth daily.   cadexomer iodine (IODOSORB) 0.9 % gel Apply to left 3rd toe once daily.   colchicine 0.6 MG tablet Take by mouth.   diclofenac Sodium (VOLTAREN) 1 % GEL Apply topically.   famotidine (PEPCID) 20 MG tablet Take 20 mg by mouth daily. prn   fluticasone (FLONASE) 50 MCG/ACT nasal spray    gabapentin (NEURONTIN) 300 MG capsule Take 300 mg by mouth 2 (two) times daily.   lisinopril-hydrochlorothiazide (PRINZIDE,ZESTORETIC) 20-25 MG per tablet Take 1 tablet by mouth daily.   Multiple Vitamin (MULTIVITAMIN WITH MINERALS) TABS tablet Take 1 tablet by mouth daily.   pantoprazole (PROTONIX) 40 MG tablet Take 1 tablet (40 mg total) by mouth daily.   tamsulosin (FLOMAX) 0.4 MG CAPS capsule Take 0.4 mg  by mouth 2 (two) times daily.   traMADol (ULTRAM) 50 MG tablet Take 50-100 mg by mouth 4 (four) times daily as needed.   [DISCONTINUED] Dulaglutide (TRULICITY) 1.5 MG/0.5ML SOPN Inject 1.5 mg into the skin once a week.   [DISCONTINUED] metFORMIN (GLUCOPHAGE-XR) 500 MG 24 hr tablet 1,000 mg daily with breakfast.   No facility-administered encounter medications on file as of 05/12/2023.   ALLERGIES: Allergies  Allergen Reactions   Codeine Nausea And Vomiting   Aspirin Other (See Comments)    Other reaction(s): Abdominal Pain, Other (See Comments)    Ibuprofen Other (See Comments)    Other reaction(s): Abdominal Pain, Other (See Comments)    VACCINATION STATUS: Immunization History  Administered  Date(s) Administered   Influenza,inj,Quad PF,6+ Mos 10/13/2017, 10/06/2019    Diabetes He presents for his follow-up diabetic visit. He has type 2 diabetes mellitus. His disease course has been worsening. There are no hypoglycemic associated symptoms. Pertinent negatives for hypoglycemia include no confusion, headaches, pallor or seizures. Pertinent negatives for diabetes include no chest pain, no fatigue, no polydipsia, no polyphagia, no polyuria and no weakness. There are no hypoglycemic complications. Symptoms are improving. Diabetic complications include peripheral neuropathy. Risk factors for coronary artery disease include diabetes mellitus, dyslipidemia, hypertension, male sex, obesity and sedentary lifestyle. He is compliant with treatment most of the time. His weight is fluctuating minimally. He is following a generally unhealthy diet. When asked about meal planning, he reported none. He has had a previous visit with a dietitian. He never participates in exercise. (He presents with controlled diabetes with A1c of 6%.  He did not document any hypoglycemia.   ) An ACE inhibitor/angiotensin II receptor blocker is being taken. Eye exam is current.  Hyperlipidemia This is a chronic problem. The current episode started more than 1 year ago. The problem is uncontrolled. Exacerbating diseases include diabetes and obesity. Pertinent negatives include no chest pain, myalgias or shortness of breath. Current antihyperlipidemic treatment includes statins. Compliance problems include adherence to diet.  Risk factors for coronary artery disease include diabetes mellitus, dyslipidemia, hypertension, male sex, obesity and a sedentary lifestyle.  Hypertension This is a chronic problem. The current episode started more than 1 year ago. Pertinent negatives include no chest pain, headaches, neck pain, palpitations or shortness of breath. Risk factors for coronary artery disease include dyslipidemia, diabetes mellitus,  obesity, male gender and sedentary lifestyle. Past treatments include ACE inhibitors.     Review of systems Limited as above.   Objective:    BP 138/66   Pulse 60   Ht 5\' 9"  (1.753 m)   Wt 252 lb 9.6 oz (114.6 kg)   BMI 37.30 kg/m   Wt Readings from Last 3 Encounters:  05/12/23 252 lb 9.6 oz (114.6 kg)  04/03/23 251 lb 5.2 oz (114 kg)  03/26/23 (P) 251 lb 5.2 oz (114 kg)        Recent Results (from the past 2160 hour(s))  CBC     Status: Abnormal   Collection Time: 03/04/23  3:30 PM  Result Value Ref Range   WBC 8.2 3.8 - 10.8 Thousand/uL   RBC 4.06 (L) 4.20 - 5.80 Million/uL   Hemoglobin 11.4 (L) 13.2 - 17.1 g/dL   HCT 16.1 (L) 09.6 - 04.5 %   MCV 84.2 80.0 - 100.0 fL   MCH 28.1 27.0 - 33.0 pg   MCHC 33.3 32.0 - 36.0 g/dL   RDW 40.9 81.1 - 91.4 %   Platelets 396 140 - 400  Thousand/uL   MPV 9.7 7.5 - 12.5 fL  Basic metabolic panel     Status: Abnormal   Collection Time: 03/26/23  3:09 PM  Result Value Ref Range   Sodium 136 135 - 145 mmol/L   Potassium 3.3 (L) 3.5 - 5.1 mmol/L   Chloride 101 98 - 111 mmol/L   CO2 25 22 - 32 mmol/L   Glucose, Bld 87 70 - 99 mg/dL    Comment: Glucose reference range applies only to samples taken after fasting for at least 8 hours.   BUN 18 8 - 23 mg/dL   Creatinine, Ser 5.28 0.61 - 1.24 mg/dL   Calcium 9.1 8.9 - 41.3 mg/dL   GFR, Estimated >24 >40 mL/min    Comment: (NOTE) Calculated using the CKD-EPI Creatinine Equation (2021)    Anion gap 10 5 - 15    Comment: Performed at [facility], [address], [place], [place] [address]  Glucose, capillary     Status: Abnormal   Collection Time: 04/03/23  6:36 AM  Result Value Ref Range   Glucose-Capillary 124 (H) 70 - 99 mg/dL    Comment: Glucose reference range applies only to samples taken after fasting for at least 8 hours.  Surgical pathology     Status: None   Collection Time: 04/03/23  7:37 AM  Result Value Ref Range   SURGICAL PATHOLOGY      SURGICAL PATHOLOGY CASE:  [id number] PATIENT: Calvin Shields Surgical Pathology Report     Clinical History: IDA (crm)     FINAL MICROSCOPIC DIAGNOSIS:  A. STOMACH, BIOPSY: - Gastric antral and oxyntic mucosa with features of reactive gastropathy - Negative for H. pylori on HE stain - Negative for intestinal metaplasia or malignancy  B. COLON, ASCENDING, POLYPECTOMY: - Tubular adenoma(s). - No high grade dysplasia or malignancy.  C. COLON, SIGMOID, POLYPECTOMY: - Hyperplastic polyp. - No dysplasia or malignancy.    GROSS DESCRIPTION:  Specimen A: Received in formalin are tan, soft tissue fragments that are submitted in toto. Number: 4.  Size: 0.2 to 0.5 cm.  Blocks: 1  Specimen B: Received in formalin are multiple irregular and polypoid tan-pink soft tissues, 1.4 x 0.9 x 0.25 cm in aggregate.  Submitted 1 block.  Specimen C: Received in formalin is a 0.4 cm tan-red polyp, bisected, 1 block.  SW 04/03/2023   Final Diagnosis performed by Eugenia Mcalpine, MD.   Electronically signed 04/04/2023 Technical component performed at [facility], [address], [place], [place] [address].  Professional component performed at [facility], [address], [place], [place] [address].  Immunohistochemistry Technical component (if applicable) was performed at [facility]. [address], [address], [place], [place] [address].   IMMUNOHISTOCHEMISTRY DISCLAIMER (if applicable): Some of these immunohistochemical stains may have been developed and the performance characteristics determine by [facility]. Some may not have been cleared or approved by the U.S. Food and Drug Administration. The FDA has determined that such clearance or approval is not necessary. This test is used for clinical purposes. It should not be regarded as investigational or for research. This laboratory is certified under the Clinical Laboratory Improvement  Amendments of 1988 (CLIA-88 ) as qualified to perform high complexity clinical laboratory testing.  The controls stained appropriately.   T4, Free     Status: None   Collection Time: 05/05/23  2:32 PM  Result Value Ref Range   Free T4 1.09 0.82 - 1.77 ng/dL  TSH  Status: None   Collection Time: 05/05/23  2:32 PM  Result Value Ref Range   TSH 1.400 0.450 - 4.500 uIU/mL  Lipid Panel     Status: None   Collection Time: 05/05/23  2:32 PM  Result Value Ref Range   Cholesterol, Total 138 100 - 199 mg/dL   Triglycerides 914 0 - 149 mg/dL   HDL 56 >78 mg/dL   VLDL Cholesterol Cal 20 5 - 40 mg/dL   LDL Chol Calc (NIH) 62 0 - 99 mg/dL   Chol/HDL Ratio 2.5 0.0 - 5.0 ratio    Comment:                                   T. Chol/HDL Ratio                                             Men  Women                               1/2 Avg.Risk  3.4    3.3                                   Avg.Risk  5.0    4.4                                2X Avg.Risk  9.6    7.1                                3X Avg.Risk 23.4   11.0   Comprehensive metabolic panel     Status: Abnormal   Collection Time: 05/05/23  2:32 PM  Result Value Ref Range   Glucose 75 70 - 99 mg/dL   BUN 15 8 - 27 mg/dL   Creatinine, Ser 2.95 (H) 0.76 - 1.27 mg/dL   eGFR 45 (L) >62 ZH/YQM/5.78   BUN/Creatinine Ratio 9 (L) 10 - 24   Sodium 136 134 - 144 mmol/L   Potassium 4.1 3.5 - 5.2 mmol/L   Chloride 97 96 - 106 mmol/L   CO2 22 20 - 29 mmol/L   Calcium 9.5 8.6 - 10.2 mg/dL   Total Protein 6.9 6.0 - 8.5 g/dL   Albumin 4.2 3.9 - 4.9 g/dL   Globulin, Total 2.7 1.5 - 4.5 g/dL   Albumin/Globulin Ratio 1.6 1.2 - 2.2   Bilirubin Total 0.7 0.0 - 1.2 mg/dL   Alkaline Phosphatase 134 (H) 44 - 121 IU/L   AST 18 0 - 40 IU/L   ALT 21 0 - 44 IU/L  HgB A1c     Status: None   Collection Time: 05/12/23  2:59 PM  Result Value Ref Range   Hemoglobin A1C     HbA1c POC (<> result, manual entry)     HbA1c, POC (prediabetic range)     HbA1c, POC  (controlled diabetic range) 6.4 0.0 - 7.0 %   Lipid Panel     Component Value Date/Time   CHOL 138 05/05/2023 1432   TRIG 110 05/05/2023 1432  HDL 56 05/05/2023 1432   CHOLHDL 2.5 05/05/2023 1432   CHOLHDL 4.1 10/06/2018 1310   VLDL 29 04/15/2016 0836   LDLCALC 62 05/05/2023 1432   LDLCALC 114 (H) 10/06/2018 1310    Assessment & Plan:   1. Diabetes mellitus without complication (HCC)  - His diabetes is  complicated by peripheral neuropathy , peripheral arterial disease, Charcot's deformities and patient remains at a high risk for more acute and chronic complications of diabetes which include CAD, CVA, CKD, retinopathy, and neuropathy. These are all discussed in detail with the patient.  He presents with controlled glycemic profile and point-of-care A1c of 6.4% .   He does not report or document hypoglycemia.  - Recent labs reviewed with him, showing normal renal function.  - I have re-counseled the patient on diet management and  and weight loss by adopting a carbohydrate restricted / protein rich  Diet.  He presents with fluctuating body weight.  - he acknowledges that there is a room for improvement in his food and drink choices. - Suggestion is made for him to avoid simple carbohydrates  from his diet including Cakes, Sweet Desserts, Ice Cream, Soda (diet and regular), Sweet Tea, Candies, Chips, Cookies, Store Bought Juices, Alcohol in Excess of  1-2 drinks a day, Artificial Sweeteners,  Coffee Creamer, and "Sugar-free" Products, Lemonade. This will help patient to have more stable blood glucose profile and potentially avoid unintended weight gain. - Patient is advised to stick to a routine mealtimes to eat 3 meals  a day and avoid unnecessary snacks ( to snack only to correct hypoglycemia).  - I have approached patient with the following individualized plan to manage diabetes and patient agrees.  -In light of his presentation with A1c of 6.4%, he would benefit from a higher  dose of Trulicity . I discussed and increased his Trulicity to 3 mg  subcutaneously weekly.  He is not on  metformin for now.  He will not need insulin nor glipizide at this time.    - Patient specific target  for A1c; LDL, HDL, Triglycerides, and  Waist Circumference were discussed in detail.  2) BP/HTN:  His blood pressure is controlled to target.   He is advised to continue his current blood pressure medications including lisinopril-hydrochlorthiazide 20-25 mg p.o. daily.    3) Lipids/HPL: His recent lipid panel revealed much improved LDL to 62 from 114.  He is advised to continue pravastatin 40 mg p.o. nightly.  Side effects and precautions discussed with him.   4)  Weight/Diet: His BMI is 37.3-a candidate for modest weight loss.  CDE consult in progress, exercise, and carbohydrates information provided.  5) Chronic Care/Health Maintenance:  -Patient is on ACEI/ARB and Statin medications and encouraged to continue to follow up with Ophthalmology, Podiatrist at least yearly or according to recommendations, and advised to  stay away from smoking. I have recommended yearly flu vaccine and pneumonia vaccination at least every 5 years; moderate intensity exercise for up to 150 minutes weekly; and  sleep for at least 7 hours a day.  History screening ABI was negative for PAD in March 2022.  His study will be repeated in March 2027, or sooner if needed.   - I advised patient to maintain close follow up with Richardean Chimera, MD for primary care needs.    I spent  26 minutes in the care of the patient today including review of labs from CMP, Lipids, Thyroid Function, Hematology (current and previous including abstractions from  other facilities); face-to-face time discussing  his blood glucose readings/logs, discussing hypoglycemia and hyperglycemia episodes and symptoms, medications doses, his options of short and long term treatment based on the latest standards of care / guidelines;   discussion about incorporating lifestyle medicine;  and documenting the encounter. Risk reduction counseling performed per USPSTF guidelines to reduce  obesity and cardiovascular risk factors.     Please refer to Patient Instructions for Blood Glucose Monitoring and Insulin/Medications Dosing Guide"  in media tab for additional information. Please  also refer to " Patient Self Inventory" in the Media  tab for reviewed elements of pertinent patient history.  [facility] participated in the discussions, expressed understanding, and voiced agreement with the above plans.  All questions were answered to his satisfaction. he is encouraged to contact clinic should he have any questions or concerns prior to his return visit.    Follow up plan: -Return in about 6 months (around 11/12/2023) for A1c -NV, Urine MA - NV, F/U with Pre-visit Labs.  Marquis Lunch, MD Phone: [phone]  Fax: [phone]  -  This note was partially dictated with voice recognition software. Similar sounding words can be transcribed inadequately or may not  be corrected upon review.  05/12/2023, 5:28 PM

## 2023-05-20 ENCOUNTER — Other Ambulatory Visit: Payer: Self-pay | Admitting: Gastroenterology

## 2023-05-20 DIAGNOSIS — D508 Other iron deficiency anemias: Secondary | ICD-10-CM

## 2023-06-04 ENCOUNTER — Ambulatory Visit (INDEPENDENT_AMBULATORY_CARE_PROVIDER_SITE_OTHER): Payer: Medicare HMO

## 2023-06-04 DIAGNOSIS — L84 Corns and callosities: Secondary | ICD-10-CM

## 2023-06-04 DIAGNOSIS — E1142 Type 2 diabetes mellitus with diabetic polyneuropathy: Secondary | ICD-10-CM

## 2023-06-04 DIAGNOSIS — M2042 Other hammer toe(s) (acquired), left foot: Secondary | ICD-10-CM

## 2023-06-04 DIAGNOSIS — M2041 Other hammer toe(s) (acquired), right foot: Secondary | ICD-10-CM

## 2023-06-04 NOTE — Progress Notes (Signed)
Patient presents today to pick up diabetic shoes and insoles.  Patient was dispensed 1 pair of diabetic shoes and 3 pairs of foam casted diabetic insoles. Fit was satisfactory. Instructions for break-in and wear was reviewed and a copy was given to the patient.   Re-appointment for regularly scheduled diabetic foot care visits or if they should experience any trouble with the shoes or insoles.  

## 2023-06-09 DIAGNOSIS — M25562 Pain in left knee: Secondary | ICD-10-CM | POA: Diagnosis not present

## 2023-06-09 DIAGNOSIS — M25561 Pain in right knee: Secondary | ICD-10-CM | POA: Diagnosis not present

## 2023-06-16 DIAGNOSIS — E1142 Type 2 diabetes mellitus with diabetic polyneuropathy: Secondary | ICD-10-CM | POA: Diagnosis not present

## 2023-06-16 DIAGNOSIS — K219 Gastro-esophageal reflux disease without esophagitis: Secondary | ICD-10-CM | POA: Diagnosis not present

## 2023-06-16 DIAGNOSIS — N1831 Chronic kidney disease, stage 3a: Secondary | ICD-10-CM | POA: Diagnosis not present

## 2023-06-16 DIAGNOSIS — E7849 Other hyperlipidemia: Secondary | ICD-10-CM | POA: Diagnosis not present

## 2023-06-16 DIAGNOSIS — E782 Mixed hyperlipidemia: Secondary | ICD-10-CM | POA: Diagnosis not present

## 2023-06-23 ENCOUNTER — Ambulatory Visit (INDEPENDENT_AMBULATORY_CARE_PROVIDER_SITE_OTHER): Payer: Medicare HMO | Admitting: Gastroenterology

## 2023-06-23 ENCOUNTER — Encounter: Payer: Self-pay | Admitting: Gastroenterology

## 2023-06-23 VITALS — BP 147/81 | HR 69 | Temp 97.4°F | Ht 69.0 in | Wt 249.4 lb

## 2023-06-23 DIAGNOSIS — D508 Other iron deficiency anemias: Secondary | ICD-10-CM

## 2023-06-23 DIAGNOSIS — N1831 Chronic kidney disease, stage 3a: Secondary | ICD-10-CM | POA: Diagnosis not present

## 2023-06-23 DIAGNOSIS — E7849 Other hyperlipidemia: Secondary | ICD-10-CM | POA: Diagnosis not present

## 2023-06-23 DIAGNOSIS — I10 Essential (primary) hypertension: Secondary | ICD-10-CM | POA: Diagnosis not present

## 2023-06-23 DIAGNOSIS — M1712 Unilateral primary osteoarthritis, left knee: Secondary | ICD-10-CM | POA: Diagnosis not present

## 2023-06-23 DIAGNOSIS — Z6838 Body mass index (BMI) 38.0-38.9, adult: Secondary | ICD-10-CM | POA: Diagnosis not present

## 2023-06-23 DIAGNOSIS — N400 Enlarged prostate without lower urinary tract symptoms: Secondary | ICD-10-CM | POA: Diagnosis not present

## 2023-06-23 NOTE — Patient Instructions (Addendum)
Come back 07/08/23 at 1pm for banding.  I provided a lab slips today as well as you can have your lab work done in 2 weeks when you come for your banding.  Remember that this appointment will be at [facility]. on the second floor.  Continue your pantoprazole daily and your iron daily.  It was a pleasure to see you today. I want to create trusting relationships with patients. If you receive a survey regarding your visit,  I greatly appreciate you taking time to fill this out on paper or through your MyChart. I value your feedback.  Brooke Bonito, MSN, FNP-BC, AGACNP-BC [facility]

## 2023-06-23 NOTE — Progress Notes (Signed)
GI Office Note    Referring Provider: Richardean Chimera, MD Primary Care Physician:  Richardean Chimera, MD Primary Gastroenterologist: Hennie Duos. Calvin Lor, DO  Date:  06/23/2023  ID:  [name], DOB Mar 03, 1956, MRN [id number]   Chief Complaint   Chief Complaint  Patient presents with   Follow-up    Pt here for follow up and to talk about a banding   History of Present Illness  Calvin Shields is a 67 y.o. male with a history of diabetes, HLD, HTN, and IDA presenting today for follow-up.  Colonoscopy in February 2020 performed at [facility]: -7 mm adenomatous appearing semipedunculated polyp in the sigmoid colon -Pathology report unavailable.   Labs in February 2024: iron 36, iron sat 11%, ferritin 24, normal B12 and folate.  Hemoglobin 11.8 in July 2023.  Last office visit 03/04/23.  Patient reported some darker stools for 3 weeks and had also noticed multiple bright red blood.  This has been occurring about every other day.  Denies any abdominal pain, nausea, vomiting, cough.  Did report some fatigue and weakness for last 3 weeks.  Has a good appetite light.  Denied any significant NSAID use.  Scheduled for upper endoscopy as well as colonoscopy.  Check CBC.  Start pantoprazole 40 mg once daily.  Consider iron supplementation after colonoscopy.  Hemoglobin 11.4 on 03/04/2023.  EGD 04/03/23: -Nodule posterior to vallecula -2 cm hiatal hernia -Gastritis s/p biopsy -Normal duodenum -Pathology with gastric antral and oxyntic mucosa with features of reactive gastropathy, negative for H. 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  Still has some occasional spots of blood with bowel movements.  When he eats something  greasy he gets constipated some. Does not have any abdominal pain.  Denies any diarrhea.  No shortness of breath or chest pain.   Still taking PPI as well as daily iron.  Does have some intermittent heartburn and takes tums as needed. Good appetite. No N/V, dysphagia, lack of appetite, early satiety.  Does still have some occasional fatigue.   Current Outpatient Medications  Medication Sig Dispense Refill   allopurinol (ZYLOPRIM) 100 MG tablet Take 300 mg by mouth daily.     aspirin EC 81 MG tablet Take 81 mg by mouth daily.     atorvastatin (LIPITOR) 40 MG tablet Take 40 mg by mouth daily.     cadexomer iodine (IODOSORB) 0.9 % gel Apply to left 3rd toe once daily. 40 g 0   colchicine 0.6 MG tablet Take by mouth.     diclofenac Sodium (VOLTAREN) 1 % GEL Apply topically.     Dulaglutide (TRULICITY) 3 MG/0.5ML SOPN Inject 3 mg as directed once a week. 6 mL 1   fluticasone (FLONASE) 50 MCG/ACT nasal spray      gabapentin (NEURONTIN) 300 MG capsule Take 300 mg by mouth 2 (two) times daily.     lisinopril-hydrochlorothiazide (PRINZIDE,ZESTORETIC) 20-25 MG per tablet Take 1 tablet by mouth daily.     Multiple Vitamin (MULTIVITAMIN WITH MINERALS) TABS tablet Take 1 tablet by mouth daily.     pantoprazole (PROTONIX) 40 MG tablet take 1 tablet once daily. 30 tablet 5   tamsulosin (FLOMAX) 0.4 MG CAPS capsule Take 0.4 mg by mouth 2 (two) times daily.     traMADol (ULTRAM) 50 MG  tablet Take 50-100 mg by mouth 4 (four) times daily as needed.     famotidine (PEPCID) 20 MG tablet Take 20 mg by mouth daily. prn (Patient not taking: Reported on 06/23/2023)     No current facility-administered medications for this visit.    Past Medical History:  Diagnosis Date   Diabetes mellitus without complication (HCC)    Hyperlipidemia    Hypertension     Past Surgical History:  Procedure Laterality Date   AMPUTATION Left 09/08/2017   Procedure: PARTIAL TOE AMPUTATION LEFT HALLUX;  Surgeon: Erskine Emery, DPM;  Location: [facility];  Service: Podiatry;  Laterality: Left;   ANKLE SURGERY Bilateral    BIOPSY  04/03/2023   Procedure: BIOPSY;  Surgeon: Lanelle Bal, DO;  Location: [facility];  Service: Endoscopy;;   COLONOSCOPY WITH PROPOFOL N/A 04/03/2023   Procedure: COLONOSCOPY WITH PROPOFOL;  Surgeon: Lanelle Bal, DO;  Location: [facility];  Service: Endoscopy;  Laterality: N/A;  730am, asa 3   ESOPHAGOGASTRODUODENOSCOPY (EGD) WITH PROPOFOL N/A 04/03/2023   Procedure: ESOPHAGOGASTRODUODENOSCOPY (EGD) WITH PROPOFOL;  Surgeon: Lanelle Bal, DO;  Location: [facility];  Service: Endoscopy;  Laterality: N/A;   POLYPECTOMY  04/03/2023   Procedure: POLYPECTOMY;  Surgeon: Lanelle Bal, DO;  Location: [facility];  Service: Endoscopy;;   right leg surgery       Family History  Problem Relation Age of Onset   Diabetes Mother    Cancer - Colon Neg Hx    Colon polyps Neg Hx     Allergies as of 06/23/2023 - Review Complete 06/23/2023  Allergen Reaction Noted   Codeine Nausea And Vomiting 06/27/2014   Aspirin Other (See Comments) 11/09/2013   Ibuprofen Other (See Comments) 11/09/2013    Social History   Socioeconomic History   Marital status: Married    Spouse name: Not on file   Number of children: Not on file   Years of education: Not on file   Highest education level: Not on file  Occupational History   Not on file  Tobacco Use   Smoking status: Never   Smokeless tobacco: Never  Vaping Use   Vaping Use: Never used  Substance and Sexual Activity   Alcohol use: No   Drug use: No   Sexual activity: Not on file  Other Topics Concern   Not on file  Social History Narrative   Not on file   Social Determinants of Health   Financial Resource Strain: Not on file  Food Insecurity: Not on file  Transportation Needs: Not on file  Physical Activity: Not on file  Stress: Not on file  Social Connections: Not on file     Review of Systems   Gen:  Denies fever, chills, anorexia. Denies fatigue, weakness, weight loss.  CV: Denies chest pain, palpitations, syncope, peripheral edema, and claudication. Resp: Denies dyspnea at rest, cough, wheezing, coughing up blood, and pleurisy. GI: See HPI Derm: Denies rash, itching, dry skin Psych: Denies depression, anxiety, memory loss, confusion. No homicidal or suicidal ideation.  Heme: Denies bruising, bleeding, and enlarged lymph nodes.   Physical Exam   BP (!) 147/81   Pulse 69   Temp (!) 97.4 F (36.3 C)   Ht 5\' 9"  (1.753 m)   Wt 249 lb 6.4 oz (113.1 kg)   BMI 36.83 kg/m   General:   Alert and oriented. No distress noted. Pleasant and cooperative.  Head:  Normocephalic and atraumatic. Eyes:  Conjuctiva clear  without scleral icterus. Mouth:  Oral mucosa pink and moist. Good dentition. No lesions. Abdomen:  +BS, soft, non-tender and non-distended. No rebound or guarding. No HSM or masses noted. Rectal: Deferred Msk:  Symmetrical without gross deformities. Normal posture. Extremities:  Without edema. Neurologic:  Alert and  oriented x4 Psych:  Alert and cooperative. Normal mood and affect.   Assessment  Calvin Shields is a 67 y.o. male with a history of diabetes, HLD, HTN, and IDA presenting today for follow-up.  Iron deficiency anemia, fatigue: Hemoglobin has been stable around the mid 11 range.  He has been taking iron once daily since his last office visit in March.  Energy level slightly improved.  Still has seen some occasional BRBPR.  Continues to deny any constipation, diarrhea, nausea, vomiting, dysphagia, lack of appetite, early satiety.  EGD and colonoscopy recently updated in April.  EGD with gastritis present with benign biopsies, negative for H. pylori and colonoscopy with 4 polyps removed and nonbleeding internal hemorrhoids.  Rectal bleeding is likely secondary to hemorrhoids.  He would like to pursue hemorrhoid banding.  Discussed this extensively in the office today.   Anemia likely in the setting of hemorrhoids as well as mild gastritis.  Will continue PPI daily for now.  Will recheck CBC and if any further decline then we may need to consider capsule study.  Given the presence of hemorrhoids patient is going to schedule beginning in the next couple weeks.  History of colon polyps: Most recent colonoscopy with 2 tubular adenomas and 1 hyperplastic polyp.  Due for repeat in 2029.  PLAN    PPI once daily Continue iron once daily Check CBC Hemorrhoid banding 7/9    Brooke Bonito, MSN, FNP-BC, [facility]

## 2023-07-07 ENCOUNTER — Ambulatory Visit (INDEPENDENT_AMBULATORY_CARE_PROVIDER_SITE_OTHER): Payer: Medicare HMO | Admitting: Podiatry

## 2023-07-07 ENCOUNTER — Encounter: Payer: Self-pay | Admitting: Podiatry

## 2023-07-07 DIAGNOSIS — L84 Corns and callosities: Secondary | ICD-10-CM

## 2023-07-07 DIAGNOSIS — B351 Tinea unguium: Secondary | ICD-10-CM | POA: Diagnosis not present

## 2023-07-07 DIAGNOSIS — M79674 Pain in right toe(s): Secondary | ICD-10-CM | POA: Diagnosis not present

## 2023-07-07 DIAGNOSIS — M79675 Pain in left toe(s): Secondary | ICD-10-CM | POA: Diagnosis not present

## 2023-07-07 DIAGNOSIS — E1142 Type 2 diabetes mellitus with diabetic polyneuropathy: Secondary | ICD-10-CM | POA: Diagnosis not present

## 2023-07-07 NOTE — Progress Notes (Signed)
  Subjective:  Patient ID: Calvin Shields, male    DOB: May 19, 1956,   MRN: [id number]  Chief Complaint  Patient presents with   Nail Problem    Routine foot care    67 y.o. male presents for concern of thickened elongated and painful nails that are difficult to trim. Also has calluses Requesting to have them trimmed today. Relates burning and tingling in their feet. Patient is diabetic and last A1c was  Lab Results  Component Value Date   HGBA1C 6.4 05/12/2023   .   PCP:  Richardean Chimera, MD    . Denies any other pedal complaints. Denies n/v/f/c.   Past Medical History:  Diagnosis Date   Diabetes mellitus without complication (HCC)    Hyperlipidemia    Hypertension     Objective:  Physical Exam: Vascular: DP/PT pulses 2/4 bilateral. CFT <3 seconds. Absent hair growth on digits. Edema noted to bilateral lower extremities. Xerosis noted bilaterally.  Skin. No lacerations or abrasions bilateral feet. Nails 1-5 bilateral  are thickened discolored and elongated with subungual debris.  Hyperkeratotic lesion noted to plantar right heel.  Musculoskeletal: MMT 5/5 bilateral lower extremities in DF, PF, Inversion and Eversion. Deceased ROM in DF of ankle joint.  Neurological: Sensation intact to light touch. Protective sensation diminished bilateral.    Assessment:   1. Pain due to onychomycosis of toenails of both feet      Plan:  Patient was evaluated and treated and all questions answered. -Discussed and educated patient on diabetic foot care, especially with  regards to the vascular, neurological and musculoskeletal systems.  -Stressed the importance of good glycemic control and the detriment of not  controlling glucose levels in relation to the foot. -Discussed supportive shoes at all times and checking feet regularly.  -Mechanically debrided all nails 1-5 bilateral using sterile nail nipper and filed with dremel without incident  -Hyperkeratotic lesion debrided without  incident with chisel.  -Answered all patient questions -Patient to return  in 3 months for at risk foot care -Patient advised to call the office if any problems or questions arise in the meantime.   Louann Sjogren, DPM

## 2023-07-07 NOTE — Progress Notes (Addendum)
   [facility] Banding Procedure Note:   Calvin Shields is a 67 y.o. male presenting today for consideration of hemorrhoid banding. Last colonoscopy 04/03/23 with non bleeding internal hemorrhoids, pancolonic diverticulosis, 3 polyps in ascending colon, 1 in sigmoid colon.  Latex Allergy: no  The patient presents with symptomatic grade 1 hemorrhoids, unresponsive to maximal medical therapy, requesting rubber band ligation of his/her hemorrhoidal disease. All risks, benefits, and alternative forms of therapy were described and informed consent was obtained.  The decision was made to band the right posterior internal hemorrhoid, and the [facility] O'Regan System was used to perform band ligation without complication. Digital anorectal examination was then performed to assure proper positioning of the band, and to adjust the banded tissue as required. The patient was discharged home without pain or other issues. Dietary and behavioral recommendations were given and (if necessary prescriptions were given), along with follow-up instructions. The patient will return in 2-3 weeks  for  additional banding as required.  No complications were encountered and the patient tolerated the procedure well.    Brooke Bonito, MSN, FNP-BC, AGACNP-BC [facility]

## 2023-07-08 ENCOUNTER — Encounter: Payer: Self-pay | Admitting: Gastroenterology

## 2023-07-08 ENCOUNTER — Ambulatory Visit (INDEPENDENT_AMBULATORY_CARE_PROVIDER_SITE_OTHER): Payer: Medicare HMO | Admitting: Gastroenterology

## 2023-07-08 VITALS — BP 129/77 | HR 86 | Temp 97.9°F | Ht 69.0 in | Wt 253.8 lb

## 2023-07-08 DIAGNOSIS — K640 First degree hemorrhoids: Secondary | ICD-10-CM | POA: Diagnosis not present

## 2023-07-08 NOTE — Patient Instructions (Signed)
Continue to avoid straining. Limit toilet time to 2-3 minutes at the most. Avoid constipation.  You can resume normal activities.   Occasionally, you may have more bleeding than usual after the banding procedure. This is often from the untreated hemorrhoids rather than the treated one. Don't be concerned if there is a tablespoon or so of blood. If there is more blood than this, lie flat with your bottom higher than your head and apply an ice pack to the area. If the bleeding does not stop within a half an hour or if you feel faint, have severe pain, chills, fever or difficulty passing urine (very rare) or other problems, you should call us at [phone] or report to the nearest emergency room. Please call me with any concerns!  The procedure you have had should have been relatively painless since the banding of the area involved does not have nerve endings and there is no pain sensation. The rubber band cuts off the blood supply to the hemorrhoid and the band may fall off as soon as 48 hours after the banding (the band may occasionally be seen in the toilet bowl following a bowel movement). You may notice a temporary feeling of fullness in the rectum which should respond adequately to plain Tylenol or Motrin.  I will see you back in 2-3 weeks  for additional banding.   Brooke Bonito, MSN, FNP-BC, AGACNP-BC [facility]

## 2023-07-22 DIAGNOSIS — M1712 Unilateral primary osteoarthritis, left knee: Secondary | ICD-10-CM | POA: Diagnosis not present

## 2023-07-28 ENCOUNTER — Encounter: Payer: Self-pay | Admitting: Gastroenterology

## 2023-07-28 ENCOUNTER — Ambulatory Visit: Payer: Medicare HMO | Admitting: Gastroenterology

## 2023-07-28 VITALS — BP 108/69 | HR 100 | Temp 98.7°F | Ht 69.0 in | Wt 252.5 lb

## 2023-07-28 DIAGNOSIS — K640 First degree hemorrhoids: Secondary | ICD-10-CM

## 2023-07-28 NOTE — Patient Instructions (Addendum)
Continue to avoid straining. Limit toilet time to 2-3 minutes at the most.   Avoid constipation. Take 2 tablespoons of natural wheat bran, natural oat bran, flax, Benefiber or any over the counter fiber supplement and increase your water intake to 7-8 glasses daily.  Occasionally, you may have more bleeding than usual after the banding procedure. This is often from the untreated hemorrhoids rather than the treated one. Don't be concerned if there is a tablespoon or so of blood. If there is more blood than this, lie flat with your bottom higher than your head and apply an ice pack to the area. If the bleeding does not stop within a half an hour or if you feel faint, have severe pain, chills, fever or difficulty passing urine (very rare) or other problems, you should call us at [phone] or report to the nearest emergency room. Please call me with any concerns!  The procedure you have had should have been relatively painless since the banding of the area involved does not have nerve endings and there is no pain sensation. The rubber band cuts off the blood supply to the hemorrhoid and the band may fall off as soon as 48 hours after the banding (the band may occasionally be seen in the toilet bowl following a bowel movement). You may notice a temporary feeling of fullness in the rectum which should respond adequately to plain Tylenol or Motrin.  I will see you back in 2-3 weeks follow-up and/or for additional banding.  Brooke Bonito, MSN, FNP-BC, AGACNP-BC [facility]

## 2023-07-28 NOTE — Progress Notes (Signed)
   [facility] Banding Procedure Note:   Calvin Shields is a 67 y.o. male presenting today for consideration of hemorrhoid banding. Last colonoscopy 04/03/23 with non bleeding internal hemorrhoids, pancolonic diverticulosis, 3 polyps in ascending colon, 1 in sigmoid colon.  Latex Allergy: no  Interval History: Did well after last visit. No significant pain. Symptoms somewhat improved.   The patient presents with symptomatic grade 1 hemorrhoids, unresponsive to maximal medical therapy, requesting rubber band ligation of his/her hemorrhoidal disease. All risks, benefits, and alternative forms of therapy were described and informed consent was obtained.  The decision was made to band the right anterior internal hemorrhoid, and the [facility] O'Regan System was used to perform band ligation without complication. Digital anorectal examination was then performed to assure proper positioning of the band, and to adjust the banded tissue as required. The patient was discharged home without pain or other issues. Dietary and behavioral recommendations were given and (if necessary prescriptions were given), along with follow-up instructions. The patient will return in 2-3 weeks for followup and possible additional banding as required.  No complications were encountered and the patient tolerated the procedure well.    Brooke Bonito, MSN, FNP-BC, AGACNP-BC [facility]

## 2023-08-18 ENCOUNTER — Ambulatory Visit (INDEPENDENT_AMBULATORY_CARE_PROVIDER_SITE_OTHER): Payer: Medicare HMO | Admitting: Gastroenterology

## 2023-08-18 ENCOUNTER — Encounter: Payer: Self-pay | Admitting: Gastroenterology

## 2023-08-18 VITALS — BP 116/76 | HR 68 | Temp 97.8°F | Ht 69.0 in | Wt 254.8 lb

## 2023-08-18 DIAGNOSIS — K640 First degree hemorrhoids: Secondary | ICD-10-CM

## 2023-08-18 NOTE — Patient Instructions (Addendum)
 Continue to avoid straining. Limit toilet time to 2-3 minutes at the most.   Avoid constipation. Take 2 tablespoons of natural wheat bran, natural oat bran, flax, Benefiber or any over the counter fiber supplement and increase your water intake to 7-8 glasses daily.  Occasionally, you may have more bleeding than usual after the banding procedure. This is often from the untreated hemorrhoids rather than the treated one. Don't be concerned if there is a tablespoon or so of blood. If there is more blood than this, lie flat with your bottom higher than your head and apply an ice pack to the area. If the bleeding does not stop within a half an hour or if you feel faint, have severe pain, chills, fever or difficulty passing urine (very rare) or other problems, you should call us at [phone] or report to the nearest emergency room. Please call me with any concerns!  The procedure you have had should have been relatively painless since the banding of the area involved does not have nerve endings and there is no pain sensation. The rubber band cuts off the blood supply to the hemorrhoid and the band may fall off as soon as 48 hours after the banding (the band may occasionally be seen in the toilet bowl following a bowel movement). You may notice a temporary feeling of fullness in the rectum which should respond adequately to plain Tylenol or Motrin.  I will see you back in 4 weeks for follow-up.   Brooke Bonito, MSN, FNP-BC, AGACNP-BC [facility]

## 2023-08-18 NOTE — Progress Notes (Signed)
   [facility] Banding Procedure Note:   Calvin Shields is a 67 y.o. male presenting today for consideration of hemorrhoid banding. Last colonoscopy 04/03/23 with non bleeding internal hemorrhoids, pancolonic diverticulosis, 3 polyps in ascending colon, 1 in sigmoid colon.  Due for repeat in 5 years.  Latex Allergy: No  Interval History: He reports a average of about 10 minutes on the commode at the time.  Not taking any fiber supplements.  Does report some intermittent diarrhea however also reports he has to strain.  Continues to feel intermittent pressure in the rectal area.  The patient presents with symptomatic grade 1 hemorrhoids, unresponsive to maximal medical therapy, requesting rubber band ligation of his/her hemorrhoidal disease. All risks, benefits, and alternative forms of therapy were described and informed consent was obtained.  The decision was made to band the right anterior internal hemorrhoid, and the [facility] O'Regan System was used to perform band ligation without complication. Digital anorectal examination was then performed to assure proper positioning of the band, and to adjust the banded tissue as required. The patient was discharged home without pain or other issues. Dietary and behavioral recommendations were given and (if necessary prescriptions were given), along with follow-up instructions. The patient will return in 4 weeks for follow up.  No complications were encountered and the patient tolerated the procedure well.    Brooke Bonito, MSN, FNP-BC, AGACNP-BC [facility]

## 2023-08-29 DIAGNOSIS — E1142 Type 2 diabetes mellitus with diabetic polyneuropathy: Secondary | ICD-10-CM | POA: Diagnosis not present

## 2023-08-29 DIAGNOSIS — E782 Mixed hyperlipidemia: Secondary | ICD-10-CM | POA: Diagnosis not present

## 2023-08-29 DIAGNOSIS — I10 Essential (primary) hypertension: Secondary | ICD-10-CM | POA: Diagnosis not present

## 2023-08-29 DIAGNOSIS — D649 Anemia, unspecified: Secondary | ICD-10-CM | POA: Diagnosis not present

## 2023-09-15 ENCOUNTER — Telehealth: Payer: Self-pay

## 2023-09-15 NOTE — Telephone Encounter (Signed)
Hi Courtney, I seen this pt this morning at [facility] and he wanted me to tell you he feels much better and he wanted to say thank you. He states he really appreciates your help

## 2023-09-16 NOTE — Progress Notes (Signed)
treated

## 2023-10-06 DIAGNOSIS — E1142 Type 2 diabetes mellitus with diabetic polyneuropathy: Secondary | ICD-10-CM | POA: Diagnosis not present

## 2023-10-06 DIAGNOSIS — Z125 Encounter for screening for malignant neoplasm of prostate: Secondary | ICD-10-CM | POA: Diagnosis not present

## 2023-10-06 DIAGNOSIS — I10 Essential (primary) hypertension: Secondary | ICD-10-CM | POA: Diagnosis not present

## 2023-10-06 DIAGNOSIS — K219 Gastro-esophageal reflux disease without esophagitis: Secondary | ICD-10-CM | POA: Diagnosis not present

## 2023-10-06 DIAGNOSIS — Z0000 Encounter for general adult medical examination without abnormal findings: Secondary | ICD-10-CM | POA: Diagnosis not present

## 2023-10-06 DIAGNOSIS — E7849 Other hyperlipidemia: Secondary | ICD-10-CM | POA: Diagnosis not present

## 2023-10-06 DIAGNOSIS — Z1329 Encounter for screening for other suspected endocrine disorder: Secondary | ICD-10-CM | POA: Diagnosis not present

## 2023-10-06 DIAGNOSIS — N1831 Chronic kidney disease, stage 3a: Secondary | ICD-10-CM | POA: Diagnosis not present

## 2023-10-06 DIAGNOSIS — Z1321 Encounter for screening for nutritional disorder: Secondary | ICD-10-CM | POA: Diagnosis not present

## 2023-10-13 ENCOUNTER — Encounter: Payer: Self-pay | Admitting: Gastroenterology

## 2023-10-13 ENCOUNTER — Telehealth: Payer: Self-pay | Admitting: Gastroenterology

## 2023-10-13 ENCOUNTER — Ambulatory Visit (INDEPENDENT_AMBULATORY_CARE_PROVIDER_SITE_OTHER): Payer: Medicare HMO | Admitting: Gastroenterology

## 2023-10-13 VITALS — BP 136/74 | HR 69 | Temp 97.6°F | Ht 69.0 in | Wt 253.0 lb

## 2023-10-13 DIAGNOSIS — Z23 Encounter for immunization: Secondary | ICD-10-CM | POA: Diagnosis not present

## 2023-10-13 DIAGNOSIS — N1831 Chronic kidney disease, stage 3a: Secondary | ICD-10-CM | POA: Diagnosis not present

## 2023-10-13 DIAGNOSIS — K640 First degree hemorrhoids: Secondary | ICD-10-CM

## 2023-10-13 DIAGNOSIS — Z6838 Body mass index (BMI) 38.0-38.9, adult: Secondary | ICD-10-CM | POA: Diagnosis not present

## 2023-10-13 DIAGNOSIS — M1712 Unilateral primary osteoarthritis, left knee: Secondary | ICD-10-CM | POA: Diagnosis not present

## 2023-10-13 DIAGNOSIS — E7849 Other hyperlipidemia: Secondary | ICD-10-CM | POA: Diagnosis not present

## 2023-10-13 DIAGNOSIS — D509 Iron deficiency anemia, unspecified: Secondary | ICD-10-CM | POA: Diagnosis not present

## 2023-10-13 DIAGNOSIS — D508 Other iron deficiency anemias: Secondary | ICD-10-CM

## 2023-10-13 DIAGNOSIS — K648 Other hemorrhoids: Secondary | ICD-10-CM | POA: Diagnosis not present

## 2023-10-13 DIAGNOSIS — Z0001 Encounter for general adult medical examination with abnormal findings: Secondary | ICD-10-CM | POA: Diagnosis not present

## 2023-10-13 DIAGNOSIS — I10 Essential (primary) hypertension: Secondary | ICD-10-CM | POA: Diagnosis not present

## 2023-10-13 DIAGNOSIS — N400 Enlarged prostate without lower urinary tract symptoms: Secondary | ICD-10-CM | POA: Diagnosis not present

## 2023-10-13 NOTE — Progress Notes (Addendum)
GI Office Note    Referring Provider: Richardean Chimera, MD Primary Care Physician:  Richardean Chimera, MD Primary Gastroenterologist: Hennie Duos. Marletta Lor, DO  Date:  10/13/2023  ID:  Calvin Shields, DOB January 14, 1956, MRN [id number]   Chief Complaint   Chief Complaint  Patient presents with   Hemorrhoids    Possible banding,states not having any issues.   History of Present Illness  Calvin Shields is a 67 y.o. male with a history of HLD, HTN, diabetes, IDA presenting today for follow-up post hemorrhoid banding.  Colonoscopy in February 2020 performed at [facility]: -7 mm adenomatous appearing semipedunculated polyp in the sigmoid colon -Pathology report unavailable.   Labs in February 2024: iron 36, iron sat 11%, ferritin 24, normal B12 and folate. Hemoglobin 11.8 in July 2023.   Hemoglobin 11.4 on 03/04/2023.   EGD 04/03/23: -Nodule posterior to vallecula -2 cm hiatal hernia -Gastritis s/p biopsy -Normal duodenum -Pathology with gastric antral and oxyntic mucosa with features of reactive gastropathy, negative for H. Pylori) -Advised ENT referral for oropharyngeal nodule   Colonoscopy 04/03/23: -Nonbleeding internal -Pancolonic diverticulosis -3 polyps removed from ascending colon (3-8 mm) (tubular adenoma) -4 mm polyp in sigmoid colon (hyperplastic) -Repeat colonoscopy in 5 years  Today: Has been drinking a Ryze tea at night and has been doing it for 2 weeks. Able to sleep throughout the night and having improved bowel function. BP has not been up and blood sugars are stable. Helping with appetite suppression but not having any side effects. No bloating. Has a lot of natural properties. No longer having pressure or rectal pain.  Any melena, BRBPR, diarrhea, early satiety, unintentional weight loss or weight gain.  Denies pica.  Has good energy.    Current Outpatient Medications  Medication Sig Dispense Refill   allopurinol (ZYLOPRIM) 100 MG tablet Take 300 mg by mouth daily.      aspirin EC 81 MG tablet Take 81 mg by mouth. As needed     atorvastatin (LIPITOR) 40 MG tablet Take 40 mg by mouth daily.     colchicine 0.6 MG tablet Take by mouth.     diclofenac Sodium (VOLTAREN) 1 % GEL Apply topically.     Dulaglutide (TRULICITY) 3 MG/0.5ML SOPN Inject 3 mg as directed once a week. 6 mL 1   famotidine (PEPCID) 20 MG tablet Take 20 mg by mouth daily. prn     fluticasone (FLONASE) 50 MCG/ACT nasal spray      gabapentin (NEURONTIN) 300 MG capsule Take 300 mg by mouth 2 (two) times daily.     lisinopril-hydrochlorothiazide (PRINZIDE,ZESTORETIC) 20-25 MG per tablet Take 1 tablet by mouth daily.     Multiple Vitamin (MULTIVITAMIN WITH MINERALS) TABS tablet Take 1 tablet by mouth daily.     pantoprazole (PROTONIX) 40 MG tablet take 1 tablet once daily. 30 tablet 5   tamsulosin (FLOMAX) 0.4 MG CAPS capsule Take 0.4 mg by mouth 2 (two) times daily.     traMADol (ULTRAM) 50 MG tablet Take 50-100 mg by mouth 4 (four) times daily as needed.     No current facility-administered medications for this visit.    Past Medical History:  Diagnosis Date   Diabetes mellitus without complication (HCC)    Hyperlipidemia    Hypertension     Past Surgical History:  Procedure Laterality Date   AMPUTATION Left 09/08/2017   Procedure: PARTIAL TOE AMPUTATION LEFT HALLUX;  Surgeon: Erskine Emery, DPM;  Location: [facility];  Service: Podiatry;  Laterality: Left;  ANKLE SURGERY Bilateral    BIOPSY  04/03/2023   Procedure: BIOPSY;  Surgeon: Lanelle Bal, DO;  Location: [facility];  Service: Endoscopy;;   COLONOSCOPY WITH PROPOFOL N/A 04/03/2023   Procedure: COLONOSCOPY WITH PROPOFOL;  Surgeon: Lanelle Bal, DO;  Location: [facility];  Service: Endoscopy;  Laterality: N/A;  730am, asa 3   ESOPHAGOGASTRODUODENOSCOPY (EGD) WITH PROPOFOL N/A 04/03/2023   Procedure: ESOPHAGOGASTRODUODENOSCOPY (EGD) WITH PROPOFOL;  Surgeon: Lanelle Bal, DO;  Location: [facility];  Service:  Endoscopy;  Laterality: N/A;   POLYPECTOMY  04/03/2023   Procedure: POLYPECTOMY;  Surgeon: Lanelle Bal, DO;  Location: [facility];  Service: Endoscopy;;   right leg surgery       Family History  Problem Relation Age of Onset   Diabetes Mother    Cancer - Colon Neg Hx    Colon polyps Neg Hx     Allergies as of 10/13/2023 - Review Complete 10/13/2023  Allergen Reaction Noted   Codeine Nausea And Vomiting 06/27/2014   Aspirin Other (See Comments) 11/09/2013   Ibuprofen Other (See Comments) 11/09/2013    Social History   Socioeconomic History   Marital status: Married    Spouse name: Not on file   Number of children: Not on file   Years of education: Not on file   Highest education level: Not on file  Occupational History   Not on file  Tobacco Use   Smoking status: Never    Passive exposure: Never   Smokeless tobacco: Never  Vaping Use   Vaping status: Never Used  Substance and Sexual Activity   Alcohol use: No   Drug use: No   Sexual activity: Not on file  Other Topics Concern   Not on file  Social History Narrative   Not on file   Social Determinants of Health   Financial Resource Strain: Not on file  Food Insecurity: Not on file  Transportation Needs: Not on file  Physical Activity: Not on file  Stress: Not on file  Social Connections: Unknown (07/23/2022)   Received from [facility]   Social Network    Social Network: Not on file     Review of Systems   Gen: Denies fever, chills, anorexia. Denies fatigue, weakness, weight loss.  CV: Denies chest pain, palpitations, syncope, peripheral edema, and claudication. Resp: Denies dyspnea at rest, cough, wheezing, coughing up blood, and pleurisy. GI: See HPI Derm: Denies rash, itching, dry skin Psych: Denies depression, anxiety, memory loss, confusion. No homicidal or suicidal ideation.  Heme: Denies bruising, bleeding, and enlarged lymph nodes.   Physical Exam   BP 136/74 (BP  Location: Right Arm, Patient Position: Sitting, Cuff Size: Shields)   Pulse 69   Temp 97.6 F (36.4 C) (Oral)   Ht 5\' 9"  (1.753 m)   Wt 253 lb (114.8 kg)   SpO2 96%   BMI 37.36 kg/m   General:   Alert and oriented. No distress noted. Pleasant and cooperative.  Head:  Normocephalic and atraumatic. Eyes:  Conjuctiva clear without scleral icterus. Mouth:  Oral mucosa pink and moist. Good dentition. No lesions. Abdomen:  +BS, soft, non-tender and non-distended. No rebound or guarding. No HSM or masses noted. Rectal: deferred Msk:  Symmetrical without gross deformities. Normal posture. Extremities:  Without edema. Neurologic:  Alert and  oriented x4 Psych:  Alert and cooperative. Normal mood and affect.   Assessment  Calvin Shields is a 67 y.o. male with a history  of HLD, HTN, diabetes, IDA presenting today for follow-up post hemorrhoid banding.  IDA: Hemoglobin stable around the mid 11 range in March 2024.  Patient reports he had lab work recently including hemoglobin and iron studies with primary care and is due to follow-up with them this afternoon.  Will request these records to see if he needs to continue oral iron.  EGD and colonoscopy updated in April 2024.  EGD with benign gastritis and colonoscopy with removal of 4 polyps and evidence of nonbleeding internal hemorrhoids.  Chronic intermittent rectal bleeding thought to be secondary to hemorrhoids.  Advise continue PPI once daily.  Hemorrhoids: Recent colonoscopy in April 2024 with nonbleeding internal hemorrhoids.  Has been experiencing intermittent rectal bleeding for some time.  He has underwent banding x 3 in the had good results.  Currently straining and has had no rectal pain, itching, or bleeding.  Advised for him to continue to avoid straining to prevent recurrence and continue natural remedies for control of intermittent constipation.  Other hemorrhoid precautions discussed as well.  PLAN   Send results from PCP for recent  CBC, iron panel Continue pantoprazole 40 mg once daily Continue oral iron for now Continue natural remedies for constipation.  Hemorrhoid precautions reinforced. Follow up as needed  Addendum: Labs from PCP reviewed.  Hemoglobin normal at 14.  Iron studies not performed.  For now continue oral iron.   Brooke Bonito, MSN, FNP-BC, AGACNP-BC [facility]

## 2023-10-13 NOTE — Telephone Encounter (Signed)
Labs from his PCP are in epic now.

## 2023-10-13 NOTE — Patient Instructions (Signed)
Continue using your natural remedies to help with constipation.  Continue to avoid straining and spending long periods of time on the commode.  For now continue taking your oral iron.  Please have your primary care send [place] the results of your recent blood work that include your hemoglobin and your iron panel.  Follow-up as needed.  It was a pleasure to see you today. I want to create trusting relationships with patients. If you receive a survey regarding your visit,  I greatly appreciate you taking time to fill this out on paper or through your MyChart. I value your feedback.  Brooke Bonito, MSN, FNP-BC, AGACNP-BC [facility]

## 2023-10-15 ENCOUNTER — Encounter: Payer: Self-pay | Admitting: Podiatry

## 2023-10-15 ENCOUNTER — Ambulatory Visit: Payer: Medicare HMO | Admitting: Podiatry

## 2023-10-15 DIAGNOSIS — M2042 Other hammer toe(s) (acquired), left foot: Secondary | ICD-10-CM | POA: Diagnosis not present

## 2023-10-15 DIAGNOSIS — M79674 Pain in right toe(s): Secondary | ICD-10-CM | POA: Diagnosis not present

## 2023-10-15 DIAGNOSIS — M79675 Pain in left toe(s): Secondary | ICD-10-CM

## 2023-10-15 DIAGNOSIS — B351 Tinea unguium: Secondary | ICD-10-CM

## 2023-10-15 DIAGNOSIS — E1142 Type 2 diabetes mellitus with diabetic polyneuropathy: Secondary | ICD-10-CM

## 2023-10-15 DIAGNOSIS — L84 Corns and callosities: Secondary | ICD-10-CM | POA: Diagnosis not present

## 2023-10-15 NOTE — Progress Notes (Signed)
Subjective:  Patient ID: Calvin Shields, male    DOB: 1956-01-01,   MRN: [id number]  No chief complaint on file.   67 y.o. male presents for concern of thickened elongated and painful nails that are difficult to trim. Also has calluses and concerned the one has been worsening and been more painful. Requesting to have them trimmed today. Relates burning and tingling in their feet. Patient is diabetic and last A1c was  Lab Results  Component Value Date   HGBA1C 6.4 05/12/2023   .   PCP:  Richardean Chimera, MD    . Denies any other pedal complaints. Denies n/v/f/c.   Past Medical History:  Diagnosis Date   Diabetes mellitus without complication (HCC)    Hyperlipidemia    Hypertension     Objective:  Physical Exam: Vascular: DP/PT pulses 2/4 bilateral. CFT <3 seconds. Absent hair growth on digits. Edema noted to bilateral lower extremities. Xerosis noted bilaterally.  Skin. No lacerations or abrasions bilateral feet. Nails 1-5 bilateral  are thickened discolored and elongated with subungual debris.  Hyperkeratotic lesion noted to plantar right heel. Distal left third digit hyperkeratosis nearly ulcerative. No open lesions. No erythema edema or purulence noted.  Musculoskeletal: MMT 5/5 bilateral lower extremities in DF, PF, Inversion and Eversion. Deceased ROM in DF of ankle joint.  Neurological: Sensation intact to light touch. Protective sensation diminished bilateral.    Assessment:   1. Pain due to onychomycosis of toenails of both feet   2. Pre-ulcerative corn or callous   3. Diabetic peripheral neuropathy associated with type 2 diabetes mellitus (HCC)   4. Hammertoe of left foot      Plan:  Patient was evaluated and treated and all questions answered. -Discussed and educated patient on diabetic foot care, especially with  regards to the vascular, neurological and musculoskeletal systems.  -Stressed the importance of good glycemic control and the detriment of not   controlling glucose levels in relation to the foot. -Discussed supportive shoes at all times and checking feet regularly.  -Mechanically debrided all nails 1-5 bilateral using sterile nail nipper and filed with dremel without incident  -Hyperkeratotic lesion debrided without incident with chisel.  -Discussed trying flexor tenotomy to the toe to prevent worsening ulceration of the digit. Procedure below.   Procedure: Flexor Tenotomy Indication for Procedure: toe with semi-reducible hammertoe with distal tip ulceration. Flexor tenotomy indicated to alleviate contracture, reduce pressure, and enhance healing of the ulceration. Location: left third digit  Anesthesia: Lidocaine 2% plain; 3mL digital block Instrumentation: 18 gauge needle  Technique: The toe was anesthetized as above and prepped in the usual fashion. The toe was exanquinated and a tourniquet was secured at the base of the toe. A 18 gauge needle was then used to make a transverse incision over the plantar aspect of the distal interphalangeal joint. The flexor tendon was incised with noted release of the hammertoe deformity. The incision was then irrigated and dressed with sterile dressing. Patient tolerated the procedure well. Dressing: Dry, sterile, compression dressing. Disposition: Patient tolerated procedure well. Patient to return in 1 week for follow-up.    Louann Sjogren, DPM

## 2023-10-17 ENCOUNTER — Encounter: Payer: Self-pay | Admitting: Podiatry

## 2023-10-17 MED ORDER — KETOCONAZOLE 2 % EX CREA: 1.0000 | TOPICAL_CREAM | Freq: Every day | CUTANEOUS | 2 refills | Status: DC

## 2023-10-20 DIAGNOSIS — G8929 Other chronic pain: Secondary | ICD-10-CM | POA: Diagnosis not present

## 2023-10-20 DIAGNOSIS — M25362 Other instability, left knee: Secondary | ICD-10-CM | POA: Diagnosis not present

## 2023-10-20 DIAGNOSIS — M1712 Unilateral primary osteoarthritis, left knee: Secondary | ICD-10-CM | POA: Diagnosis not present

## 2023-10-20 DIAGNOSIS — M21162 Varus deformity, not elsewhere classified, left knee: Secondary | ICD-10-CM | POA: Diagnosis not present

## 2023-10-22 ENCOUNTER — Encounter: Payer: Self-pay | Admitting: Podiatry

## 2023-10-22 ENCOUNTER — Ambulatory Visit (INDEPENDENT_AMBULATORY_CARE_PROVIDER_SITE_OTHER): Payer: Medicare HMO | Admitting: Podiatry

## 2023-10-22 DIAGNOSIS — M2042 Other hammer toe(s) (acquired), left foot: Secondary | ICD-10-CM

## 2023-10-22 DIAGNOSIS — L84 Corns and callosities: Secondary | ICD-10-CM

## 2023-10-22 DIAGNOSIS — E1142 Type 2 diabetes mellitus with diabetic polyneuropathy: Secondary | ICD-10-CM

## 2023-10-22 NOTE — Progress Notes (Signed)
Subjective:  Patient ID: [name], male    DOB: 02-06-1956,   MRN: [id number]  No chief complaint on file.   67 y.o. male presents for follow-up of left third digit tenotomy. Relates doing well and no issues with the toe. Is requesting to have the left second digit done now as the first one did so well.  Patient is diabetic and last A1c was  Lab Results  Component Value Date   HGBA1C 6.4 05/12/2023   .   PCP:  Richardean Chimera, MD    . Denies any other pedal complaints. Denies n/v/f/c.   Past Medical History:  Diagnosis Date   Diabetes mellitus without complication (HCC)    Hyperlipidemia    Hypertension     Objective:  Physical Exam: Vascular: DP/PT pulses 2/4 bilateral. CFT <3 seconds. Absent hair growth on digits. Edema noted to bilateral lower extremities. Xerosis noted bilaterally.  Skin. No lacerations or abrasions bilateral feet. Nails 1-5 bilateral  are thickened discolored and elongated with subungual debris.  Hyperkeratotic lesion noted to plantar right heel. Distal left third digit hyperkeratosis nearly ulcerative but improved.  Musculoskeletal: MMT 5/5 bilateral lower extremities in DF, PF, Inversion and Eversion. Deceased ROM in DF of ankle joint. Left second digit hammertoe semi rigid but halfway reducible.  Neurological: Sensation intact to light touch. Protective sensation diminished bilateral.    Assessment:   1. Hammertoe of left foot   2. Pre-ulcerative corn or callous   3. Diabetic peripheral neuropathy associated with type 2 diabetes mellitus (HCC)       Plan:  Patient was evaluated and treated and all questions answered. -Left third digit well healed and improving pre ulcerative area.   -Discussed trying flexor tenotomy to the toe to prevent worsening ulceration of the digit. Patient now requesting procedure done on his second toe since the third toe did so well.  Procedure below.   Procedure: Flexor Tenotomy left second toe.  Indication for  Procedure: toe with semi-reducible hammertoe with distal tip ulceration. Flexor tenotomy indicated to alleviate contracture, reduce pressure, and enhance healing of the ulceration. Location: left third digit  Anesthesia: Lidocaine 2% plain; 3mL digital block Instrumentation: 18 gauge needle  Technique: The toe was anesthetized as above and prepped in the usual fashion. The toe was exanquinated and a tourniquet was secured at the base of the toe. A 18 gauge needle was then used to make a transverse incision over the plantar aspect of the distal interphalangeal joint. The flexor tendon was incised with noted release of the hammertoe deformity. The incision was then irrigated and dressed with sterile dressing. Patient tolerated the procedure well. Dressing: Dry, sterile, compression dressing. Disposition: Patient tolerated procedure well. Patient to return in 1 week for follow-up.    Louann Sjogren, DPM

## 2023-11-03 ENCOUNTER — Ambulatory Visit (INDEPENDENT_AMBULATORY_CARE_PROVIDER_SITE_OTHER): Payer: Medicare HMO | Admitting: Podiatry

## 2023-11-03 ENCOUNTER — Encounter: Payer: Self-pay | Admitting: Podiatry

## 2023-11-03 DIAGNOSIS — E1142 Type 2 diabetes mellitus with diabetic polyneuropathy: Secondary | ICD-10-CM

## 2023-11-03 DIAGNOSIS — M2042 Other hammer toe(s) (acquired), left foot: Secondary | ICD-10-CM

## 2023-11-03 NOTE — Progress Notes (Signed)
  Subjective:  Patient ID: Calvin Shields, male    DOB: 11-Apr-1956,   MRN: [id number]  No chief complaint on file.   67 y.o. male presents for follow-up of left  second  digit tenotomy. Relates doing ok but concerned for the second toe has been trying to keep clean with alcohol and soaks.   Patient is diabetic and last A1c was  Lab Results  Component Value Date   HGBA1C 6.4 05/12/2023   .   PCP:  Richardean Chimera, MD    . Denies any other pedal complaints. Denies n/v/f/c.   Past Medical History:  Diagnosis Date   Diabetes mellitus without complication (HCC)    Hyperlipidemia    Hypertension     Objective:  Physical Exam: Vascular: DP/PT pulses 2/4 bilateral. CFT <3 seconds. Absent hair growth on digits. Edema noted to bilateral lower extremities. Xerosis noted bilaterally.  Skin. No lacerations or abrasions bilateral feet. Nails 1-5 bilateral  are thickened discolored and elongated with subungual debris.  Hyperkeratotic lesion noted to plantar right heel. Distal left third digit hyperkeratosis nearly ulcerative but improved. Plantar second digit with superficial ulceration about 1 cm in diabeter with sloughed skin.  Musculoskeletal: MMT 5/5 bilateral lower extremities in DF, PF, Inversion and Eversion. Deceased ROM in DF of ankle joint. Improved position of left second and third digit hammering.  Neurological: Sensation intact to light touch. Protective sensation diminished bilateral.    Assessment:   1. Hammertoe of left foot   2. Diabetic peripheral neuropathy associated with type 2 diabetes mellitus (HCC)        Plan:  Patient was evaluated and treated and all questions answered. -Left third digit well healed and improving pre ulcerative area.  -Left second digit with small superficial ulceration in area of incision.  -Advised to keep betadine on the area and keep wrapped. Change daily.  -Dressed with betadine, DSD. -No abx indicated.  -Discussed glucose control and  proper protein-rich diet.  -Discussed if any worsening redness, pain, fever or chills to call or may need to report to the emergency room. Patient expressed understanding.    Return in about 2 weeks (around 11/17/2023) for wound check.  Return as scheduled for [facility] in 2 months.     Louann Sjogren, DPM

## 2023-11-17 ENCOUNTER — Other Ambulatory Visit: Payer: Self-pay | Admitting: *Deleted

## 2023-11-17 ENCOUNTER — Ambulatory Visit (INDEPENDENT_AMBULATORY_CARE_PROVIDER_SITE_OTHER): Payer: Medicare HMO | Admitting: Podiatry

## 2023-11-17 ENCOUNTER — Telehealth: Payer: Self-pay | Admitting: "Endocrinology

## 2023-11-17 DIAGNOSIS — I10 Essential (primary) hypertension: Secondary | ICD-10-CM

## 2023-11-17 DIAGNOSIS — E782 Mixed hyperlipidemia: Secondary | ICD-10-CM | POA: Diagnosis not present

## 2023-11-17 DIAGNOSIS — Z91199 Patient's noncompliance with other medical treatment and regimen due to unspecified reason: Secondary | ICD-10-CM

## 2023-11-17 DIAGNOSIS — E1165 Type 2 diabetes mellitus with hyperglycemia: Secondary | ICD-10-CM

## 2023-11-17 DIAGNOSIS — Z6839 Body mass index (BMI) 39.0-39.9, adult: Secondary | ICD-10-CM | POA: Diagnosis not present

## 2023-11-17 DIAGNOSIS — E66812 Obesity, class 2: Secondary | ICD-10-CM | POA: Diagnosis not present

## 2023-11-17 NOTE — Telephone Encounter (Signed)
Can you update these labs, he's going today at 1

## 2023-11-17 NOTE — Telephone Encounter (Signed)
Labs updated

## 2023-11-17 NOTE — Progress Notes (Signed)
No show

## 2023-11-18 LAB — COMPREHENSIVE METABOLIC PANEL
ALT: 37 [IU]/L (ref 0–44)
AST: 26 [IU]/L (ref 0–40)
Albumin: 4.5 g/dL (ref 3.9–4.9)
Alkaline Phosphatase: 134 [IU]/L — ABNORMAL HIGH (ref 44–121)
BUN/Creatinine Ratio: 11 (ref 10–24)
BUN: 14 mg/dL (ref 8–27)
Bilirubin Total: 0.7 mg/dL (ref 0.0–1.2)
CO2: 24 mmol/L (ref 20–29)
Calcium: 9.8 mg/dL (ref 8.6–10.2)
Chloride: 97 mmol/L (ref 96–106)
Creatinine, Ser: 1.32 mg/dL — ABNORMAL HIGH (ref 0.76–1.27)
Globulin, Total: 3 g/dL (ref 1.5–4.5)
Glucose: 107 mg/dL — ABNORMAL HIGH (ref 70–99)
Potassium: 3.8 mmol/L (ref 3.5–5.2)
Sodium: 138 mmol/L (ref 134–144)
Total Protein: 7.5 g/dL (ref 6.0–8.5)
eGFR: 59 mL/min/{1.73_m2} — ABNORMAL LOW (ref >59)

## 2023-11-18 LAB — LIPID PANEL
Chol/HDL Ratio: 3.5 ratio (ref 0.0–5.0)
Cholesterol, Total: 152 mg/dL (ref 100–199)
HDL: 43 mg/dL (ref >39)
LDL Chol Calc (NIH): 79 mg/dL (ref 0–99)
Triglycerides: 178 mg/dL — ABNORMAL HIGH (ref 0–149)
VLDL Cholesterol Cal: 30 mg/dL (ref 5–40)

## 2023-11-18 LAB — T4, FREE: Free T4: 0.97 ng/dL (ref 0.82–1.77)

## 2023-11-18 LAB — TSH: TSH: 3.11 u[IU]/mL (ref 0.450–4.500)

## 2023-11-19 DIAGNOSIS — Z6836 Body mass index (BMI) 36.0-36.9, adult: Secondary | ICD-10-CM | POA: Diagnosis not present

## 2023-11-19 DIAGNOSIS — S39012A Strain of muscle, fascia and tendon of lower back, initial encounter: Secondary | ICD-10-CM | POA: Diagnosis not present

## 2023-11-21 DIAGNOSIS — Z6836 Body mass index (BMI) 36.0-36.9, adult: Secondary | ICD-10-CM | POA: Diagnosis not present

## 2023-11-21 DIAGNOSIS — S39012D Strain of muscle, fascia and tendon of lower back, subsequent encounter: Secondary | ICD-10-CM | POA: Diagnosis not present

## 2023-11-21 DIAGNOSIS — R030 Elevated blood-pressure reading, without diagnosis of hypertension: Secondary | ICD-10-CM | POA: Diagnosis not present

## 2023-11-21 DIAGNOSIS — M25552 Pain in left hip: Secondary | ICD-10-CM | POA: Diagnosis not present

## 2023-11-23 DIAGNOSIS — E785 Hyperlipidemia, unspecified: Secondary | ICD-10-CM | POA: Diagnosis not present

## 2023-11-23 DIAGNOSIS — Z885 Allergy status to narcotic agent status: Secondary | ICD-10-CM | POA: Diagnosis not present

## 2023-11-23 DIAGNOSIS — I10 Essential (primary) hypertension: Secondary | ICD-10-CM | POA: Diagnosis not present

## 2023-11-23 DIAGNOSIS — M1612 Unilateral primary osteoarthritis, left hip: Secondary | ICD-10-CM | POA: Diagnosis not present

## 2023-11-23 DIAGNOSIS — M160 Bilateral primary osteoarthritis of hip: Secondary | ICD-10-CM | POA: Diagnosis not present

## 2023-11-23 DIAGNOSIS — N400 Enlarged prostate without lower urinary tract symptoms: Secondary | ICD-10-CM | POA: Diagnosis not present

## 2023-11-23 DIAGNOSIS — Z886 Allergy status to analgesic agent status: Secondary | ICD-10-CM | POA: Diagnosis not present

## 2023-11-23 DIAGNOSIS — E119 Type 2 diabetes mellitus without complications: Secondary | ICD-10-CM | POA: Diagnosis not present

## 2023-11-23 DIAGNOSIS — M5432 Sciatica, left side: Secondary | ICD-10-CM | POA: Diagnosis not present

## 2023-11-23 DIAGNOSIS — M79605 Pain in left leg: Secondary | ICD-10-CM | POA: Diagnosis not present

## 2023-11-23 DIAGNOSIS — M545 Low back pain, unspecified: Secondary | ICD-10-CM | POA: Diagnosis not present

## 2023-11-23 DIAGNOSIS — M5442 Lumbago with sciatica, left side: Secondary | ICD-10-CM | POA: Diagnosis not present

## 2023-11-24 ENCOUNTER — Encounter: Payer: Self-pay | Admitting: "Endocrinology

## 2023-11-24 ENCOUNTER — Ambulatory Visit (INDEPENDENT_AMBULATORY_CARE_PROVIDER_SITE_OTHER): Payer: Medicare HMO | Admitting: "Endocrinology

## 2023-11-24 ENCOUNTER — Other Ambulatory Visit: Payer: Self-pay | Admitting: Gastroenterology

## 2023-11-24 VITALS — BP 94/56 | HR 96 | Ht 69.0 in | Wt 245.2 lb

## 2023-11-24 DIAGNOSIS — I10 Essential (primary) hypertension: Secondary | ICD-10-CM

## 2023-11-24 DIAGNOSIS — Z6839 Body mass index (BMI) 39.0-39.9, adult: Secondary | ICD-10-CM

## 2023-11-24 DIAGNOSIS — E66812 Obesity, class 2: Secondary | ICD-10-CM | POA: Diagnosis not present

## 2023-11-24 DIAGNOSIS — Z7985 Long-term (current) use of injectable non-insulin antidiabetic drugs: Secondary | ICD-10-CM | POA: Diagnosis not present

## 2023-11-24 DIAGNOSIS — E782 Mixed hyperlipidemia: Secondary | ICD-10-CM

## 2023-11-24 DIAGNOSIS — E1165 Type 2 diabetes mellitus with hyperglycemia: Secondary | ICD-10-CM | POA: Diagnosis not present

## 2023-11-24 DIAGNOSIS — D508 Other iron deficiency anemias: Secondary | ICD-10-CM

## 2023-11-24 LAB — POCT GLYCOSYLATED HEMOGLOBIN (HGB A1C): HbA1c, POC (controlled diabetic range): 6.9 % (ref 0.0–7.0)

## 2023-11-24 NOTE — Progress Notes (Signed)
11/24/2023   Endocrinology follow-up note   Subjective:    Patient ID: Calvin Shields, male    DOB: 1956-04-09, PCP Richardean Chimera, MD   Past Medical History:  Diagnosis Date   Diabetes mellitus without complication [facility])    Hyperlipidemia    Hypertension    Past Surgical History:  Procedure Laterality Date   AMPUTATION Left 09/08/2017   Procedure: PARTIAL TOE AMPUTATION LEFT HALLUX;  Surgeon: Erskine Emery, DPM;  Location: [facility];  Service: Podiatry;  Laterality: Left;   ANKLE SURGERY Bilateral    BIOPSY  04/03/2023   Procedure: BIOPSY;  Surgeon: Lanelle Bal, DO;  Location: [facility];  Service: Endoscopy;;   COLONOSCOPY WITH PROPOFOL N/A 04/03/2023   Procedure: COLONOSCOPY WITH PROPOFOL;  Surgeon: Lanelle Bal, DO;  Location: [facility];  Service: Endoscopy;  Laterality: N/A;  730am, asa 3   ESOPHAGOGASTRODUODENOSCOPY (EGD) WITH PROPOFOL N/A 04/03/2023   Procedure: ESOPHAGOGASTRODUODENOSCOPY (EGD) WITH PROPOFOL;  Surgeon: Lanelle Bal, DO;  Location: [facility];  Service: Endoscopy;  Laterality: N/A;   POLYPECTOMY  04/03/2023   Procedure: POLYPECTOMY;  Surgeon: Lanelle Bal, DO;  Location: [facility];  Service: Endoscopy;;   right leg surgery       Family History  Problem Relation Age of Onset   Diabetes Mother    Cancer - Colon Neg Hx    Colon polyps Neg Hx     Social History   Socioeconomic History   Marital status: Married    Spouse name: Not on file   Number of children: Not on file   Years of education: Not on file   Highest education level: Not on file  Occupational History   Not on file  Tobacco Use   Smoking status: Never    Passive exposure: Never   Smokeless tobacco: Never  Vaping Use   Vaping status: Never Used  Substance and Sexual Activity   Alcohol use: No   Drug use: No   Sexual activity: Not on file  Other Topics Concern   Not on file  Social History Narrative   Not on file   Social Determinants of  Health   Financial Resource Strain: Not on file  Food Insecurity: Not on file  Transportation Needs: Not on file  Physical Activity: Not on file  Stress: Not on file  Social Connections: Unknown (07/23/2022)   Received from [facility], [facility]   Social Network    Social Network: Not on file   Outpatient Encounter Medications as of 11/24/2023  Medication Sig   allopurinol (ZYLOPRIM) 100 MG tablet Take 300 mg by mouth daily.   aspirin EC 81 MG tablet Take 81 mg by mouth. As needed   atorvastatin (LIPITOR) 40 MG tablet Take 40 mg by mouth daily.   colchicine 0.6 MG tablet Take by mouth.   diclofenac Sodium (VOLTAREN) 1 % GEL Apply topically.   Dulaglutide (TRULICITY) 3 MG/0.5ML SOPN Inject 3 mg as directed once a week.   famotidine (PEPCID) 20 MG tablet Take 20 mg by mouth daily. prn   fluticasone (FLONASE) 50 MCG/ACT nasal spray    gabapentin (NEURONTIN) 300 MG capsule Take 300 mg by mouth 2 (two) times daily.   ketoconazole (NIZORAL) 2 % cream Apply 1 Application topically daily.   lisinopril-hydrochlorothiazide (PRINZIDE,ZESTORETIC) 20-25 MG per tablet Take 1 tablet by mouth daily.   Multiple Vitamin (MULTIVITAMIN WITH MINERALS) TABS tablet Take 1 tablet by mouth daily.   pantoprazole (PROTONIX)  40 MG tablet take 1 tablet once daily.   tamsulosin (FLOMAX) 0.4 MG CAPS capsule Take 0.4 mg by mouth 2 (two) times daily.   traMADol (ULTRAM) 50 MG tablet Take 50-100 mg by mouth 4 (four) times daily as needed.   No facility-administered encounter medications on file as of 11/24/2023.   ALLERGIES: Allergies  Allergen Reactions   Codeine Nausea And Vomiting   Aspirin Other (See Comments)    Other reaction(s): Abdominal Pain, Other (See Comments)    Ibuprofen Other (See Comments)    Other reaction(s): Abdominal Pain, Other (See Comments)    VACCINATION STATUS: Immunization History  Administered Date(s) Administered   Influenza,inj,Quad PF,6+ Mos 10/13/2017, 10/06/2019     Diabetes He presents for his follow-up diabetic visit. He has type 2 diabetes mellitus. His disease course has been stable. There are no hypoglycemic associated symptoms. Pertinent negatives for hypoglycemia include no confusion, headaches, pallor or seizures. Pertinent negatives for diabetes include no chest pain, no fatigue, no polydipsia, no polyphagia, no polyuria and no weakness. There are no hypoglycemic complications. Symptoms are improving. Diabetic complications include peripheral neuropathy. Risk factors for coronary artery disease include diabetes mellitus, dyslipidemia, hypertension, male sex, obesity and sedentary lifestyle. He is compliant with treatment most of the time. His weight is decreasing steadily. He is following a generally unhealthy diet. When asked about meal planning, he reported none. He has had a previous visit with a dietitian. He never participates in exercise. (He presents with controlled glycemic profile with point-of-care A1c of 6.9%.  He did not document any hypoglycemia.   ) An ACE inhibitor/angiotensin II receptor blocker is being taken. Eye exam is current.  Hyperlipidemia This is a chronic problem. The current episode started more than 1 year ago. The problem is uncontrolled. Exacerbating diseases include diabetes and obesity. Pertinent negatives include no chest pain, myalgias or shortness of breath. Current antihyperlipidemic treatment includes statins. Compliance problems include adherence to diet.  Risk factors for coronary artery disease include diabetes mellitus, dyslipidemia, hypertension, male sex, obesity and a sedentary lifestyle.  Hypertension This is a chronic problem. The current episode started more than 1 year ago. Pertinent negatives include no chest pain, headaches, neck pain, palpitations or shortness of breath. Risk factors for coronary artery disease include dyslipidemia, diabetes mellitus, obesity, male gender and sedentary lifestyle. Past  treatments include ACE inhibitors.     Review of systems Limited as above.   Objective:    BP (!) 94/56   Pulse 96   Ht 5\' 9"  (1.753 m)   Wt 245 lb 3.2 oz (111.2 kg)   BMI 36.21 kg/m   Wt Readings from Last 3 Encounters:  11/24/23 245 lb 3.2 oz (111.2 kg)  10/13/23 253 lb (114.8 kg)  08/18/23 254 lb 12.8 oz (115.6 kg)        Recent Results (from the past 2160 hour(s))  T4, Free     Status: None   Collection Time: 11/17/23  1:18 PM  Result Value Ref Range   Free T4 0.97 0.82 - 1.77 ng/dL  TSH     Status: None   Collection Time: 11/17/23  1:18 PM  Result Value Ref Range   TSH 3.110 0.450 - 4.500 uIU/mL  Lipid Panel     Status: Abnormal   Collection Time: 11/17/23  1:18 PM  Result Value Ref Range   Cholesterol, Total 152 100 - 199 mg/dL   Triglycerides 865 (H) 0 - 149 mg/dL   HDL 43 >78 mg/dL  VLDL Cholesterol Cal 30 5 - 40 mg/dL   LDL Chol Calc (NIH) 79 0 - 99 mg/dL   Chol/HDL Ratio 3.5 0.0 - 5.0 ratio    Comment:                                   T. Chol/HDL Ratio                                             Men  Women                               1/2 Avg.Risk  3.4    3.3                                   Avg.Risk  5.0    4.4                                2X Avg.Risk  9.6    7.1                                3X Avg.Risk 23.4   11.0   Comprehensive metabolic panel     Status: Abnormal   Collection Time: 11/17/23  1:18 PM  Result Value Ref Range   Glucose 107 (H) 70 - 99 mg/dL   BUN 14 8 - 27 mg/dL   Creatinine, Ser 9.81 (H) 0.76 - 1.27 mg/dL   eGFR 59 (L) >19 JY/NWG/9.56   BUN/Creatinine Ratio 11 10 - 24   Sodium 138 134 - 144 mmol/L   Potassium 3.8 3.5 - 5.2 mmol/L   Chloride 97 96 - 106 mmol/L   CO2 24 20 - 29 mmol/L   Calcium 9.8 8.6 - 10.2 mg/dL   Total Protein 7.5 6.0 - 8.5 g/dL   Albumin 4.5 3.9 - 4.9 g/dL   Globulin, Total 3.0 1.5 - 4.5 g/dL   Bilirubin Total 0.7 0.0 - 1.2 mg/dL   Alkaline Phosphatase 134 (H) 44 - 121 IU/L   AST 26 0 - 40  IU/L   ALT 37 0 - 44 IU/L  HgB A1c     Status: None   Collection Time: 11/24/23  1:43 PM  Result Value Ref Range   Hemoglobin A1C     HbA1c POC (<> result, manual entry)     HbA1c, POC (prediabetic range)     HbA1c, POC (controlled diabetic range) 6.9 0.0 - 7.0 %   Lipid Panel     Component Value Date/Time   CHOL 152 11/17/2023 1318   TRIG 178 (H) 11/17/2023 1318   HDL 43 11/17/2023 1318   CHOLHDL 3.5 11/17/2023 1318   CHOLHDL 4.1 10/06/2018 1310   VLDL 29 04/15/2016 0836   LDLCALC 79 11/17/2023 1318   LDLCALC 114 (H) 10/06/2018 1310    Assessment & Plan:   1. Diabetes mellitus without complication (HCC)  - His diabetes is  complicated by peripheral neuropathy , peripheral arterial disease, Charcot's deformities and patient remains at a high risk for more acute and chronic  complications of diabetes which include CAD, CVA, CKD, retinopathy, and neuropathy. These are all discussed in detail with the patient.  He presents with controlled glycemic profile with point-of-care A1c of 6.9%.  He did not document any hypoglycemia.    - Recent labs reviewed with him, showing normal renal function.  - I have re-counseled the patient on diet management and  and weight loss by adopting a carbohydrate restricted / protein rich  Diet.  He presents with fluctuating body weight. - he acknowledges that there is a room for improvement in his food and drink choices. - Suggestion is made for him to avoid simple carbohydrates  from his diet including Cakes, Sweet Desserts, Ice Cream, Soda (diet and regular), Sweet Tea, Candies, Chips, Cookies, Store Bought Juices, Alcohol in Excess of  1-2 drinks a day, Artificial Sweeteners,  Coffee Creamer, and "Sugar-free" Products, Lemonade. This will help patient to have more stable blood glucose profile and potentially avoid unintended weight gain.  - Patient is advised to stick to a routine mealtimes to eat 3 meals  a day and avoid unnecessary snacks ( to  snack only to correct hypoglycemia).  - I have approached patient with the following individualized plan to manage diabetes and patient agrees.  -He is advised to continue Trulicity 3 mg subcutaneously weekly.  Side effects and precautions discussed with him.   He is not on  metformin for now.  He will not need insulin nor glipizide at this time.    - Patient specific target  for A1c; LDL, HDL, Triglycerides, and  Waist Circumference were discussed in detail.  2) BP/HTN:  -His blood pressure is tightly controlled.  He is advised to take half tablet of lisinopril-HCTZ 20-25 mg for the next 7 days and may resume the full dose afterwards.   3) Lipids/HPL: His recent lipid panel revealed controlled LDL at 79.  He is advised to continue pravastatin 40 mg p.o. nightly.  Side effects and precautions discussed with him.   4)  Weight/Diet: His BMI is 3 36.21-a candidate for modest weight loss.  CDE consult in progress, exercise, and carbohydrates information provided.  5) Chronic Care/Health Maintenance:  -Patient is on ACEI/ARB and Statin medications and encouraged to continue to follow up with Ophthalmology, Podiatrist at least yearly or according to recommendations, and advised to  stay away from smoking. I have recommended yearly flu vaccine and pneumonia vaccination at least every 5 years; moderate intensity exercise for up to 150 minutes weekly; and  sleep for at least 7 hours a day.  History screening ABI was negative for PAD in March 2022.  His study will be repeated in March 2027, or sooner if needed.   - I advised patient to maintain close follow up with Richardean Chimera, MD for primary care needs.  I spent  26  minutes in the care of the patient today including review of labs from CMP, Lipids, Thyroid Function, Hematology (current and previous including abstractions from other facilities); face-to-face time discussing  his blood glucose readings/logs, discussing hypoglycemia and  hyperglycemia episodes and symptoms, medications doses, his options of short and long term treatment based on the latest standards of care / guidelines;  discussion about incorporating lifestyle medicine;  and documenting the encounter. Risk reduction counseling performed per USPSTF guidelines to reduce  obesity and cardiovascular risk factors.     Please refer to Patient Instructions for Blood Glucose Monitoring and Insulin/Medications Dosing Guide"  in media tab for additional information. Please  also refer to " Patient Self Inventory" in the Media  tab for reviewed elements of pertinent patient history.  [facility] participated in the discussions, expressed understanding, and voiced agreement with the above plans.  All questions were answered to his satisfaction. he is encouraged to contact clinic should he have any questions or concerns prior to his return visit.   Follow up plan: -Return in about 6 months (around 05/23/2024) for Bring Meter/CGM Device/Logs- A1c in Office.  Marquis Lunch, MD Phone: [phone]  Fax: [phone]  -  This note was partially dictated with voice recognition software. Similar sounding words can be transcribed inadequately or may not  be corrected upon review.  11/24/2023, 4:42 PM

## 2023-11-24 NOTE — Patient Instructions (Signed)
                                     Advice for Weight Management  -For most of us the best way to lose weight is by diet management. Generally speaking, diet management means consuming less calories intentionally which over time brings about progressive weight loss.  This can be achieved more effectively by avoiding ultra processed carbohydrates, processed meats, unhealthy fats.    It is critically important to know your numbers: how much calorie you are consuming and how much calorie you need. More importantly, our carbohydrates sources should be unprocessed naturally occurring  complex starch food items.  It is always important to balance nutrition also by  appropriate intake of proteins (mainly plant-based), healthy fats/oils, plenty of fruits and vegetables.   -The American College of Lifestyle Medicine (ACL M) recommends nutrition derived mostly from Whole Food, Plant Predominant Sources example an apple instead of applesauce or apple pie. Eat Plenty of vegetables, Mushrooms, fruits, Legumes, Whole Grains, Nuts, seeds in lieu of processed meats, processed snacks/pastries red meat, poultry, eggs.  Use only water or unsweetened tea for hydration.  The College also recommends the need to stay away from risky substances including alcohol, smoking; obtaining 7-9 hours of restorative sleep, at least 150 minutes of moderate intensity exercise weekly, importance of healthy social connections, and being mindful of stress and seek help when it is overwhelming.    -Sticking to a routine mealtime to eat 3 meals a day and avoiding unnecessary snacks is shown to have a big role in weight control. Under normal circumstances, the only time we burn stored energy is when we are hungry, so allow  some hunger to take place- hunger means no food between appropriate meal times, only water.  It is not advisable to starve.   -It is better to avoid simple carbohydrates including:  Cakes, Sweet Desserts, Ice Cream, Soda (diet and regular), Sweet Tea, Candies, Chips, Cookies, Store Bought Juices, Alcohol in Excess of  1-2 drinks a day, Lemonade,  Artificial Sweeteners, Doughnuts, Coffee Creamers, "Sugar-free" Products, etc, etc.  This is not a complete list.....    -Consulting with certified diabetes educators is proven to provide you with the most accurate and current information on diet.  Also, you may be  interested in discussing diet options/exchanges , we can schedule a visit with Calvin Shields, RDN, CDE for individualized nutrition education.  -Exercise: If you are able: 30 -60 minutes a day ,4 days a week, or 150 minutes of moderate intensity exercise weekly.    The longer the better if tolerated.  Combine stretch, strength, and aerobic activities.  If you were told in the past that you have high risk for cardiovascular diseases, or if you are currently symptomatic, you may seek evaluation by your heart doctor prior to initiating moderate to intense exercise programs.                                  Additional Care Considerations for Diabetes/Prediabetes   -Diabetes  is a chronic disease.  The most important care consideration is regular follow-up with your diabetes care provider with the goal being avoiding or delaying its complications and to take advantage of advances in medications and technology.  If appropriate actions are taken early enough, type 2 diabetes can even be   reversed.  Seek information from the right source.  - Whole Food, Plant Predominant Nutrition is highly recommended: Eat Plenty of vegetables, Mushrooms, fruits, Legumes, Whole Grains, Nuts, seeds in lieu of processed meats, processed snacks/pastries red meat, poultry, eggs as recommended by American College of  Lifestyle Medicine (ACLM).  -Type 2 diabetes is known to coexist with other important comorbidities such as high blood pressure and high cholesterol.  It is critical to control not only the  diabetes but also the high blood pressure and high cholesterol to minimize and delay the risk of complications including coronary artery disease, stroke, amputations, blindness, etc.  The good news is that this diet recommendation for type 2 diabetes is also very helpful for managing high cholesterol and high blood blood pressure.  - Studies showed that people with diabetes will benefit from a class of medications known as ACE inhibitors and statins.  Unless there are specific reasons not to be on these medications, the standard of care is to consider getting one from these groups of medications at an optimal doses.  These medications are generally considered safe and proven to help protect the heart and the kidneys.    - People with diabetes are encouraged to initiate and maintain regular follow-up with eye doctors, foot doctors, dentists , and if necessary heart and kidney doctors.     - It is highly recommended that people with diabetes quit smoking or stay away from smoking, and get yearly  flu vaccine and pneumonia vaccine at least every 5 years.  See above for additional recommendations on exercise, sleep, stress management , and healthy social connections.      

## 2023-12-01 DIAGNOSIS — Z6834 Body mass index (BMI) 34.0-34.9, adult: Secondary | ICD-10-CM | POA: Diagnosis not present

## 2023-12-01 DIAGNOSIS — M5416 Radiculopathy, lumbar region: Secondary | ICD-10-CM | POA: Diagnosis not present

## 2023-12-01 DIAGNOSIS — R030 Elevated blood-pressure reading, without diagnosis of hypertension: Secondary | ICD-10-CM | POA: Diagnosis not present

## 2023-12-05 DIAGNOSIS — M6281 Muscle weakness (generalized): Secondary | ICD-10-CM | POA: Diagnosis not present

## 2023-12-05 DIAGNOSIS — M25552 Pain in left hip: Secondary | ICD-10-CM | POA: Diagnosis not present

## 2023-12-05 DIAGNOSIS — M545 Low back pain, unspecified: Secondary | ICD-10-CM | POA: Diagnosis not present

## 2023-12-08 DIAGNOSIS — M6281 Muscle weakness (generalized): Secondary | ICD-10-CM | POA: Diagnosis not present

## 2023-12-08 DIAGNOSIS — M25552 Pain in left hip: Secondary | ICD-10-CM | POA: Diagnosis not present

## 2023-12-08 DIAGNOSIS — M545 Low back pain, unspecified: Secondary | ICD-10-CM | POA: Diagnosis not present

## 2023-12-11 DIAGNOSIS — M25552 Pain in left hip: Secondary | ICD-10-CM | POA: Diagnosis not present

## 2023-12-11 DIAGNOSIS — M545 Low back pain, unspecified: Secondary | ICD-10-CM | POA: Diagnosis not present

## 2023-12-11 DIAGNOSIS — M6281 Muscle weakness (generalized): Secondary | ICD-10-CM | POA: Diagnosis not present

## 2023-12-15 DIAGNOSIS — M6281 Muscle weakness (generalized): Secondary | ICD-10-CM | POA: Diagnosis not present

## 2023-12-15 DIAGNOSIS — M545 Low back pain, unspecified: Secondary | ICD-10-CM | POA: Diagnosis not present

## 2023-12-15 DIAGNOSIS — M25552 Pain in left hip: Secondary | ICD-10-CM | POA: Diagnosis not present

## 2023-12-16 DIAGNOSIS — M5416 Radiculopathy, lumbar region: Secondary | ICD-10-CM | POA: Diagnosis not present

## 2023-12-16 DIAGNOSIS — M48061 Spinal stenosis, lumbar region without neurogenic claudication: Secondary | ICD-10-CM | POA: Diagnosis not present

## 2023-12-16 DIAGNOSIS — M4807 Spinal stenosis, lumbosacral region: Secondary | ICD-10-CM | POA: Diagnosis not present

## 2023-12-16 DIAGNOSIS — M47817 Spondylosis without myelopathy or radiculopathy, lumbosacral region: Secondary | ICD-10-CM | POA: Diagnosis not present

## 2023-12-16 DIAGNOSIS — M5126 Other intervertebral disc displacement, lumbar region: Secondary | ICD-10-CM | POA: Diagnosis not present

## 2023-12-17 DIAGNOSIS — M545 Low back pain, unspecified: Secondary | ICD-10-CM | POA: Diagnosis not present

## 2023-12-17 DIAGNOSIS — M25552 Pain in left hip: Secondary | ICD-10-CM | POA: Diagnosis not present

## 2023-12-17 DIAGNOSIS — M6281 Muscle weakness (generalized): Secondary | ICD-10-CM | POA: Diagnosis not present

## 2023-12-22 DIAGNOSIS — M25552 Pain in left hip: Secondary | ICD-10-CM | POA: Diagnosis not present

## 2023-12-22 DIAGNOSIS — M6281 Muscle weakness (generalized): Secondary | ICD-10-CM | POA: Diagnosis not present

## 2023-12-22 DIAGNOSIS — M545 Low back pain, unspecified: Secondary | ICD-10-CM | POA: Diagnosis not present

## 2024-01-01 DIAGNOSIS — M25562 Pain in left knee: Secondary | ICD-10-CM | POA: Diagnosis not present

## 2024-01-01 DIAGNOSIS — M21162 Varus deformity, not elsewhere classified, left knee: Secondary | ICD-10-CM | POA: Diagnosis not present

## 2024-01-01 DIAGNOSIS — G8929 Other chronic pain: Secondary | ICD-10-CM | POA: Diagnosis not present

## 2024-01-01 DIAGNOSIS — M1712 Unilateral primary osteoarthritis, left knee: Secondary | ICD-10-CM | POA: Diagnosis not present

## 2024-01-12 DIAGNOSIS — M5126 Other intervertebral disc displacement, lumbar region: Secondary | ICD-10-CM | POA: Diagnosis not present

## 2024-01-12 DIAGNOSIS — Z6835 Body mass index (BMI) 35.0-35.9, adult: Secondary | ICD-10-CM | POA: Diagnosis not present

## 2024-01-13 ENCOUNTER — Other Ambulatory Visit: Payer: Self-pay | Admitting: Neurosurgery

## 2024-01-14 ENCOUNTER — Other Ambulatory Visit: Payer: Self-pay | Admitting: Neurosurgery

## 2024-01-15 ENCOUNTER — Encounter (HOSPITAL_COMMUNITY): Payer: Self-pay | Admitting: Neurosurgery

## 2024-01-15 DIAGNOSIS — S83282A Other tear of lateral meniscus, current injury, left knee, initial encounter: Secondary | ICD-10-CM | POA: Diagnosis not present

## 2024-01-15 DIAGNOSIS — M7122 Synovial cyst of popliteal space [Baker], left knee: Secondary | ICD-10-CM | POA: Diagnosis not present

## 2024-01-15 DIAGNOSIS — S83232A Complex tear of medial meniscus, current injury, left knee, initial encounter: Secondary | ICD-10-CM | POA: Diagnosis not present

## 2024-01-15 DIAGNOSIS — M1712 Unilateral primary osteoarthritis, left knee: Secondary | ICD-10-CM | POA: Diagnosis not present

## 2024-01-15 NOTE — Progress Notes (Signed)
SDW call  Patient was given pre-op instructions over the phone. Patient verbalized understanding of instructions provided.     PCP - Dr. Donzetta Sprung Cardiologist -  Pulmonary:    PPM/ICD - denies Device Orders - na Rep Notified - na   Chest x-ray - na EKG -  03/26/2023 Stress Test - ECHO -  Cardiac Cath -   Sleep Study/sleep apnea/CPAP: denies  Type II diabetic Fasting Blood sugar range: 93-104 How often check sugars: daily GLP1: Trulicity, states last injection was 01/13/2024   Blood Thinner Instructions: denies Aspirin Instructions:denies   ERAS Protcol - NPO   Anesthesia review: Yes.  DM, HTN, high cholesterol, on GLP1   Patient denies shortness of breath, fever, cough and chest pain over the phone call  Your procedure is scheduled on Friday January 16, 2024  Report to [facility] Main Entrance "A" at  1415  A.M., then check in with the Admitting office.  Call this number if you have problems the morning of surgery:  [phone]   If you have any questions prior to your surgery date call [phone]: Open Monday-Friday 8am-4pm If you experience any cold or flu symptoms such as cough, fever, chills, shortness of breath, etc. between now and your scheduled surgery, please notify us at the above number    Remember:  Do not eat or drink after midnight the night before your surgery  Take these medicines the morning of surgery with A SIP OF WATER:  Allopurinol, protonix  As needed: Tylenol, colchicine, robaxin, ultram  As of today, STOP taking any Aspirin (unless otherwise instructed by your surgeon) Aleve, Naproxen, Ibuprofen, Motrin, Advil, Goody's, BC's, all herbal medications, fish oil, and all vitamins.  This includes your Voltaren gel

## 2024-01-15 NOTE — Anesthesia Preprocedure Evaluation (Addendum)
Anesthesia Evaluation  Patient identified by MRN, date of birth, ID band Patient awake    Reviewed: Allergy & Precautions, H&P , NPO status , Patient's Chart, lab work & pertinent test results, reviewed documented beta blocker date and time   Airway Mallampati: II  TM Distance: >3 FB Neck ROM: Full    Dental  (+) Dental Advisory Given, Poor Dentition, Chipped, Missing   Pulmonary neg pulmonary ROS   Pulmonary exam normal breath sounds clear to auscultation       Cardiovascular Exercise Tolerance: Good hypertension, Pt. on medications Normal cardiovascular exam Rhythm:Regular Rate:Normal     Neuro/Psych negative neurological ROS  negative psych ROS   GI/Hepatic Neg liver ROS,GERD  Controlled and Medicated,,  Endo/Other  negative endocrine ROSdiabetes    Renal/GU negative Renal ROS     Musculoskeletal  (+) Arthritis ,    Abdominal  (+) + obese  Peds  Hematology  (+) Blood dyscrasia, anemia   Anesthesia Other Findings   Reproductive/Obstetrics negative OB ROS                             Anesthesia Physical Anesthesia Plan  ASA: 3  Anesthesia Plan: General   Post-op Pain Management: Tylenol PO (pre-op)*   Induction: Intravenous  PONV Risk Score and Plan: 3 and Ondansetron, Dexamethasone and Treatment may vary due to age or medical condition  Airway Management Planned: Oral ETT  Additional Equipment:   Intra-op Plan:   Post-operative Plan: Extubation in OR  Informed Consent: I have reviewed the patients History and Physical, chart, labs and discussed the procedure including the risks, benefits and alternatives for the proposed anesthesia with the patient or authorized representative who has indicated his/her understanding and acceptance.     Dental advisory given  Plan Discussed with: CRNA  Anesthesia Plan Comments: (PAT note by Antionette Poles, PA-C: 68 year old male with  pertinent history including HTN, non-insulin-dependent DM2 (A1c 6.9 on 11/24/23), HLD.  Patient was admitted in July 2023 at [facility] due to episode of syncope. Echocardiogram showed a normal ejection fraction with no other abnormalities other than mild pulmonary hypertension. Carotid Doppler exam was normal.  Episode was felt secondary to heat syncope (reportedly was working in in the room between 100 to 110 degrees prior to syncopal).  Last dose of Trulicity 01/13/2024.  Patient will need day of surgery labs and evaluation.  03/26/2023: Normal sinus rhythm.  Rate 86.  T wave abnormality, consider inferior ischemia. When compared with ECG of 03-Sep-2017 07:16, No significant change since last tracing  MRI L-spine 12/16/2023 (Care Everywhere): 1. Severe L5-S1 spinal canal stenosis with moderate right and severe  left neural foraminal stenosis.  2. Moderate bilateral L4-L5 neural foraminal stenosis.  3. Focus of low T1-weighted signal dorsal to the L5-S1 disc that  could be a sequestered disc fragment.   TTE 07/24/2022 (Care Everywhere): Summary  1. The left ventricle is normal in size with mildly increased wall  thickness.  2. The left ventricular systolic function is normal, LVEF is visually  estimated at > 55%.  3. The left atrium is mildly dilated in size.  4. The right ventricle is normal in size, with normal systolic function.  5. There is mild pulmonary hypertension.   Carotid duplex 07/24/2022 (Care Everywhere): Color duplex indicates minimal heterogeneous plaque, with no  hemodynamically significant stenosis by duplex criteria in the  extracranial cerebrovascular circulation.    )  Anesthesia Quick Evaluation

## 2024-01-15 NOTE — Progress Notes (Signed)
Anesthesia Chart Review: Same day workup  68 year old male with pertinent history including HTN, non-insulin-dependent DM2 (A1c 6.9 on 11/24/23), HLD.  Patient was admitted in July 2023 at [facility] due to episode of syncope. Echocardiogram showed a normal ejection fraction with no other abnormalities other than mild pulmonary hypertension. Carotid Doppler exam was normal.  Episode was felt secondary to heat syncope (reportedly was working in in the room between 100 to 110 degrees prior to syncopal).  Last dose of Trulicity 01/13/2024.  Patient will need day of surgery labs and evaluation.  03/26/2023: Normal sinus rhythm.  Rate 86.  T wave abnormality, consider inferior ischemia. When compared with ECG of 03-Sep-2017 07:16, No significant change since last tracing  MRI L-spine 12/16/2023 (Care Everywhere): 1. Severe L5-S1 spinal canal stenosis with moderate right and severe  left neural foraminal stenosis.  2. Moderate bilateral L4-L5 neural foraminal stenosis.  3. Focus of low T1-weighted signal dorsal to the L5-S1 disc that  could be a sequestered disc fragment.   TTE 07/24/2022 (Care Everywhere): Summary   1. The left ventricle is normal in size with mildly increased wall  thickness.   2. The left ventricular systolic function is normal, LVEF is visually  estimated at > 55%.    3. The left atrium is mildly dilated in size.    4. The right ventricle is normal in size, with normal systolic function.    5. There is mild pulmonary hypertension.   Carotid duplex 07/24/2022 (Care Everywhere): Color duplex indicates minimal heterogeneous plaque, with no  hemodynamically significant stenosis by duplex criteria in the  extracranial cerebrovascular circulation.     Zannie Cove [facility]/Anesthesiology Phone [phone] 01/15/2024 12:57 PM

## 2024-01-16 ENCOUNTER — Ambulatory Visit (HOSPITAL_COMMUNITY)
Admission: RE | Admit: 2024-01-16 | Discharge: 2024-01-17 | Disposition: A | Discharge status: Home / Self Care | Payer: Medicare HMO | Attending: Neurosurgery | Admitting: Neurosurgery

## 2024-01-16 ENCOUNTER — Ambulatory Visit (HOSPITAL_COMMUNITY): Payer: Medicare HMO | Admitting: Physician Assistant

## 2024-01-16 ENCOUNTER — Encounter (HOSPITAL_COMMUNITY)
Admission: RE | Disposition: A | Discharge status: Home / Self Care | Payer: Self-pay | Source: Home / Self Care | Attending: Neurosurgery

## 2024-01-16 ENCOUNTER — Encounter (HOSPITAL_COMMUNITY): Payer: Self-pay | Admitting: Neurosurgery

## 2024-01-16 ENCOUNTER — Other Ambulatory Visit: Payer: Self-pay

## 2024-01-16 ENCOUNTER — Ambulatory Visit (HOSPITAL_COMMUNITY): Payer: Medicare HMO

## 2024-01-16 ENCOUNTER — Ambulatory Visit (HOSPITAL_BASED_OUTPATIENT_CLINIC_OR_DEPARTMENT_OTHER): Payer: Medicare HMO | Admitting: Physician Assistant

## 2024-01-16 DIAGNOSIS — M5127 Other intervertebral disc displacement, lumbosacral region: Secondary | ICD-10-CM | POA: Insufficient documentation

## 2024-01-16 DIAGNOSIS — Z7985 Long-term (current) use of injectable non-insulin antidiabetic drugs: Secondary | ICD-10-CM | POA: Diagnosis not present

## 2024-01-16 DIAGNOSIS — E119 Type 2 diabetes mellitus without complications: Secondary | ICD-10-CM | POA: Insufficient documentation

## 2024-01-16 DIAGNOSIS — K219 Gastro-esophageal reflux disease without esophagitis: Secondary | ICD-10-CM | POA: Insufficient documentation

## 2024-01-16 DIAGNOSIS — M4807 Spinal stenosis, lumbosacral region: Secondary | ICD-10-CM | POA: Insufficient documentation

## 2024-01-16 DIAGNOSIS — M5126 Other intervertebral disc displacement, lumbar region: Secondary | ICD-10-CM | POA: Diagnosis not present

## 2024-01-16 DIAGNOSIS — E782 Mixed hyperlipidemia: Secondary | ICD-10-CM | POA: Diagnosis not present

## 2024-01-16 DIAGNOSIS — I10 Essential (primary) hypertension: Secondary | ICD-10-CM | POA: Insufficient documentation

## 2024-01-16 DIAGNOSIS — E785 Hyperlipidemia, unspecified: Secondary | ICD-10-CM | POA: Diagnosis not present

## 2024-01-16 DIAGNOSIS — Z9889 Other specified postprocedural states: Secondary | ICD-10-CM | POA: Diagnosis not present

## 2024-01-16 HISTORY — PX: LUMBAR LAMINECTOMY/DECOMPRESSION MICRODISCECTOMY: SHX5026

## 2024-01-16 LAB — CBC
HCT: 42.5 % (ref 39.0–52.0)
Hemoglobin: 14.6 g/dL (ref 13.0–17.0)
MCH: 31.1 pg (ref 26.0–34.0)
MCHC: 34.4 g/dL (ref 30.0–36.0)
MCV: 90.6 fL (ref 80.0–100.0)
Platelets: 382 10*3/uL (ref 150–400)
RBC: 4.69 MIL/uL (ref 4.22–5.81)
RDW: 13 % (ref 11.5–15.5)
WBC: 8.6 10*3/uL (ref 4.0–10.5)
nRBC: 0 % (ref 0.0–0.2)

## 2024-01-16 LAB — COMPREHENSIVE METABOLIC PANEL
ALT: 29 U/L (ref 0–44)
AST: 22 U/L (ref 15–41)
Albumin: 3.9 g/dL (ref 3.5–5.0)
Alkaline Phosphatase: 98 U/L (ref 38–126)
Anion gap: 12 (ref 5–15)
BUN: 18 mg/dL (ref 8–23)
CO2: 25 mmol/L (ref 22–32)
Calcium: 9.9 mg/dL (ref 8.9–10.3)
Chloride: 101 mmol/L (ref 98–111)
Creatinine, Ser: 1.37 mg/dL — ABNORMAL HIGH (ref 0.61–1.24)
GFR, Estimated: 56 mL/min — ABNORMAL LOW (ref >60)
Glucose, Bld: 121 mg/dL — ABNORMAL HIGH (ref 70–99)
Potassium: 3.2 mmol/L — ABNORMAL LOW (ref 3.5–5.1)
Sodium: 138 mmol/L (ref 135–145)
Total Bilirubin: 1 mg/dL (ref 0.0–1.2)
Total Protein: 8.2 g/dL — ABNORMAL HIGH (ref 6.5–8.1)

## 2024-01-16 LAB — GLUCOSE, CAPILLARY
Glucose-Capillary: 131 mg/dL — ABNORMAL HIGH (ref 70–99)
Glucose-Capillary: 87 mg/dL (ref 70–99)
Glucose-Capillary: 103 mg/dL — ABNORMAL HIGH (ref 70–99)
Glucose-Capillary: 185 mg/dL — ABNORMAL HIGH (ref 70–99)

## 2024-01-16 LAB — SURGICAL PCR SCREEN
MRSA, PCR: NEGATIVE
Staphylococcus aureus: NEGATIVE

## 2024-01-16 SURGERY — LUMBAR LAMINECTOMY/DECOMPRESSION MICRODISCECTOMY 1 LEVEL
Anesthesia: General | Laterality: Left

## 2024-01-16 MED ORDER — LIDOCAINE 2% (20 MG/ML) 5 ML SYRINGE
INTRAMUSCULAR | Status: AC
  Filled 2024-01-16: qty 5

## 2024-01-16 MED ORDER — SUCCINYLCHOLINE CHLORIDE 200 MG/10ML IV SOSY
PREFILLED_SYRINGE | INTRAVENOUS | Status: DC | PRN
  Administered 2024-01-16: 140 mg via INTRAVENOUS

## 2024-01-16 MED ORDER — ONDANSETRON HCL 4 MG/2ML IJ SOLN
INTRAMUSCULAR | Status: AC
  Filled 2024-01-16: qty 2

## 2024-01-16 MED ORDER — ALBUMIN HUMAN 5 % IV SOLN: INTRAVENOUS | Status: DC | PRN

## 2024-01-16 MED ORDER — HEPARIN SODIUM (PORCINE) 5000 UNIT/ML IJ SOLN: 5000.0000 [IU] | Freq: Three times a day (TID) | INTRAMUSCULAR | Status: DC

## 2024-01-16 MED ORDER — ACETAMINOPHEN 500 MG PO TABS
1000.0000 mg | ORAL_TABLET | Freq: Four times a day (QID) | ORAL | Status: DC
  Administered 2024-01-16 – 2024-01-17 (×2): 1000 mg via ORAL
  Filled 2024-01-16 (×2): qty 2

## 2024-01-16 MED ORDER — ZOLPIDEM TARTRATE 5 MG PO TABS: 5.0000 mg | ORAL_TABLET | Freq: Every evening | ORAL | Status: DC | PRN

## 2024-01-16 MED ORDER — THROMBIN 5000 UNITS EX SOLR: OROMUCOSAL | Status: DC | PRN

## 2024-01-16 MED ORDER — LIDOCAINE-EPINEPHRINE 0.5 %-1:200000 IJ SOLN
INTRAMUSCULAR | Status: AC
  Filled 2024-01-16: qty 50

## 2024-01-16 MED ORDER — ALLOPURINOL 300 MG PO TABS
300.0000 mg | ORAL_TABLET | Freq: Every day | ORAL | Status: DC
  Administered 2024-01-17: 300 mg via ORAL
  Filled 2024-01-16: qty 1

## 2024-01-16 MED ORDER — DEXAMETHASONE SODIUM PHOSPHATE 10 MG/ML IJ SOLN
INTRAMUSCULAR | Status: AC
  Filled 2024-01-16: qty 1

## 2024-01-16 MED ORDER — PANTOPRAZOLE SODIUM 40 MG PO TBEC
40.0000 mg | DELAYED_RELEASE_TABLET | Freq: Every day | ORAL | Status: DC
Start: 2024-01-17 — End: 2024-01-17
  Administered 2024-01-17: 40 mg via ORAL
  Filled 2024-01-16: qty 1

## 2024-01-16 MED ORDER — PHENYLEPHRINE HCL-NACL 20-0.9 MG/250ML-% IV SOLN
INTRAVENOUS | Status: DC | PRN
  Administered 2024-01-16: 50 ug/min via INTRAVENOUS

## 2024-01-16 MED ORDER — HYDROMORPHONE HCL 1 MG/ML IJ SOLN
INTRAMUSCULAR | Status: DC | PRN
  Administered 2024-01-16: .5 mg via INTRAVENOUS

## 2024-01-16 MED ORDER — ACETAMINOPHEN 325 MG PO TABS: 650.0000 mg | ORAL_TABLET | ORAL | Status: DC | PRN

## 2024-01-16 MED ORDER — ONDANSETRON HCL 4 MG/2ML IJ SOLN
INTRAMUSCULAR | Status: DC | PRN
  Administered 2024-01-16: 4 mg via INTRAVENOUS

## 2024-01-16 MED ORDER — HYDROMORPHONE HCL 1 MG/ML IJ SOLN: 0.2500 mg | INTRAMUSCULAR | Status: DC | PRN

## 2024-01-16 MED ORDER — FENTANYL CITRATE (PF) 250 MCG/5ML IJ SOLN
INTRAMUSCULAR | Status: DC | PRN
  Administered 2024-01-16: 100 ug via INTRAVENOUS
  Administered 2024-01-16: 150 ug via INTRAVENOUS

## 2024-01-16 MED ORDER — POTASSIUM CHLORIDE IN NACL 20-0.9 MEQ/L-% IV SOLN: INTRAVENOUS | Status: DC

## 2024-01-16 MED ORDER — TIZANIDINE HCL 4 MG PO TABS: 4.0000 mg | ORAL_TABLET | Freq: Four times a day (QID) | ORAL | 0 refills | Status: DC | PRN

## 2024-01-16 MED ORDER — OXYCODONE HCL 5 MG PO TABS: 5.0000 mg | ORAL_TABLET | Freq: Four times a day (QID) | ORAL | 0 refills | Status: AC | PRN

## 2024-01-16 MED ORDER — ONDANSETRON HCL 4 MG/2ML IJ SOLN: 4.0000 mg | Freq: Four times a day (QID) | INTRAMUSCULAR | Status: DC | PRN

## 2024-01-16 MED ORDER — LACTATED RINGERS IV SOLN: INTRAVENOUS | Status: DC

## 2024-01-16 MED ORDER — SODIUM CHLORIDE 0.9% FLUSH: 3.0000 mL | Freq: Two times a day (BID) | INTRAVENOUS | Status: DC

## 2024-01-16 MED ORDER — OXYCODONE HCL 5 MG PO TABS: 5.0000 mg | ORAL_TABLET | ORAL | Status: DC | PRN

## 2024-01-16 MED ORDER — PROPOFOL 10 MG/ML IV BOLUS
INTRAVENOUS | Status: DC | PRN
  Administered 2024-01-16: 20 mg via INTRAVENOUS
  Administered 2024-01-16: 180 mg via INTRAVENOUS

## 2024-01-16 MED ORDER — FENTANYL CITRATE (PF) 100 MCG/2ML IJ SOLN
INTRAMUSCULAR | Status: DC | PRN
  Administered 2024-01-16: 100 ug via INTRAVENOUS

## 2024-01-16 MED ORDER — SODIUM CHLORIDE 0.9% FLUSH: 3.0000 mL | INTRAVENOUS | Status: DC | PRN

## 2024-01-16 MED ORDER — PHENOL 1.4 % MT LIQD: 1.0000 | OROMUCOSAL | Status: DC | PRN

## 2024-01-16 MED ORDER — PHENYLEPHRINE 80 MCG/ML (10ML) SYRINGE FOR IV PUSH (FOR BLOOD PRESSURE SUPPORT)
PREFILLED_SYRINGE | INTRAVENOUS | Status: AC
  Filled 2024-01-16: qty 10

## 2024-01-16 MED ORDER — HEMOSTATIC AGENTS (NO CHARGE) OPTIME
TOPICAL | Status: DC | PRN
  Administered 2024-01-16: 1 via TOPICAL

## 2024-01-16 MED ORDER — ATORVASTATIN CALCIUM 40 MG PO TABS
40.0000 mg | ORAL_TABLET | Freq: Every day | ORAL | Status: DC
  Administered 2024-01-16: 40 mg via ORAL
  Filled 2024-01-16: qty 1

## 2024-01-16 MED ORDER — DROPERIDOL 2.5 MG/ML IJ SOLN: 0.6250 mg | Freq: Once | INTRAMUSCULAR | Status: DC | PRN

## 2024-01-16 MED ORDER — CHLORHEXIDINE GLUCONATE CLOTH 2 % EX PADS: 6.0000 | MEDICATED_PAD | Freq: Once | CUTANEOUS | Status: DC

## 2024-01-16 MED ORDER — METHYLPREDNISOLONE ACETATE 80 MG/ML IJ SUSP
INTRAMUSCULAR | Status: AC
  Filled 2024-01-16: qty 1

## 2024-01-16 MED ORDER — ORAL CARE MOUTH RINSE: 15.0000 mL | Freq: Once | OROMUCOSAL | Status: AC

## 2024-01-16 MED ORDER — SODIUM CHLORIDE 0.9 % IV SOLN: 250.0000 mL | INTRAVENOUS | Status: DC

## 2024-01-16 MED ORDER — FENTANYL CITRATE (PF) 250 MCG/5ML IJ SOLN
INTRAMUSCULAR | Status: AC
  Filled 2024-01-16: qty 5

## 2024-01-16 MED ORDER — METHYLPREDNISOLONE ACETATE 80 MG/ML IJ SUSP
INTRAMUSCULAR | Status: DC | PRN
  Administered 2024-01-16: 80 mg

## 2024-01-16 MED ORDER — LIDOCAINE 2% (20 MG/ML) 5 ML SYRINGE
INTRAMUSCULAR | Status: DC | PRN
  Administered 2024-01-16: 80 mg via INTRAVENOUS

## 2024-01-16 MED ORDER — LISINOPRIL 20 MG PO TABS
20.0000 mg | ORAL_TABLET | Freq: Every day | ORAL | Status: DC
Start: 2024-01-17 — End: 2024-01-17
  Administered 2024-01-17: 20 mg via ORAL
  Filled 2024-01-16: qty 1

## 2024-01-16 MED ORDER — EPHEDRINE 5 MG/ML INJ
INTRAVENOUS | Status: AC
  Filled 2024-01-16: qty 5

## 2024-01-16 MED ORDER — MENTHOL 3 MG MT LOZG
1.0000 | LOZENGE | OROMUCOSAL | Status: DC | PRN
Start: 2024-01-16 — End: 2024-01-17

## 2024-01-16 MED ORDER — PROPOFOL 10 MG/ML IV BOLUS
INTRAVENOUS | Status: AC
  Filled 2024-01-16: qty 20

## 2024-01-16 MED ORDER — ROCURONIUM BROMIDE 10 MG/ML (PF) SYRINGE
PREFILLED_SYRINGE | INTRAVENOUS | Status: DC | PRN
  Administered 2024-01-16: 30 mg via INTRAVENOUS
  Administered 2024-01-16: 50 mg via INTRAVENOUS

## 2024-01-16 MED ORDER — CHLORHEXIDINE GLUCONATE 0.12 % MT SOLN
15.0000 mL | Freq: Once | OROMUCOSAL | Status: AC
  Administered 2024-01-16: 15 mL via OROMUCOSAL
  Filled 2024-01-16: qty 15

## 2024-01-16 MED ORDER — DEXAMETHASONE SODIUM PHOSPHATE 10 MG/ML IJ SOLN
INTRAMUSCULAR | Status: DC | PRN
  Administered 2024-01-16: 5 mg via INTRAVENOUS

## 2024-01-16 MED ORDER — ACETAMINOPHEN 650 MG RE SUPP: 650.0000 mg | RECTAL | Status: DC | PRN

## 2024-01-16 MED ORDER — OXYCODONE HCL ER 10 MG PO T12A
10.0000 mg | EXTENDED_RELEASE_TABLET | Freq: Two times a day (BID) | ORAL | Status: DC
  Administered 2024-01-16: 10 mg via ORAL
  Filled 2024-01-16: qty 1

## 2024-01-16 MED ORDER — ACETAMINOPHEN 500 MG PO TABS
1000.0000 mg | ORAL_TABLET | Freq: Once | ORAL | Status: AC
  Administered 2024-01-16: 1000 mg via ORAL
  Filled 2024-01-16: qty 2

## 2024-01-16 MED ORDER — ROCURONIUM BROMIDE 10 MG/ML (PF) SYRINGE
PREFILLED_SYRINGE | INTRAVENOUS | Status: AC
  Filled 2024-01-16: qty 10

## 2024-01-16 MED ORDER — ONDANSETRON HCL 4 MG PO TABS: 4.0000 mg | ORAL_TABLET | Freq: Four times a day (QID) | ORAL | Status: DC | PRN

## 2024-01-16 MED ORDER — METHOCARBAMOL 500 MG PO TABS
500.0000 mg | ORAL_TABLET | Freq: Two times a day (BID) | ORAL | Status: DC | PRN
  Administered 2024-01-16: 500 mg via ORAL
  Filled 2024-01-16: qty 1

## 2024-01-16 MED ORDER — THROMBIN 5000 UNITS EX SOLR
CUTANEOUS | Status: DC | PRN
  Administered 2024-01-16: 5000 [IU] via TOPICAL

## 2024-01-16 MED ORDER — OXYCODONE HCL 5 MG PO TABS
10.0000 mg | ORAL_TABLET | ORAL | Status: DC | PRN
Start: 2024-01-16 — End: 2024-01-17
  Administered 2024-01-17 (×2): 10 mg via ORAL
  Filled 2024-01-16 (×2): qty 2

## 2024-01-16 MED ORDER — HYDROMORPHONE HCL 1 MG/ML IJ SOLN
INTRAMUSCULAR | Status: AC
  Filled 2024-01-16: qty 0.5

## 2024-01-16 MED ORDER — THROMBIN 5000 UNITS EX SOLR
CUTANEOUS | Status: AC
  Filled 2024-01-16: qty 10000

## 2024-01-16 MED ORDER — INSULIN ASPART 100 UNIT/ML IJ SOLN: 0.0000 [IU] | INTRAMUSCULAR | Status: DC | PRN

## 2024-01-16 MED ORDER — HYDROCHLOROTHIAZIDE 25 MG PO TABS
25.0000 mg | ORAL_TABLET | Freq: Every day | ORAL | Status: DC
Start: 2024-01-17 — End: 2024-01-17
  Administered 2024-01-17: 25 mg via ORAL
  Filled 2024-01-16: qty 1

## 2024-01-16 MED ORDER — LIDOCAINE-EPINEPHRINE 0.5 %-1:200000 IJ SOLN
INTRAMUSCULAR | Status: DC | PRN
  Administered 2024-01-16 (×2): 10 mL via INTRADERMAL

## 2024-01-16 MED ORDER — 0.9 % SODIUM CHLORIDE (POUR BTL) OPTIME
TOPICAL | Status: DC | PRN
  Administered 2024-01-16: 1000 mL

## 2024-01-16 MED ORDER — ADULT MULTIVITAMIN W/MINERALS CH
1.0000 | ORAL_TABLET | Freq: Every day | ORAL | Status: DC
  Administered 2024-01-17: 1 via ORAL
  Filled 2024-01-16: qty 1

## 2024-01-16 MED ORDER — THROMBIN (RECOMBINANT) 5000 UNITS EX SOLR
CUTANEOUS | Status: AC
  Filled 2024-01-16: qty 10000

## 2024-01-16 MED ORDER — FENTANYL CITRATE (PF) 100 MCG/2ML IJ SOLN
INTRAMUSCULAR | Status: AC
  Filled 2024-01-16: qty 2

## 2024-01-16 MED ORDER — CEFAZOLIN SODIUM-DEXTROSE 2-4 GM/100ML-% IV SOLN
2.0000 g | INTRAVENOUS | Status: AC
  Administered 2024-01-16: 2 g via INTRAVENOUS
  Filled 2024-01-16: qty 100

## 2024-01-16 MED ORDER — SUGAMMADEX SODIUM 200 MG/2ML IV SOLN
INTRAVENOUS | Status: DC | PRN
  Administered 2024-01-16: 300 mg via INTRAVENOUS

## 2024-01-16 MED ORDER — LISINOPRIL-HYDROCHLOROTHIAZIDE 20-25 MG PO TABS: 1.0000 | ORAL_TABLET | Freq: Every day | ORAL | Status: DC

## 2024-01-16 SURGICAL SUPPLY — 47 items
BAG COUNTER SPONGE SURGICOUNT (BAG) ×1 IMPLANT
BENZOIN TINCTURE PRP APPL 2/3 (GAUZE/BANDAGES/DRESSINGS) IMPLANT
BLADE CLIPPER SURG (BLADE) IMPLANT
BUR MATCHSTICK NEURO 3.0 LAGG (BURR) ×1 IMPLANT
BUR PRECISION FLUTE 5.0 (BURR) IMPLANT
CANISTER SUCT 3000ML PPV (MISCELLANEOUS) ×1 IMPLANT
DERMABOND ADVANCED .7 DNX12 (GAUZE/BANDAGES/DRESSINGS) ×1 IMPLANT
DERMABOND ADVANCED .7 DNX6 (GAUZE/BANDAGES/DRESSINGS) IMPLANT
DRAPE LAPAROTOMY 100X72X124 (DRAPES) ×1 IMPLANT
DRAPE MICROSCOPE SLANT 54X150 (MISCELLANEOUS) ×1 IMPLANT
DRAPE SURG 17X23 STRL (DRAPES) ×1 IMPLANT
DURAPREP 26ML APPLICATOR (WOUND CARE) ×1 IMPLANT
ELECT BLADE 4.0 EZ CLEAN MEGAD (MISCELLANEOUS) ×1
ELECT REM PT RETURN 9FT ADLT (ELECTROSURGICAL) ×1
ELECTRODE BLDE 4.0 EZ CLN MEGD (MISCELLANEOUS) IMPLANT
ELECTRODE REM PT RTRN 9FT ADLT (ELECTROSURGICAL) ×1 IMPLANT
GAUZE 4X4 16PLY ~~LOC~~+RFID DBL (SPONGE) IMPLANT
GAUZE SPONGE 4X4 12PLY STRL (GAUZE/BANDAGES/DRESSINGS) IMPLANT
GLOVE ECLIPSE 6.5 STRL STRAW (GLOVE) ×1 IMPLANT
GLOVE EXAM NITRILE XL STR (GLOVE) IMPLANT
GOWN STRL REUS W/ TWL LRG LVL3 (GOWN DISPOSABLE) ×2 IMPLANT
GOWN STRL REUS W/ TWL XL LVL3 (GOWN DISPOSABLE) IMPLANT
GOWN STRL REUS W/TWL 2XL LVL3 (GOWN DISPOSABLE) IMPLANT
HEMOSTAT POWDER KIT SURGIFOAM (HEMOSTASIS) IMPLANT
KIT BASIN OR (CUSTOM PROCEDURE TRAY) ×1 IMPLANT
KIT TURNOVER KIT B (KITS) ×1 IMPLANT
NDL HYPO 18GX1.5 BLUNT FILL (NEEDLE) IMPLANT
NDL HYPO 25X1 1.5 SAFETY (NEEDLE) ×1 IMPLANT
NDL SPNL 18GX3.5 QUINCKE PK (NEEDLE) IMPLANT
NEEDLE HYPO 18GX1.5 BLUNT FILL (NEEDLE) ×1
NEEDLE HYPO 25X1 1.5 SAFETY (NEEDLE) ×1
NEEDLE SPNL 18GX3.5 QUINCKE PK (NEEDLE) ×1
NS IRRIG 1000ML POUR BTL (IV SOLUTION) ×1 IMPLANT
PACK LAMINECTOMY NEURO (CUSTOM PROCEDURE TRAY) ×1 IMPLANT
PAD ARMBOARD 7.5X6 YLW CONV (MISCELLANEOUS) ×3 IMPLANT
SPIKE FLUID TRANSFER (MISCELLANEOUS) ×1 IMPLANT
SPONGE SURGIFOAM ABS GEL 100 (HEMOSTASIS) IMPLANT
SPONGE SURGIFOAM ABS GEL SZ50 (HEMOSTASIS) ×1 IMPLANT
SPONGE T-LAP 4X18 ~~LOC~~+RFID (SPONGE) IMPLANT
STRIP CLOSURE SKIN 1/2X4 (GAUZE/BANDAGES/DRESSINGS) IMPLANT
SUT VIC AB 0 CT1 18XCR BRD8 (SUTURE) ×1 IMPLANT
SUT VIC AB 2-0 CT1 18 (SUTURE) ×1 IMPLANT
SUT VIC AB 3-0 SH 8-18 (SUTURE) ×1 IMPLANT
SYR 3ML LL SCALE MARK (SYRINGE) IMPLANT
TOWEL GREEN STERILE (TOWEL DISPOSABLE) ×1 IMPLANT
TOWEL GREEN STERILE FF (TOWEL DISPOSABLE) ×1 IMPLANT
WATER STERILE IRR 1000ML POUR (IV SOLUTION) ×1 IMPLANT

## 2024-01-16 NOTE — Discharge Instructions (Signed)
Lumbar Discectomy °Care After °A discectomy involves removal of discmaterial (the cartilage-like structures located between the bones of the back). It is done to relieve pressure on nerve roots. It can be used as a treatment for a back problem. The time in surgery depends on the findings in surgery and what is necessary to correct the problems. °HOME CARE INSTRUCTIONS  °· Check the cut (incision) made by the surgeon twice a day for signs of infection. Some signs of infection may include:  °· A foul smelling, greenish or yellowish discharge from the wound.  °· Increased pain.  °· Increased redness over the incision (operative) site.  °· The skin edges may separate.  °· Flu-like symptoms (problems).  °· A temperature above 101.5° F (38.6° C).  °· Change your bandages in about 24 to 36 hours following surgery or as directed.  °· You may shower tomrrow.  Avoid bathtubs, swimming pools and hot tubs for three weeks or until your incision has healed completely. °· Follow your doctor's instructions as to safe activities, exercises, and physical therapy.  °· Weight reduction may be beneficial if you are overweight.  °· Daily exercise is helpful to prevent the return of problems. Walking is permitted. You may use a treadmill without an incline. Cut down on activities and exercise if you have discomfort. You may also go up and down stairs as much as you can tolerate.  °· DO NOT lift anything heavier than 10 to 15 lbs. Avoid bending or twisting at the waist. Always bend your knees when lifting.  °· Maintain strength and range of motion as instructed.  °· Do not drive for 10 days, or as directed by your doctors. You may be a passenger . Lying back in the passenger seat may be more comfortable for you. Always wear a seatbelt.  °· Limit your sitting in a regular chair to 20 to 30 minutes at a time. There are no limitations for sitting in a recliner. You should lie down or walk in between sitting periods.  °· Only take  over-the-counter or prescription medicines for pain, discomfort, or fever as directed by your caregiver.  °SEEK MEDICAL CARE IF:  °· There is increased bleeding (more than a small spot) from the wound.  °· You notice redness, swelling, or increasing pain in the wound.  °· Pus is coming from wound.  °· You develop an unexplained oral temperature above 102° F (38.9° C) develops.  °· You notice a foul smell coming from the wound or dressing.  °· You have increasing pain in your wound.  °SEEK IMMEDIATE MEDICAL CARE IF:  °· You develop a rash.  °· You have difficulty breathing.  °· You develop any allergic problems to medicines given.  °Document Released: 11/20/2004 Document Revised: 12/05/2011 Document Reviewed: 03/10/2008 °ExitCare® Patient Information °

## 2024-01-16 NOTE — Discharge Summary (Signed)
  Physician Discharge Summary  Patient ID: Calvin Shields MRN: [id number] DOB/AGE: June 28, 1956 68 y.o.  Admit date: 01/16/2024 Discharge date: 01/16/2024  Admission Diagnoses:hnp Left L5/S1  Discharge Diagnoses: same Principal Problem:   HNP (herniated nucleus pulposus), lumbar   Discharged Condition: good  Hospital Course: Mr. Calvin Shields underwent an uncomplicated lumbar discetomy at L5/S1 on the left. Post op his left leg felt better. He had normal strength. He is voiding, and tolerating a regular diet. His wound is clean, dry, without signs of infection.  Treatments: surgery: as above  Discharge Exam: Blood pressure (!) 146/83, pulse (!) 103, temperature 98.3 F (36.8 C), temperature source Oral, resp. rate 20, height 5\' 9"  (1.753 m), weight 109.8 kg, SpO2 99%. General appearance: alert, cooperative, appears stated age, and no distress  Disposition: Discharge disposition: 01-Home or Self Care      Disc displacement, lumbar  Allergies as of 01/16/2024       Reactions   Codeine Nausea And Vomiting   Fish Allergy    Flares gout   Aspirin Other (See Comments)   Abdominal Pain   Ibuprofen Other (See Comments)   Abdominal Pain, tolerates taking with food         Medication List     TAKE these medications    acetaminophen 500 MG tablet Commonly known as: TYLENOL Take 1,000 mg by mouth every 6 (six) hours as needed for moderate pain (pain score 4-6).   allopurinol 100 MG tablet Commonly known as: ZYLOPRIM Take 300 mg by mouth daily.   atorvastatin 40 MG tablet Commonly known as: LIPITOR Take 40 mg by mouth at bedtime.   colchicine 0.6 MG tablet Take 0.6 mg by mouth daily as needed (gout).   diclofenac Sodium 1 % Gel Commonly known as: VOLTAREN Apply 1 Application topically 4 (four) times daily as needed (pain).   ibuprofen 200 MG tablet Commonly known as: ADVIL Take 400 mg by mouth every 6 (six) hours as needed for moderate pain (pain score 4-6).    lisinopril-hydrochlorothiazide 20-25 MG tablet Commonly known as: ZESTORETIC Take 1 tablet by mouth daily.   methocarbamol 500 MG tablet Commonly known as: ROBAXIN Take 500 mg by mouth 2 (two) times daily as needed for muscle spasms.   multivitamin with minerals Tabs tablet Take 1 tablet by mouth daily.   oxyCODONE 5 MG immediate release tablet Commonly known as: Oxy IR/ROXICODONE Take 1 tablet (5 mg total) by mouth every 6 (six) hours as needed for up to 8 days for moderate pain (pain score 4-6).   pantoprazole 40 MG tablet Commonly known as: PROTONIX take 1 tablet once daily.   tiZANidine 4 MG tablet Commonly known as: ZANAFLEX Take 1 tablet (4 mg total) by mouth every 6 (six) hours as needed for muscle spasms.   traMADol 50 MG tablet Commonly known as: ULTRAM Take 50 mg by mouth every 6 (six) hours as needed for moderate pain (pain score 4-6).   Trulicity 0.75 MG/0.5ML Soaj Generic drug: Dulaglutide Inject 0.75 mg into the skin every Tuesday.        Follow-up Information     Coletta Memos, MD Follow up.   Specialty: Neurosurgery Why: keep your scheduled appointment Contact information: [address] [place] [address] [phone]                 Signed: Coletta Memos 01/16/2024, 10:04 PM

## 2024-01-16 NOTE — Op Note (Signed)
01/16/2024  9:09 PM  PATIENT:  Calvin Shields  68 y.o. male With a Shields disc herniation at L5/S1 eccentric to the left PRE-OPERATIVE DIAGNOSIS:  Disc displacement, lumbar 5/S1  POST-OPERATIVE DIAGNOSIS:  Disc displacement, lumbar  PROCEDURE:  Procedure(s): Microdiscectomy - LUMBAR FIVE-SACRAL ONE-LEFT  SURGEON:   Surgeon(s): Coletta Memos, MD  ASSISTANTS:none  ANESTHESIA:   general  EBL:  Total I/O In: 250 [IV Piggyback:250] Out: 25 [Blood:25]  BLOOD ADMINISTERED:none  CELL SAVER GIVEN:none, not used  COUNT:correct per nursing  DRAINS: none   SPECIMEN:  No Specimen  DICTATION: Calvin Shields was taken to the operating room, intubated and placed under a general anesthetic without difficulty. He was positioned prone on a Wilson frame with all pressure points padded. His back was prepped and draped in a sterile manner. I opened the skin with a 10 blade and carried the dissection down to the thoracolumbar fascia. I used both sharp dissection and the monopolar cautery to expose the lamina of L5, and S1. I confirmed my location with an intraoperative xray.  I used the drill, Kerrison punches, and curettes to perform a semihemilaminectomy of L5. I used the punches to remove the ligamentum flavum to expose the thecal sac. I brought the microscope into the operative field and started the decompression of the spinal canal, thecal sac and 5 root(s). I cauterized epidural veins overlying the disc space then divided them sharply. I opened the disc space with a 15 blade and proceeded with the discectomy. I used pituitary rongeurs, curettes, and other instruments to remove disc material. After the discectomy was completed I inspected the left  S1 nerve root and felt it was well decompressed. I explored rostrally, laterally, medially, and caudally and was satisfied with the decompression. I irrigated the wound, then closed in layers. I approximated the thoracolumbar fascia, subcutaneous, and  subcuticular planes with vicryl sutures. I used dermabond for a sterile dressing.   PLAN OF CARE: Admit for overnight observation  PATIENT DISPOSITION:  PACU - hemodynamically stable.   Delay start of Pharmacological VTE agent (>24hrs) due to surgical blood loss or risk of bleeding:  yes

## 2024-01-16 NOTE — Transfer of Care (Signed)
Immediate Anesthesia Transfer of Care Note  Patient: [facility]  Procedure(s) Performed: Microdiscectomy - LUMBAR FIVE-SACRAL ONE-LEFT (Left)  Patient Location: PACU  Anesthesia Type:General  Level of Consciousness: awake and patient cooperative  Airway & Oxygen Therapy: Patient Spontanous Breathing and Patient connected to face mask oxygen  Post-op Assessment: Report given to RN and Post -op Vital signs reviewed and stable  Post vital signs: Reviewed and stable  Last Vitals:  Vitals Value Taken Time  BP 177/84 01/16/24 2055  Temp 37 C 01/16/24 2055  Pulse 100 01/16/24 2057  Resp 12 01/16/24 2057  SpO2 96 % 01/16/24 2057  Vitals shown include unfiled device data.  Last Pain:  Vitals:   01/16/24 2055  TempSrc:   PainSc: 0-No pain         Complications: No notable events documented.

## 2024-01-16 NOTE — H&P (Signed)
  BP 131/81 (BP Location: Right Arm)   Pulse (!) 108   Temp 99.1 F (37.3 C) (Oral)   Resp 18   Ht 5\' 9"  (1.753 m)   Wt 109.8 kg   SpO2 96%   BMI 35.74 kg/m  Calvin Shields presents today with a history of 34 months of pain in his lower back and left lower extremity.  He says he has had no trauma.  It came on on its own, but has been quite severe.  He has a great amount of difficulty sleeping and resting at night.  Says it goes from the back to the buttock, thigh and into the ankle.  He has never had pain like this in the past.  He has had physical therapy which was 3-4 weeks and it improved then it got worse again.  It flares up whenever he is active walking or doing any kind of exercise or work.  He says the pain is severe.  He weighs 247 pounds.  Temperature is 96.9, blood pressure is 116/72, pulse 84, pain is 9/10.  On his intake sheet, Calvin Shields states that he does not live alone.  He does have children.  He is not pregnant obviously. He does not use tobacco.  He does not vape.  He does not use alcohol.  He says the back pain is what is worse, 9/10.  He describes it as aching.  It is in the back and leg, equal amount.  He can sit and walk, both of which cause pain.  No arm weakness.  No bowel or bladder dysfunction.  He did home exercise in addition to the physical therapy.  He does have a history of hypertension, joint problems, diabetes, muscle pain, joint pain currently.  He has never been hospitalized or had any surgery, by his account.  MEDICATIONS: Lisinopril, Atorvastatin, Allopurinol, Pantoprazole, Tramadol, Famotidine, Methocarbamol, Magnesium Glycinate, Trulicity, Tamsulosin, Multivitamin 1 a day, and Iron 65 mg.  PHYSICAL EXAMINATION: He is alert, oriented by 4. He answers all questions appropriately.  Memory, language, attention span, and fund of knowledge are normal.  Speech is clear, it is also fluent.  Hearing intact to voice.  Uvula elevates in midline.  Shoulder shrug is normal.   Tongue protrudes in the midline. 5/5 strength in the upper extremities.  He has 5/5 strength in the right lower extremity.  Some mild weakness in the right plantar flexors.  Normal muscle tone, bulk, and coordination.  Romberg is negative.  He can toe walk.  He can heel walk and he can do a squat.  Also of note, he denies any bowel or bladder dysfunction.  IMAGING: MRI shows a very large disc herniation at L5-S1, essentially to the left side causing severe stenosis in the canal.  The conus is normal. Cauda equina is normal.  No tumors, masses, or infections.  ASSESSMENT AND PLAN: Calvin Shields has a large herniated disc at L5-S1 causing severe stenosis and compromise of the S1 nerve root.  I do believe this is the reason for his pain.  He says it is severe enough that he would like to proceed with treatment.  He has had conservative treatment which has not helped and he has had time.  I will see him in the operating room this coming Friday. Risks and benefits were explained and he also received a detailed sheet going over the operation.

## 2024-01-16 NOTE — Anesthesia Procedure Notes (Signed)
Procedure Name: Intubation Date/Time: 01/16/2024 6:31 PM  Performed by: Alwyn Ren, CRNAPre-anesthesia Checklist: Patient identified, Emergency Drugs available, Suction available and Patient being monitored Patient Re-evaluated:Patient Re-evaluated prior to induction Oxygen Delivery Method: Circle system utilized Preoxygenation: Pre-oxygenation with 100% oxygen Induction Type: IV induction Ventilation: Mask ventilation without difficulty Laryngoscope Size: Miller and 2 Grade View: Grade I Tube type: Oral Tube size: 7.0 mm Number of attempts: 1 Airway Equipment and Method: Stylet and Oral airway Placement Confirmation: ETT inserted through vocal cords under direct vision, positive ETCO2 and breath sounds checked- equal and bilateral Secured at: 22 cm Tube secured with: Tape Dental Injury: Teeth and Oropharynx as per pre-operative assessment

## 2024-01-16 NOTE — Anesthesia Postprocedure Evaluation (Signed)
Anesthesia Post Note  Patient: [name]  Procedure(s) Performed: Microdiscectomy - LUMBAR FIVE-SACRAL ONE-LEFT (Left)     Patient location during evaluation: PACU Anesthesia Type: General Level of consciousness: awake and alert Pain management: pain level controlled Vital Signs Assessment: post-procedure vital signs reviewed and stable Respiratory status: spontaneous breathing, nonlabored ventilation, respiratory function stable and patient connected to nasal cannula oxygen Cardiovascular status: blood pressure returned to baseline and stable Postop Assessment: no apparent nausea or vomiting Anesthetic complications: no   No notable events documented.  Last Vitals:  Vitals:   01/16/24 2130 01/16/24 2158  BP: (!) 151/75 (!) 146/83  Pulse: 93 (!) 103  Resp: 16 20  Temp: 36.9 C 36.8 C  SpO2: 94% 99%    Last Pain:  Vitals:   01/16/24 2158  TempSrc: Oral  PainSc:                  Nelle Don Senan Urey

## 2024-01-17 ENCOUNTER — Encounter (HOSPITAL_COMMUNITY): Payer: Self-pay | Admitting: Neurosurgery

## 2024-01-17 DIAGNOSIS — K219 Gastro-esophageal reflux disease without esophagitis: Secondary | ICD-10-CM | POA: Diagnosis not present

## 2024-01-17 DIAGNOSIS — M5127 Other intervertebral disc displacement, lumbosacral region: Secondary | ICD-10-CM | POA: Diagnosis not present

## 2024-01-17 DIAGNOSIS — E119 Type 2 diabetes mellitus without complications: Secondary | ICD-10-CM | POA: Diagnosis not present

## 2024-01-17 DIAGNOSIS — M4807 Spinal stenosis, lumbosacral region: Secondary | ICD-10-CM | POA: Diagnosis not present

## 2024-01-17 DIAGNOSIS — I10 Essential (primary) hypertension: Secondary | ICD-10-CM | POA: Diagnosis not present

## 2024-01-17 DIAGNOSIS — Z7985 Long-term (current) use of injectable non-insulin antidiabetic drugs: Secondary | ICD-10-CM | POA: Diagnosis not present

## 2024-01-17 LAB — CBC
HCT: 38 % — ABNORMAL LOW (ref 39.0–52.0)
Hemoglobin: 13.1 g/dL (ref 13.0–17.0)
MCH: 31.3 pg (ref 26.0–34.0)
MCHC: 34.5 g/dL (ref 30.0–36.0)
MCV: 90.7 fL (ref 80.0–100.0)
Platelets: 356 10*3/uL (ref 150–400)
RBC: 4.19 MIL/uL — ABNORMAL LOW (ref 4.22–5.81)
RDW: 13 % (ref 11.5–15.5)
WBC: 11.3 10*3/uL — ABNORMAL HIGH (ref 4.0–10.5)
nRBC: 0 % (ref 0.0–0.2)

## 2024-01-17 LAB — CREATININE, SERUM
Creatinine, Ser: 1.42 mg/dL — ABNORMAL HIGH (ref 0.61–1.24)
GFR, Estimated: 53 mL/min — ABNORMAL LOW (ref >60)

## 2024-01-17 LAB — GLUCOSE, CAPILLARY: Glucose-Capillary: 138 mg/dL — ABNORMAL HIGH (ref 70–99)

## 2024-01-17 NOTE — Progress Notes (Signed)
PT Cancellation Note  Patient Details Name: Lilbern Slacum MRN: [id number] DOB: 1956/09/24   Cancelled Treatment:    Reason Eval/Treat Not Completed: PT screened, no needs identified, will sign off. Per discussion with OT, pt at baseline mobility and has needed assist at home, no acute PT needs. Thank you for the consult.   Vickki Muff, PT, DPT   Acute Rehabilitation Department Office [phone] Secure Chat Communication Preferred   Ronnie Derby 01/17/2024, 9:24 AM

## 2024-01-17 NOTE — Care Management (Signed)
Patient with order to DC to home today. Unit staff to provide DME needed for home.   No HH needs identified Patient will have family/ friends provide transportation home. No other TOC needs identified for DC 

## 2024-01-17 NOTE — Evaluation (Signed)
Occupational Therapy Evaluation Patient Details Name: Calvin Shields MRN: [id number] DOB: 23-Aug-1956 Today's Date: 01/17/2024   History of Present Illness Pt is a 68 y/o M s/p L L5-S1 microdiscectomy on 1/17.   Clinical Impression   Pt ind at baseline with ADLs/functional mobility, lives with spouse and nephew who can assist at d/c. Pt currently needing CGA for ADL, mod I for bed mobility and CGA for transfers without AD. Pt educated on back precautions (handout provided) along with compensatory strategies for ADL/functional mobility. Pt verbalized and demo'd understanding. Pt presenting with impairments listed below, will follow acutely. Anticipate no OT follow up needs at d/c.       If plan is discharge home, recommend the following: A little help with walking and/or transfers;A little help with bathing/dressing/bathroom;Assistance with cooking/housework;Assist for transportation    Functional Status Assessment  Patient has had a recent decline in their functional status and demonstrates the ability to make significant improvements in function in a reasonable and predictable amount of time.  Equipment Recommendations  BSC/3in1    Recommendations for Other Services PT consult     Precautions / Restrictions Precautions Precautions: Back Precaution Booklet Issued: Yes (comment) Precaution Comments: educated on back prec Required Braces or Orthoses: Other Brace Other Brace: no brace needed per MD Restrictions Weight Bearing Restrictions Per Provider Order: No      Mobility Bed Mobility Overal bed mobility: Modified Independent             General bed mobility comments: use of log roll technique    Transfers Overall transfer level: Needs assistance Equipment used: None Transfers: Sit to/from Stand Sit to Stand: Contact guard assist                  Balance Overall balance assessment: Mild deficits observed, not formally tested                                          ADL either performed or assessed with clinical judgement   ADL Overall ADL's : Needs assistance/impaired Eating/Feeding: Set up;Sitting   Grooming: Set up;Sitting   Upper Body Bathing: Contact guard assist   Lower Body Bathing: Contact guard assist   Upper Body Dressing : Contact guard assist   Lower Body Dressing: Contact guard assist   Toilet Transfer: Contact guard assist   Toileting- Clothing Manipulation and Hygiene: Contact guard assist       Functional mobility during ADLs: Contact guard assist       Vision   Vision Assessment?: No apparent visual deficits     Perception Perception: Not tested       Praxis Praxis: Not tested       Pertinent Vitals/Pain Pain Assessment Pain Assessment: No/denies pain     Extremity/Trunk Assessment Upper Extremity Assessment Upper Extremity Assessment: Overall WFL for tasks assessed   Lower Extremity Assessment Lower Extremity Assessment: Overall WFL for tasks assessed   Cervical / Trunk Assessment Cervical / Trunk Assessment: Back Surgery   Communication Communication Communication: No apparent difficulties   Cognition Arousal: Alert Behavior During Therapy: WFL for tasks assessed/performed Overall Cognitive Status: Within Functional Limits for tasks assessed                                       General Comments  VSS  Exercises     Shoulder Instructions      Home Living Family/patient expects to be discharged to:: Private residence Living Arrangements: Spouse/significant other;Other relatives (nephew) Available Help at Discharge: Family;Available 24 hours/day Type of Home: House Home Access: Stairs to enter [employer] of Steps: 1 Entrance Stairs-Rails: Can reach both Home Layout: Multi-level;Able to live on main level with bedroom/bathroom Alternate Level Stairs-Number of Steps: flight   Bathroom Shower/Tub: Contractor: Standard     Home Equipment: Cane - single point          Prior Functioning/Environment Prior Level of Function : Independent/Modified Independent             Mobility Comments: no AD ADLs Comments: ind        OT Problem List: Decreased strength;Decreased range of motion;Decreased activity tolerance;Impaired balance (sitting and/or standing);Decreased knowledge of precautions      OT Treatment/Interventions: Self-care/ADL training;Therapeutic exercise;Energy conservation;DME and/or AE instruction;Therapeutic activities;Patient/family education;Balance training    OT Goals(Current goals can be found in the care plan section) Acute Rehab OT Goals Patient Stated Goal: none stated OT Goal Formulation: With patient Time For Goal Achievement: 01/31/24 Potential to Achieve Goals: Good  OT Frequency: Min 1X/week    Co-evaluation              AM-PAC OT "6 Clicks" Daily Activity     Outcome Measure Help from another person eating meals?: None Help from another person taking care of personal grooming?: None Help from another person toileting, which includes using toliet, bedpan, or urinal?: None Help from another person bathing (including washing, rinsing, drying)?: A Little Help from another person to put on and taking off regular upper body clothing?: A Little Help from another person to put on and taking off regular lower body clothing?: A Little 6 Click Score: 21   End of Session Nurse Communication: Mobility status  Activity Tolerance: Patient tolerated treatment well Patient left: in bed;with call bell/phone within reach  OT Visit Diagnosis: Unsteadiness on feet (R26.81);Other abnormalities of gait and mobility (R26.89);Muscle weakness (generalized) (M62.81)                Time: 1610-9604 OT Time Calculation (min): 32 min Charges:  OT General Charges $OT Visit: 1 Visit OT Evaluation $OT Eval Low Complexity: 1 Low OT Treatments $Self Care/Home  Management : 8-22 mins  Carver Fila, OTD, OTR/L SecureChat Preferred Acute Rehab [phone]   Cybele Maule K Koonce 01/17/2024, 9:08 AM

## 2024-01-17 NOTE — Progress Notes (Signed)
Patient alert and oriented, void, ambulate. D/c instructions explain and given. Surgical site clean and dry no sign of infection.

## 2024-01-19 DIAGNOSIS — E1142 Type 2 diabetes mellitus with diabetic polyneuropathy: Secondary | ICD-10-CM | POA: Diagnosis not present

## 2024-01-19 DIAGNOSIS — K219 Gastro-esophageal reflux disease without esophagitis: Secondary | ICD-10-CM | POA: Diagnosis not present

## 2024-01-19 DIAGNOSIS — Z13220 Encounter for screening for lipoid disorders: Secondary | ICD-10-CM | POA: Diagnosis not present

## 2024-01-19 DIAGNOSIS — N1831 Chronic kidney disease, stage 3a: Secondary | ICD-10-CM | POA: Diagnosis not present

## 2024-01-19 DIAGNOSIS — I10 Essential (primary) hypertension: Secondary | ICD-10-CM | POA: Diagnosis not present

## 2024-01-20 ENCOUNTER — Other Ambulatory Visit: Payer: Self-pay

## 2024-01-29 DIAGNOSIS — E1142 Type 2 diabetes mellitus with diabetic polyneuropathy: Secondary | ICD-10-CM | POA: Diagnosis not present

## 2024-01-29 DIAGNOSIS — N1831 Chronic kidney disease, stage 3a: Secondary | ICD-10-CM | POA: Diagnosis not present

## 2024-01-29 DIAGNOSIS — M21162 Varus deformity, not elsewhere classified, left knee: Secondary | ICD-10-CM | POA: Diagnosis not present

## 2024-01-29 DIAGNOSIS — I10 Essential (primary) hypertension: Secondary | ICD-10-CM | POA: Diagnosis not present

## 2024-01-29 DIAGNOSIS — Z23 Encounter for immunization: Secondary | ICD-10-CM | POA: Diagnosis not present

## 2024-01-29 DIAGNOSIS — J309 Allergic rhinitis, unspecified: Secondary | ICD-10-CM | POA: Diagnosis not present

## 2024-01-29 DIAGNOSIS — G8929 Other chronic pain: Secondary | ICD-10-CM | POA: Diagnosis not present

## 2024-01-29 DIAGNOSIS — M1712 Unilateral primary osteoarthritis, left knee: Secondary | ICD-10-CM | POA: Diagnosis not present

## 2024-01-29 DIAGNOSIS — Z01818 Encounter for other preprocedural examination: Secondary | ICD-10-CM | POA: Diagnosis not present

## 2024-01-29 DIAGNOSIS — Z6835 Body mass index (BMI) 35.0-35.9, adult: Secondary | ICD-10-CM | POA: Diagnosis not present

## 2024-02-27 DIAGNOSIS — M109 Gout, unspecified: Secondary | ICD-10-CM | POA: Diagnosis not present

## 2024-02-27 DIAGNOSIS — E1142 Type 2 diabetes mellitus with diabetic polyneuropathy: Secondary | ICD-10-CM | POA: Diagnosis not present

## 2024-02-27 DIAGNOSIS — E782 Mixed hyperlipidemia: Secondary | ICD-10-CM | POA: Diagnosis not present

## 2024-03-17 DIAGNOSIS — Z01818 Encounter for other preprocedural examination: Secondary | ICD-10-CM | POA: Diagnosis not present

## 2024-03-22 DIAGNOSIS — M1712 Unilateral primary osteoarthritis, left knee: Secondary | ICD-10-CM | POA: Diagnosis not present

## 2024-03-22 DIAGNOSIS — M25562 Pain in left knee: Secondary | ICD-10-CM | POA: Diagnosis not present

## 2024-03-22 DIAGNOSIS — M25362 Other instability, left knee: Secondary | ICD-10-CM | POA: Diagnosis not present

## 2024-03-22 DIAGNOSIS — G8929 Other chronic pain: Secondary | ICD-10-CM | POA: Diagnosis not present

## 2024-03-23 DIAGNOSIS — Z01818 Encounter for other preprocedural examination: Secondary | ICD-10-CM | POA: Diagnosis not present

## 2024-03-29 DIAGNOSIS — E1142 Type 2 diabetes mellitus with diabetic polyneuropathy: Secondary | ICD-10-CM | POA: Diagnosis not present

## 2024-03-29 DIAGNOSIS — M109 Gout, unspecified: Secondary | ICD-10-CM | POA: Diagnosis not present

## 2024-03-29 DIAGNOSIS — E782 Mixed hyperlipidemia: Secondary | ICD-10-CM | POA: Diagnosis not present

## 2024-03-30 HISTORY — PX: REPLACEMENT TOTAL KNEE: SUR1224

## 2024-03-31 DIAGNOSIS — M65862 Other synovitis and tenosynovitis, left lower leg: Secondary | ICD-10-CM | POA: Diagnosis not present

## 2024-03-31 DIAGNOSIS — Z9181 History of falling: Secondary | ICD-10-CM | POA: Diagnosis not present

## 2024-03-31 DIAGNOSIS — M6281 Muscle weakness (generalized): Secondary | ICD-10-CM | POA: Diagnosis not present

## 2024-03-31 DIAGNOSIS — R269 Unspecified abnormalities of gait and mobility: Secondary | ICD-10-CM | POA: Diagnosis not present

## 2024-03-31 DIAGNOSIS — R600 Localized edema: Secondary | ICD-10-CM | POA: Diagnosis not present

## 2024-03-31 DIAGNOSIS — Z794 Long term (current) use of insulin: Secondary | ICD-10-CM | POA: Diagnosis not present

## 2024-03-31 DIAGNOSIS — G8929 Other chronic pain: Secondary | ICD-10-CM | POA: Diagnosis not present

## 2024-03-31 DIAGNOSIS — E119 Type 2 diabetes mellitus without complications: Secondary | ICD-10-CM | POA: Diagnosis not present

## 2024-03-31 DIAGNOSIS — Z7985 Long-term (current) use of injectable non-insulin antidiabetic drugs: Secondary | ICD-10-CM | POA: Diagnosis not present

## 2024-03-31 DIAGNOSIS — Z96652 Presence of left artificial knee joint: Secondary | ICD-10-CM | POA: Diagnosis not present

## 2024-03-31 DIAGNOSIS — Z7409 Other reduced mobility: Secondary | ICD-10-CM | POA: Diagnosis not present

## 2024-03-31 DIAGNOSIS — Z1152 Encounter for screening for COVID-19: Secondary | ICD-10-CM | POA: Diagnosis not present

## 2024-03-31 DIAGNOSIS — I10 Essential (primary) hypertension: Secondary | ICD-10-CM | POA: Diagnosis not present

## 2024-03-31 DIAGNOSIS — M1712 Unilateral primary osteoarthritis, left knee: Secondary | ICD-10-CM | POA: Diagnosis not present

## 2024-03-31 DIAGNOSIS — Z471 Aftercare following joint replacement surgery: Secondary | ICD-10-CM | POA: Diagnosis not present

## 2024-04-01 DIAGNOSIS — Z7409 Other reduced mobility: Secondary | ICD-10-CM | POA: Diagnosis not present

## 2024-04-01 DIAGNOSIS — M1712 Unilateral primary osteoarthritis, left knee: Secondary | ICD-10-CM | POA: Diagnosis not present

## 2024-04-01 DIAGNOSIS — M6281 Muscle weakness (generalized): Secondary | ICD-10-CM | POA: Diagnosis not present

## 2024-04-01 DIAGNOSIS — M65862 Other synovitis and tenosynovitis, left lower leg: Secondary | ICD-10-CM | POA: Diagnosis not present

## 2024-04-01 DIAGNOSIS — R269 Unspecified abnormalities of gait and mobility: Secondary | ICD-10-CM | POA: Diagnosis not present

## 2024-04-01 DIAGNOSIS — G8929 Other chronic pain: Secondary | ICD-10-CM | POA: Diagnosis not present

## 2024-04-01 DIAGNOSIS — Z1152 Encounter for screening for COVID-19: Secondary | ICD-10-CM | POA: Diagnosis not present

## 2024-04-01 DIAGNOSIS — Z9181 History of falling: Secondary | ICD-10-CM | POA: Diagnosis not present

## 2024-04-01 DIAGNOSIS — E119 Type 2 diabetes mellitus without complications: Secondary | ICD-10-CM | POA: Diagnosis not present

## 2024-04-02 DIAGNOSIS — R269 Unspecified abnormalities of gait and mobility: Secondary | ICD-10-CM | POA: Diagnosis not present

## 2024-04-02 DIAGNOSIS — M1712 Unilateral primary osteoarthritis, left knee: Secondary | ICD-10-CM | POA: Diagnosis not present

## 2024-04-02 DIAGNOSIS — G8929 Other chronic pain: Secondary | ICD-10-CM | POA: Diagnosis not present

## 2024-04-02 DIAGNOSIS — M6281 Muscle weakness (generalized): Secondary | ICD-10-CM | POA: Diagnosis not present

## 2024-04-02 DIAGNOSIS — E119 Type 2 diabetes mellitus without complications: Secondary | ICD-10-CM | POA: Diagnosis not present

## 2024-04-02 DIAGNOSIS — Z794 Long term (current) use of insulin: Secondary | ICD-10-CM | POA: Diagnosis not present

## 2024-04-02 DIAGNOSIS — M65862 Other synovitis and tenosynovitis, left lower leg: Secondary | ICD-10-CM | POA: Diagnosis not present

## 2024-04-02 DIAGNOSIS — Z7409 Other reduced mobility: Secondary | ICD-10-CM | POA: Diagnosis not present

## 2024-04-02 DIAGNOSIS — R509 Fever, unspecified: Secondary | ICD-10-CM | POA: Diagnosis not present

## 2024-04-02 DIAGNOSIS — R0989 Other specified symptoms and signs involving the circulatory and respiratory systems: Secondary | ICD-10-CM | POA: Diagnosis not present

## 2024-04-02 DIAGNOSIS — Z1152 Encounter for screening for COVID-19: Secondary | ICD-10-CM | POA: Diagnosis not present

## 2024-04-02 DIAGNOSIS — Z96651 Presence of right artificial knee joint: Secondary | ICD-10-CM | POA: Diagnosis not present

## 2024-04-02 DIAGNOSIS — Z9181 History of falling: Secondary | ICD-10-CM | POA: Diagnosis not present

## 2024-04-03 DIAGNOSIS — M1712 Unilateral primary osteoarthritis, left knee: Secondary | ICD-10-CM | POA: Diagnosis not present

## 2024-04-03 DIAGNOSIS — M6281 Muscle weakness (generalized): Secondary | ICD-10-CM | POA: Diagnosis not present

## 2024-04-03 DIAGNOSIS — Z9181 History of falling: Secondary | ICD-10-CM | POA: Diagnosis not present

## 2024-04-03 DIAGNOSIS — M65862 Other synovitis and tenosynovitis, left lower leg: Secondary | ICD-10-CM | POA: Diagnosis not present

## 2024-04-03 DIAGNOSIS — E119 Type 2 diabetes mellitus without complications: Secondary | ICD-10-CM | POA: Diagnosis not present

## 2024-04-03 DIAGNOSIS — Z1152 Encounter for screening for COVID-19: Secondary | ICD-10-CM | POA: Diagnosis not present

## 2024-04-03 DIAGNOSIS — Z7409 Other reduced mobility: Secondary | ICD-10-CM | POA: Diagnosis not present

## 2024-04-03 DIAGNOSIS — Z96651 Presence of right artificial knee joint: Secondary | ICD-10-CM | POA: Diagnosis not present

## 2024-04-03 DIAGNOSIS — G8929 Other chronic pain: Secondary | ICD-10-CM | POA: Diagnosis not present

## 2024-04-03 DIAGNOSIS — R269 Unspecified abnormalities of gait and mobility: Secondary | ICD-10-CM | POA: Diagnosis not present

## 2024-04-05 DIAGNOSIS — Z471 Aftercare following joint replacement surgery: Secondary | ICD-10-CM | POA: Diagnosis not present

## 2024-04-05 DIAGNOSIS — Z96652 Presence of left artificial knee joint: Secondary | ICD-10-CM | POA: Diagnosis not present

## 2024-04-07 DIAGNOSIS — Z471 Aftercare following joint replacement surgery: Secondary | ICD-10-CM | POA: Diagnosis not present

## 2024-04-07 DIAGNOSIS — Z96652 Presence of left artificial knee joint: Secondary | ICD-10-CM | POA: Diagnosis not present

## 2024-04-12 DIAGNOSIS — Z96652 Presence of left artificial knee joint: Secondary | ICD-10-CM | POA: Diagnosis not present

## 2024-04-12 DIAGNOSIS — Z471 Aftercare following joint replacement surgery: Secondary | ICD-10-CM | POA: Diagnosis not present

## 2024-04-15 DIAGNOSIS — Z471 Aftercare following joint replacement surgery: Secondary | ICD-10-CM | POA: Diagnosis not present

## 2024-04-15 DIAGNOSIS — Z96652 Presence of left artificial knee joint: Secondary | ICD-10-CM | POA: Diagnosis not present

## 2024-04-16 DIAGNOSIS — M170 Bilateral primary osteoarthritis of knee: Secondary | ICD-10-CM | POA: Diagnosis not present

## 2024-04-19 DIAGNOSIS — Z471 Aftercare following joint replacement surgery: Secondary | ICD-10-CM | POA: Diagnosis not present

## 2024-04-19 DIAGNOSIS — Z96652 Presence of left artificial knee joint: Secondary | ICD-10-CM | POA: Diagnosis not present

## 2024-04-22 DIAGNOSIS — Z471 Aftercare following joint replacement surgery: Secondary | ICD-10-CM | POA: Diagnosis not present

## 2024-04-22 DIAGNOSIS — Z96652 Presence of left artificial knee joint: Secondary | ICD-10-CM | POA: Diagnosis not present

## 2024-04-26 ENCOUNTER — Ambulatory Visit: Admitting: Podiatry

## 2024-04-26 DIAGNOSIS — Z96652 Presence of left artificial knee joint: Secondary | ICD-10-CM | POA: Diagnosis not present

## 2024-04-26 DIAGNOSIS — Z471 Aftercare following joint replacement surgery: Secondary | ICD-10-CM | POA: Diagnosis not present

## 2024-04-27 ENCOUNTER — Telehealth: Payer: Self-pay | Admitting: Podiatry

## 2024-04-27 ENCOUNTER — Ambulatory Visit (INDEPENDENT_AMBULATORY_CARE_PROVIDER_SITE_OTHER): Admitting: Podiatry

## 2024-04-27 DIAGNOSIS — M79674 Pain in right toe(s): Secondary | ICD-10-CM

## 2024-04-27 DIAGNOSIS — E08621 Diabetes mellitus due to underlying condition with foot ulcer: Secondary | ICD-10-CM

## 2024-04-27 DIAGNOSIS — B351 Tinea unguium: Secondary | ICD-10-CM | POA: Diagnosis not present

## 2024-04-27 DIAGNOSIS — M79675 Pain in left toe(s): Secondary | ICD-10-CM | POA: Diagnosis not present

## 2024-04-27 DIAGNOSIS — L97512 Non-pressure chronic ulcer of other part of right foot with fat layer exposed: Secondary | ICD-10-CM

## 2024-04-27 DIAGNOSIS — E1142 Type 2 diabetes mellitus with diabetic polyneuropathy: Secondary | ICD-10-CM

## 2024-04-27 DIAGNOSIS — L84 Corns and callosities: Secondary | ICD-10-CM

## 2024-04-27 NOTE — Progress Notes (Signed)
  Subjective:  Patient ID: Carolynne Citron, male    DOB: 09-07-1956,   MRN: [id number]  Chief Complaint  Patient presents with   Nail Problem    68 y.o. male presents for concern of thickened elongated and painful nails that are difficult to trim. Also has calluses and concerned the one has been worsening and been more painful. Requesting to have them trimmed today. Relates burning and tingling in their feet. Patient is diabetic and last A1c was  Lab Results  Component Value Date   HGBA1C 6.9 11/24/2023   .   He was lost to follow-up after tenotomy procedure but doing well with this. Does have new wound on his right great toe that popped up two weeks ago. He has been keeping it covered.   PCP:  Leesa Pulling, MD    . Denies any other pedal complaints. Denies n/v/f/c.   Past Medical History:  Diagnosis Date   Diabetes mellitus without complication (HCC)    Hyperlipidemia    Hypertension     Objective:  Physical Exam: Vascular: DP/PT pulses 2/4 bilateral. CFT <3 seconds. Absent hair growth on digits. Edema noted to bilateral lower extremities. Xerosis noted bilaterally.  Skin. No lacerations or abrasions bilateral feet. Nails 1-5 bilateral  are thickened discolored and elongated with subungual debris.  Hyperkeratotic lesion noted to plantar right heel. Distal left third digit hyperkeratosis resolved  Plantar right hallux ulceration noted with granular base and measurements below. No erythema edema or purulence noted. Does probe deep but not to bone.  Musculoskeletal: MMT 5/5 bilateral lower extremities in DF, PF, Inversion and Eversion. Deceased ROM in DF of ankle joint.  Neurological: Sensation intact to light touch. Protective sensation diminished bilateral.    Assessment:   1. Pain due to onychomycosis of toenails of both feet   2. Diabetic peripheral neuropathy associated with type 2 diabetes mellitus (HCC)   3. Pre-ulcerative corn or callous   4. Diabetic ulcer of toe of  right foot associated with diabetes mellitus due to underlying condition, with fat layer exposed (HCC)       Plan:  Patient was evaluated and treated and all questions answered. -Discussed and educated patient on diabetic foot care, especially with  regards to the vascular, neurological and musculoskeletal systems.  -Stressed the importance of good glycemic control and the detriment of not  controlling glucose levels in relation to the foot. -Discussed supportive shoes at all times and checking feet regularly.  -Mechanically debrided all nails 1-5 bilateral using sterile nail nipper and filed with dremel without incident  Ulcer right plantar hallux  -Debridement as below. -Dressed with betadine , DSD. -Off-loading with surgical shoe. Dispensed  -No abx indicated.  -Discussed glucose control and proper protein-rich diet.  -Discussed if any worsening redness, pain, fever or chills to call or may need to report to the emergency room. Patient expressed understanding.   Procedure: Excisional Debridement of Wound Rationale: Removal of non-viable soft tissue from the wound to promote healing.  Anesthesia: none Pre-Debridement Wound Measurements: Overlying callus  Post-Debridement Wound Measurements: 0.5 cm x 0.6 cm x 0.2 cm  Type of Debridement: Sharp Excisional Tissue Removed: Non-viable soft tissue Depth of Debridement: subcutaneous tissue. Technique: Sharp excisional debridement to bleeding, viable wound base.  Dressing: Dry, sterile, compression dressing. Disposition: Patient tolerated procedure well. Patient to return in 2 week for follow-up.  Return in about 1 week (around 05/04/2024) for wound check.   Jennefer Moats, DPM

## 2024-04-27 NOTE — Telephone Encounter (Signed)
 Due to you being completely scheduled next week I placed this patient with Dr.McCaughan on next Tuesday for Wound Care.Aaron Aas

## 2024-04-28 DIAGNOSIS — E782 Mixed hyperlipidemia: Secondary | ICD-10-CM | POA: Diagnosis not present

## 2024-04-28 DIAGNOSIS — E1142 Type 2 diabetes mellitus with diabetic polyneuropathy: Secondary | ICD-10-CM | POA: Diagnosis not present

## 2024-04-28 DIAGNOSIS — M109 Gout, unspecified: Secondary | ICD-10-CM | POA: Diagnosis not present

## 2024-04-29 DIAGNOSIS — Z471 Aftercare following joint replacement surgery: Secondary | ICD-10-CM | POA: Diagnosis not present

## 2024-04-29 DIAGNOSIS — Z96652 Presence of left artificial knee joint: Secondary | ICD-10-CM | POA: Diagnosis not present

## 2024-05-03 DIAGNOSIS — Z0100 Encounter for examination of eyes and vision without abnormal findings: Secondary | ICD-10-CM | POA: Diagnosis not present

## 2024-05-03 DIAGNOSIS — E119 Type 2 diabetes mellitus without complications: Secondary | ICD-10-CM | POA: Diagnosis not present

## 2024-05-04 ENCOUNTER — Ambulatory Visit: Admitting: Podiatry

## 2024-05-04 DIAGNOSIS — Z471 Aftercare following joint replacement surgery: Secondary | ICD-10-CM | POA: Diagnosis not present

## 2024-05-04 DIAGNOSIS — Z96652 Presence of left artificial knee joint: Secondary | ICD-10-CM | POA: Diagnosis not present

## 2024-05-04 DIAGNOSIS — E11621 Type 2 diabetes mellitus with foot ulcer: Secondary | ICD-10-CM | POA: Diagnosis not present

## 2024-05-04 DIAGNOSIS — L97511 Non-pressure chronic ulcer of other part of right foot limited to breakdown of skin: Secondary | ICD-10-CM | POA: Diagnosis not present

## 2024-05-04 NOTE — Progress Notes (Signed)
 HPI: 68 y.o. male presenting today for f/u right hallux ulcer.  He's been applying iodine  solution and wrapping the toe with gauze and tape.  He has his surgical shoe on today.  States his blood sugar has been under good control.    Past Medical History:  Diagnosis Date   Diabetes mellitus without complication (HCC)    Hyperlipidemia    Hypertension     Past Surgical History:  Procedure Laterality Date   AMPUTATION Left 09/08/2017   Procedure: PARTIAL TOE AMPUTATION LEFT HALLUX;  Surgeon: Barbra Ley, DPM;  Location: [facility];  Service: Podiatry;  Laterality: Left;   ANKLE SURGERY Bilateral    BIOPSY  04/03/2023   Procedure: BIOPSY;  Surgeon: Vinetta Greening, DO;  [facility]: [facility];  Service: Endoscopy;;   COLONOSCOPY WITH PROPOFOL  N/A 04/03/2023   Procedure: COLONOSCOPY WITH PROPOFOL ;  Surgeon: Vinetta Greening, DO;  [facility];  Service: Endoscopy;  Laterality: N/A;  730am, asa 3   ESOPHAGOGASTRODUODENOSCOPY (EGD) WITH PROPOFOL  N/A 04/03/2023   Procedure: ESOPHAGOGASTRODUODENOSCOPY (EGD) WITH PROPOFOL ;  Surgeon: Vinetta Greening, DO;  [facility];  Service: Endoscopy;  Laterality: N/A;   LUMBAR LAMINECTOMY/DECOMPRESSION MICRODISCECTOMY Left 01/16/2024   Procedure: Microdiscectomy - LUMBAR FIVE-SACRAL ONE-LEFT;  Surgeon: Audie Bleacher, MD;  Location: [facility];  Service: Neurosurgery;  Laterality: Left;  C3   POLYPECTOMY  04/03/2023   Procedure: POLYPECTOMY;  Surgeon: Vinetta Greening, DO;  [facility];  Service: Endoscopy;;   right leg surgery       Allergies  Allergen Reactions   Codeine Nausea And Vomiting   Fish Allergy     Flares gout   Aspirin  Other (See Comments)    Abdominal Pain   Ibuprofen Other (See Comments)    Abdominal Pain, tolerates taking with food     PHYSICAL EXAM: General: The patient is alert and oriented x3 in no acute distress.  Dermatology: Skin is warm, dry and supple bilateral lower extremities.  Interspaces are clear of maceration and debris.      Wound 1:  Location:  plantar-distal right hallux        Depth:  partial thickness to dermis        Wound Border:  minimal hyperkeratosis and maceration        Wound Base:  granular        Drainage: none       Odor?:  none        Surrounding Tissue:  mild edema, no erythema        Infected?:  no        Necrosis?:  none        Pain?:  no        Tunneling:  none       Dimensions (cm):  0.2cm x 0.3cm x 0.1cm  Vascular: Pedal pulses are trace palpable      Latest Ref Rng & Units 11/24/2023    1:43 PM 05/12/2023    2:59 PM  Hemoglobin A1C  Hemoglobin-A1c 0.0 - 7.0 % 6.9  6.4    ASSESSMENT / PLAN OF CARE: 1. Type 2 diabetes mellitus with foot ulcer (CODE) (HCC)   2. Skin ulcer of right great toe, limited to breakdown of skin (HCC)    The ulceration was sharply debrided of hyperkeratotic and devitalized soft tissue with sterile #312 blade to the level of dermis .  Hemostasis obtained.  Antibiotic ointment and DSD applied.  Reviewed off-loading with patient.  Continue with surgical shoe at all times WB.  Reviewed daily dressing changes with patient.  Continue with iodine  solution and gauze dressing.  After he showers, he can sit with the foot on a recliner and let it air out some before rewrapping it.    Discussed risks / concerns regarding ulcer with patient and possible sequelae if left untreated.  Stressed importance of infection prevention at home. Short-term goals are: prevent infection, off-load ulcer, heal ulcer Long-term goals are:  prevent recurrence, prevent amputation.   Return in about 2 weeks (around 05/18/2024) for f/u ulcer w/ Dr. Alvah Auerbach (original doctor).   Joe Murders, DPM, FACFAS [facility]     [address] [place], [place] [address]                Office [phone]  Fax [phone]

## 2024-05-06 DIAGNOSIS — Z96652 Presence of left artificial knee joint: Secondary | ICD-10-CM | POA: Diagnosis not present

## 2024-05-06 DIAGNOSIS — Z471 Aftercare following joint replacement surgery: Secondary | ICD-10-CM | POA: Diagnosis not present

## 2024-05-11 DIAGNOSIS — Z471 Aftercare following joint replacement surgery: Secondary | ICD-10-CM | POA: Diagnosis not present

## 2024-05-11 DIAGNOSIS — Z96652 Presence of left artificial knee joint: Secondary | ICD-10-CM | POA: Diagnosis not present

## 2024-05-13 DIAGNOSIS — Z471 Aftercare following joint replacement surgery: Secondary | ICD-10-CM | POA: Diagnosis not present

## 2024-05-13 DIAGNOSIS — Z96652 Presence of left artificial knee joint: Secondary | ICD-10-CM | POA: Diagnosis not present

## 2024-05-18 DIAGNOSIS — Z471 Aftercare following joint replacement surgery: Secondary | ICD-10-CM | POA: Diagnosis not present

## 2024-05-18 DIAGNOSIS — Z96652 Presence of left artificial knee joint: Secondary | ICD-10-CM | POA: Diagnosis not present

## 2024-05-25 ENCOUNTER — Encounter: Payer: Self-pay | Admitting: Podiatry

## 2024-05-25 ENCOUNTER — Ambulatory Visit (INDEPENDENT_AMBULATORY_CARE_PROVIDER_SITE_OTHER): Admitting: Podiatry

## 2024-05-25 DIAGNOSIS — E08621 Diabetes mellitus due to underlying condition with foot ulcer: Secondary | ICD-10-CM

## 2024-05-25 DIAGNOSIS — L97512 Non-pressure chronic ulcer of other part of right foot with fat layer exposed: Secondary | ICD-10-CM

## 2024-05-25 NOTE — Progress Notes (Signed)
  Subjective:  Patient ID: Calvin Shields, male    DOB: 02/09/1956,   MRN: [id number]  Chief Complaint  Patient presents with   Diabetic Ulcer    "It's doing good."    68 y.o. male presents for follow-up of right great toe wound. Relates doing well and has been dressing as instructed but still getting drainage and opened.    PCP:  Leesa Pulling, MD    . Denies any other pedal complaints. Denies n/v/f/c.   Past Medical History:  Diagnosis Date   Diabetes mellitus without complication (HCC)    Hyperlipidemia    Hypertension     Objective:  Physical Exam: Vascular: DP/PT pulses 2/4 bilateral. CFT <3 seconds. Absent hair growth on digits. Edema noted to bilateral lower extremities. Xerosis noted bilaterally.  Skin. No lacerations or abrasions bilateral feet. Nails 1-5 bilateral  are thickened discolored and elongated with subungual debris.  Hyperkeratotic lesion noted to plantar right heel. Distal left third digit hyperkeratosis resolved  Plantar right hallux ulceration noted with granular base and measurements below. No erythema edema or purulence noted. Does probe deep but not to bone.  Musculoskeletal: MMT 5/5 bilateral lower extremities in DF, PF, Inversion and Eversion. Deceased ROM in DF of ankle joint.  Neurological: Sensation intact to light touch. Protective sensation diminished bilateral.    Assessment:   1. Diabetic ulcer of toe of right foot associated with diabetes mellitus due to underlying condition, with fat layer exposed (HCC)        Plan:  Patient was evaluated and treated and all questions answered. Ulcer right plantar hallux  -Debridement as below. -Dressed with betadine , DSD. -Has not been wearing surgical shoe will try donut padding.  -No abx indicated.  -Discussed glucose control and proper protein-rich diet.  -Discussed if any worsening redness, pain, fever or chills to call or may need to report to the emergency room. Patient expressed  understanding.   Procedure: Excisional Debridement of Wound Rationale: Removal of non-viable soft tissue from the wound to promote healing.  Anesthesia: none Pre-Debridement Wound Measurements: Overlying callus  Post-Debridement Wound Measurements: 0.5 cm x 0.4 cm x 0.2 cm  Type of Debridement: Sharp Excisional Tissue Removed: Non-viable soft tissue Depth of Debridement: subcutaneous tissue. Technique: Sharp excisional debridement to bleeding, viable wound base.  Dressing: Dry, sterile, compression dressing. Disposition: Patient tolerated procedure well. Patient to return in 2 week for follow-up.  No follow-ups on file.   Jennefer Moats, DPM

## 2024-05-28 DIAGNOSIS — E782 Mixed hyperlipidemia: Secondary | ICD-10-CM | POA: Diagnosis not present

## 2024-05-28 DIAGNOSIS — E1142 Type 2 diabetes mellitus with diabetic polyneuropathy: Secondary | ICD-10-CM | POA: Diagnosis not present

## 2024-05-28 DIAGNOSIS — M109 Gout, unspecified: Secondary | ICD-10-CM | POA: Diagnosis not present

## 2024-05-31 ENCOUNTER — Ambulatory Visit: Payer: Medicare HMO | Admitting: "Endocrinology

## 2024-05-31 ENCOUNTER — Encounter: Payer: Self-pay | Admitting: "Endocrinology

## 2024-05-31 VITALS — BP 112/62 | HR 80 | Ht 69.0 in | Wt 235.2 lb

## 2024-05-31 DIAGNOSIS — Z6834 Body mass index (BMI) 34.0-34.9, adult: Secondary | ICD-10-CM

## 2024-05-31 DIAGNOSIS — I10 Essential (primary) hypertension: Secondary | ICD-10-CM | POA: Diagnosis not present

## 2024-05-31 DIAGNOSIS — Z7985 Long-term (current) use of injectable non-insulin antidiabetic drugs: Secondary | ICD-10-CM

## 2024-05-31 DIAGNOSIS — E782 Mixed hyperlipidemia: Secondary | ICD-10-CM | POA: Diagnosis not present

## 2024-05-31 DIAGNOSIS — E66812 Obesity, class 2: Secondary | ICD-10-CM

## 2024-05-31 DIAGNOSIS — E119 Type 2 diabetes mellitus without complications: Secondary | ICD-10-CM

## 2024-05-31 DIAGNOSIS — E1165 Type 2 diabetes mellitus with hyperglycemia: Secondary | ICD-10-CM

## 2024-05-31 NOTE — Progress Notes (Signed)
 05/31/2024   Endocrinology follow-up note   Subjective:    Patient ID: Calvin Shields, male    DOB: [birth year], PCP Calvin Pulling, MD   Past Medical History:  Diagnosis Date   Diabetes mellitus without complication [facility])    Hyperlipidemia    Hypertension    Past Surgical History:  Procedure Laterality Date   AMPUTATION Left 09/08/2017   Procedure: PARTIAL TOE AMPUTATION LEFT HALLUX;  Surgeon: Barbra Ley, DPM;  Location: [facility];  Service: Podiatry;  Laterality: Left;   ANKLE SURGERY Bilateral    BIOPSY  04/03/2023   Procedure: BIOPSY;  Surgeon: Vinetta Greening, DO;  [facility];  Service: Endoscopy;;   COLONOSCOPY WITH PROPOFOL  N/A 04/03/2023   Procedure: COLONOSCOPY WITH PROPOFOL ;  Surgeon: Vinetta Greening, DO;  [facility];  Service: Endoscopy;  Laterality: N/A;  730am, asa 3   ESOPHAGOGASTRODUODENOSCOPY (EGD) WITH PROPOFOL  N/A 04/03/2023   Procedure: ESOPHAGOGASTRODUODENOSCOPY (EGD) WITH PROPOFOL ;  Surgeon: Vinetta Greening, DO;  [facility];  Service: Endoscopy;  Laterality: N/A;   LUMBAR LAMINECTOMY/DECOMPRESSION MICRODISCECTOMY Left 01/16/2024   Procedure: Microdiscectomy - LUMBAR FIVE-SACRAL ONE-LEFT;  Surgeon: Audie Bleacher, MD;  Location: [facility];  Service: Neurosurgery;  Laterality: Left;  C3   POLYPECTOMY  04/03/2023   Procedure: POLYPECTOMY;  Surgeon: Vinetta Greening, DO;  [facility];  Service: Endoscopy;;   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   right leg surgery       Family History  Problem Relation Age of Onset   Diabetes Mother    Cancer - Colon Neg Hx    Colon polyps Neg Hx     Social History   Socioeconomic History   Marital status: Married    Spouse name: Not on file   Number of children: Not on file   Years of education: Not on file   Highest education level: Not on file  Occupational History   Not on file  Tobacco Use   Smoking status: Never    Passive exposure: Never    Smokeless tobacco: Never  Vaping Use   Vaping status: Never Used  Substance and Sexual Activity   Alcohol use: No   Drug use: No   Sexual activity: Not on file  Other Topics Concern   Not on file  Social History Narrative   Not on file   Social Drivers of Health   Financial Resource Strain: Low Risk  (03/31/2024)   Received from [facility]   Overall Financial Resource Strain (CARDIA)    Difficulty of Paying Living Expenses: Not hard at all  Food Insecurity: No Food Insecurity (03/31/2024)   Received from [facility]   Hunger Vital Sign    Worried About Running Out of Food in the Last Year: Never true    Ran Out of Food in the Last Year: Never true  Transportation Needs: No Transportation Needs (03/31/2024)   Received from [facility]   PRAPARE - Transportation    Lack of Transportation (Medical): No    Lack of Transportation (Non-Medical): No  Physical Activity: Insufficiently Active (03/31/2024)   Received from [facility]   Exercise Vital Sign    Days of Exercise per Week: 4 days    Minutes of Exercise per Session: 20 min  Stress: No Stress Concern Present (03/31/2024)   Received from [facility] Occupational [facility] - Occupational Stress Questionnaire    Feeling of Stress :  Not at all  Social Connections: Socially Integrated (03/31/2024)   Received from [facility]   Social Connection and Isolation Panel [NHANES]    Frequency of Communication with Friends and Family: More than three times a week    Frequency of Social Gatherings with Friends and Family: More than three times a week    Attends Religious Services: More than 4 times per year    Active Member of [employer] or Organizations: Yes    Attends Banker Meetings: More than 4 times per year    Marital Status: Married   Outpatient Encounter Medications as of 05/31/2024  Medication Sig   acetaminophen  (TYLENOL ) 500 MG tablet Take 1,000 mg by mouth every 6 (six) hours as  needed for moderate pain (pain score 4-6).   allopurinol  (ZYLOPRIM ) 100 MG tablet Take 300 mg by mouth daily.   atorvastatin  (LIPITOR) 40 MG tablet Take 40 mg by mouth at bedtime.   colchicine 0.6 MG tablet Take 0.6 mg by mouth daily as needed (gout).   diclofenac Sodium (VOLTAREN) 1 % GEL Apply 1 Application topically 4 (four) times daily as needed (pain).   Dulaglutide  (TRULICITY ) 0.75 MG/0.5ML SOAJ Inject 0.75 mg into the skin every Tuesday.   ibuprofen (ADVIL) 200 MG tablet Take 400 mg by mouth every 6 (six) hours as needed for moderate pain (pain score 4-6).   lisinopril -hydrochlorothiazide  (PRINZIDE ,ZESTORETIC ) 20-25 MG per tablet Take 1 tablet by mouth daily.   methocarbamol  (ROBAXIN ) 500 MG tablet Take 500 mg by mouth 2 (two) times daily as needed for muscle spasms.   Multiple Vitamin (MULTIVITAMIN WITH MINERALS) TABS tablet Take 1 tablet by mouth daily.   pantoprazole  (PROTONIX ) 40 MG tablet take 1 tablet once daily.   tiZANidine  (ZANAFLEX ) 4 MG tablet Take 1 tablet (4 mg total) by mouth every 6 (six) hours as needed for muscle spasms.   traMADol (ULTRAM) 50 MG tablet Take 50 mg by mouth every 6 (six) hours as needed for moderate pain (pain score 4-6).   No facility-administered encounter medications on file as of 05/31/2024.   ALLERGIES: Allergies  Allergen Reactions   Codeine Nausea And Vomiting   Fish Allergy     Flares gout   Aspirin  Other (See Comments)    Abdominal Pain   Ibuprofen Other (See Comments)    Abdominal Pain, tolerates taking with food    VACCINATION STATUS: Immunization History  Administered Date(s) Administered   Influenza,inj,Quad PF,6+ Mos 10/13/2017, 10/06/2019    Diabetes He presents for his follow-up diabetic visit. He has type 2 diabetes mellitus. His disease course has been improving. There are no hypoglycemic associated symptoms. Pertinent negatives for hypoglycemia include no confusion, headaches, pallor or seizures. Pertinent negatives for  diabetes include no chest pain, no fatigue, no polydipsia, no polyphagia, no polyuria and no weakness. There are no hypoglycemic complications. Symptoms are improving. Diabetic complications include peripheral neuropathy. Risk factors for coronary artery disease include diabetes mellitus, dyslipidemia, hypertension, male sex, obesity and sedentary lifestyle. He is compliant with treatment most of the time. His weight is decreasing steadily. He is following a generally unhealthy diet. When asked about meal planning, he reported none. He has had a previous visit with a dietitian. He never participates in exercise. His home blood glucose trend is decreasing steadily. (He presents with controlled glycemic profile with point-of-care A1c of 6.4%.  He did not document any hypoglycemia.   ) An ACE inhibitor/angiotensin II receptor blocker is being taken. Eye exam is current.  Hyperlipidemia  This is a chronic problem. The current episode started more than 1 year ago. The problem is uncontrolled. Exacerbating diseases include diabetes and obesity. Pertinent negatives include no chest pain, myalgias or shortness of breath. Current antihyperlipidemic treatment includes statins. Compliance problems include adherence to diet.  Risk factors for coronary artery disease include diabetes mellitus, dyslipidemia, hypertension, male sex, obesity and a sedentary lifestyle.  Hypertension This is a chronic problem. The current episode started more than 1 year ago. Pertinent negatives include no chest pain, headaches, neck pain, palpitations or shortness of breath. Risk factors for coronary artery disease include dyslipidemia, diabetes mellitus, obesity, male gender and sedentary lifestyle. Past treatments include ACE inhibitors.     Review of systems Limited as above.   Objective:    BP 112/62   Pulse 80   Ht 5\' 9"  (1.753 m)   Wt 235 lb 3.2 oz (106.7 kg)   BMI 34.73 kg/m   Wt Readings from Last 3 Encounters:  05/31/24  235 lb 3.2 oz (106.7 kg)  01/16/24 242 lb (109.8 kg)  11/24/23 245 lb 3.2 oz (111.2 kg)        No results found for this or any previous visit (from the past 2160 hours).  Lipid Panel     Component Value Date/Time   CHOL 152 11/17/2023 1318   TRIG 178 (H) 11/17/2023 1318   HDL 43 11/17/2023 1318   CHOLHDL 3.5 11/17/2023 1318   CHOLHDL 4.1 10/06/2018 1310   VLDL 29 04/15/2016 0836   LDLCALC 79 11/17/2023 1318   LDLCALC 114 (H) 10/06/2018 1310    Assessment & Plan:   1. Diabetes mellitus without complication (HCC)  - His diabetes is  complicated by peripheral neuropathy , peripheral arterial disease, Charcot's deformities and patient remains at a high risk for more acute and chronic complications of diabetes which include CAD, CVA, CKD, retinopathy, and neuropathy. These are all discussed in detail with the patient.  He presents with controlled glycemic profile with point-of-care A1c of 6.4%.  He did not document any hypoglycemia.    - Recent labs reviewed with him, showing normal renal function.  - I have re-counseled the patient on diet management and  and weight loss by adopting a carbohydrate restricted / protein rich  Diet.   He presents with fluctuating body weight. - he acknowledges that there is a room for improvement in his food and drink choices. - Suggestion is made for him to avoid simple carbohydrates  from his diet including Cakes, Sweet Desserts, Ice Cream, Soda (diet and regular), Sweet Tea, Candies, Chips, Cookies, Store Bought Juices, Alcohol in Excess of  1-2 drinks a day, Artificial Sweeteners,  Coffee Creamer, and "Sugar-free" Products, Lemonade. This will help patient to have more stable blood glucose profile and potentially avoid unintended weight gain.  - Patient is advised to stick to a routine mealtimes to eat 3 meals  a day and avoid unnecessary snacks ( to snack only to correct hypoglycemia).  - I have approached patient with the following  individualized plan to manage diabetes and patient agrees.  -He is advised to continue Trulicity  0.75 mg subcutaneously weekly.  He does not want to advance this medication for now.    Side effects and precautions discussed with him.   He is not on  metformin  for now.  He will not need insulin  nor glipizide  at this time.    - Patient specific target  for A1c; LDL, HDL, Triglycerides, and  Waist Circumference were discussed  in detail.  2) BP/HTN:  - His blood pressure is controlled to target.  He is advised to take half tablet of lisinopril -HCTZ 20-25 mg for the next 7 days and may resume the full dose afterwards.   3) Lipids/HPL: His recent lipid panel revealed controlled LDL at 79.  He is advised to continue pravastatin  40 mg p.o. nightly.   Side effects and precautions discussed with him.   4)  Weight/Diet: His BMI is 34.73 -a candidate for modest weight loss.  CDE consult in progress, exercise, and carbohydrates information provided.  5) Chronic Care/Health Maintenance:  -Patient is on ACEI/ARB and Statin medications and encouraged to continue to follow up with Ophthalmology, Podiatrist at least yearly or according to recommendations, and advised to  stay away from smoking. I have recommended yearly flu vaccine and pneumonia vaccination at least every 5 years; moderate intensity exercise for up to 150 minutes weekly; and  sleep for at least 7 hours a day.  History screening ABI was negative for PAD in March 2022.  His study will be repeated in March 2027, or sooner if needed.   - I advised patient to maintain close follow up with Calvin Pulling, MD for primary care needs.   I spent  26  minutes in the care of the patient today including review of labs from CMP, Lipids, Thyroid  Function, Hematology (current and previous including abstractions from other facilities); face-to-face time discussing  his blood glucose readings/logs, discussing hypoglycemia and hyperglycemia episodes and  symptoms, medications doses, his options of short and long term treatment based on the latest standards of care / guidelines;  discussion about incorporating lifestyle medicine;  and documenting the encounter. Risk reduction counseling performed per USPSTF guidelines to reduce  obesity and cardiovascular risk factors.     Please refer to Patient Instructions for Blood Glucose Monitoring and Insulin /Medications Dosing Guide"  in media tab for additional information. Please  also refer to " Patient Self Inventory" in the Media  tab for reviewed elements of pertinent patient history.  [facility] participated in the discussions, expressed understanding, and voiced agreement with the above plans.  All questions were answered to his satisfaction. he is encouraged to contact clinic should he have any questions or concerns prior to his return visit.   Follow up plan: -Return in about 6 months (around 11/30/2024) for Fasting Labs  in AM B4 8, A1c -NV.  Calvin Orf, MD Phone: [phone]  Fax: [phone]  -  This note was partially dictated with voice recognition software. Similar sounding words can be transcribed inadequately or may not  be corrected upon review.  05/31/2024, 6:59 PM

## 2024-05-31 NOTE — Patient Instructions (Signed)

## 2024-06-07 DIAGNOSIS — N1831 Chronic kidney disease, stage 3a: Secondary | ICD-10-CM | POA: Diagnosis not present

## 2024-06-07 DIAGNOSIS — E039 Hypothyroidism, unspecified: Secondary | ICD-10-CM | POA: Diagnosis not present

## 2024-06-07 DIAGNOSIS — E1142 Type 2 diabetes mellitus with diabetic polyneuropathy: Secondary | ICD-10-CM | POA: Diagnosis not present

## 2024-06-07 DIAGNOSIS — E7849 Other hyperlipidemia: Secondary | ICD-10-CM | POA: Diagnosis not present

## 2024-06-07 DIAGNOSIS — E782 Mixed hyperlipidemia: Secondary | ICD-10-CM | POA: Diagnosis not present

## 2024-06-07 DIAGNOSIS — I10 Essential (primary) hypertension: Secondary | ICD-10-CM | POA: Diagnosis not present

## 2024-06-14 ENCOUNTER — Ambulatory Visit (INDEPENDENT_AMBULATORY_CARE_PROVIDER_SITE_OTHER)

## 2024-06-14 ENCOUNTER — Encounter (HOSPITAL_COMMUNITY): Payer: Self-pay

## 2024-06-14 ENCOUNTER — Other Ambulatory Visit: Payer: Self-pay

## 2024-06-14 ENCOUNTER — Encounter: Payer: Self-pay | Admitting: Podiatry

## 2024-06-14 ENCOUNTER — Inpatient Hospital Stay (HOSPITAL_COMMUNITY)
Admission: EM | Admit: 2024-06-14 | Discharge: 2024-06-17 | DRG: 854 | Disposition: A | Discharge status: Home / Self Care | Attending: Internal Medicine | Admitting: Internal Medicine

## 2024-06-14 ENCOUNTER — Ambulatory Visit (INDEPENDENT_AMBULATORY_CARE_PROVIDER_SITE_OTHER): Admitting: Podiatry

## 2024-06-14 ENCOUNTER — Emergency Department (HOSPITAL_COMMUNITY)

## 2024-06-14 DIAGNOSIS — E11628 Type 2 diabetes mellitus with other skin complications: Secondary | ICD-10-CM | POA: Diagnosis present

## 2024-06-14 DIAGNOSIS — E876 Hypokalemia: Secondary | ICD-10-CM | POA: Diagnosis present

## 2024-06-14 DIAGNOSIS — L0390 Cellulitis, unspecified: Secondary | ICD-10-CM | POA: Diagnosis not present

## 2024-06-14 DIAGNOSIS — E1165 Type 2 diabetes mellitus with hyperglycemia: Secondary | ICD-10-CM | POA: Diagnosis present

## 2024-06-14 DIAGNOSIS — E785 Hyperlipidemia, unspecified: Secondary | ICD-10-CM | POA: Diagnosis present

## 2024-06-14 DIAGNOSIS — E11621 Type 2 diabetes mellitus with foot ulcer: Secondary | ICD-10-CM | POA: Diagnosis present

## 2024-06-14 DIAGNOSIS — L98499 Non-pressure chronic ulcer of skin of other sites with unspecified severity: Secondary | ICD-10-CM | POA: Diagnosis not present

## 2024-06-14 DIAGNOSIS — R509 Fever, unspecified: Secondary | ICD-10-CM | POA: Diagnosis not present

## 2024-06-14 DIAGNOSIS — Z89411 Acquired absence of right great toe: Secondary | ICD-10-CM | POA: Diagnosis not present

## 2024-06-14 DIAGNOSIS — Z96652 Presence of left artificial knee joint: Secondary | ICD-10-CM | POA: Diagnosis present

## 2024-06-14 DIAGNOSIS — Z6834 Body mass index (BMI) 34.0-34.9, adult: Secondary | ICD-10-CM | POA: Diagnosis not present

## 2024-06-14 DIAGNOSIS — K219 Gastro-esophageal reflux disease without esophagitis: Secondary | ICD-10-CM | POA: Diagnosis present

## 2024-06-14 DIAGNOSIS — Z885 Allergy status to narcotic agent status: Secondary | ICD-10-CM

## 2024-06-14 DIAGNOSIS — E1169 Type 2 diabetes mellitus with other specified complication: Secondary | ICD-10-CM | POA: Diagnosis present

## 2024-06-14 DIAGNOSIS — E08621 Diabetes mellitus due to underlying condition with foot ulcer: Secondary | ICD-10-CM | POA: Diagnosis not present

## 2024-06-14 DIAGNOSIS — M869 Osteomyelitis, unspecified: Secondary | ICD-10-CM

## 2024-06-14 DIAGNOSIS — E782 Mixed hyperlipidemia: Secondary | ICD-10-CM | POA: Diagnosis not present

## 2024-06-14 DIAGNOSIS — E1122 Type 2 diabetes mellitus with diabetic chronic kidney disease: Secondary | ICD-10-CM | POA: Diagnosis present

## 2024-06-14 DIAGNOSIS — R600 Localized edema: Secondary | ICD-10-CM | POA: Diagnosis not present

## 2024-06-14 DIAGNOSIS — I10 Essential (primary) hypertension: Secondary | ICD-10-CM | POA: Diagnosis present

## 2024-06-14 DIAGNOSIS — L97519 Non-pressure chronic ulcer of other part of right foot with unspecified severity: Secondary | ICD-10-CM | POA: Diagnosis present

## 2024-06-14 DIAGNOSIS — L97514 Non-pressure chronic ulcer of other part of right foot with necrosis of bone: Secondary | ICD-10-CM

## 2024-06-14 DIAGNOSIS — L02611 Cutaneous abscess of right foot: Secondary | ICD-10-CM | POA: Diagnosis not present

## 2024-06-14 DIAGNOSIS — Z886 Allergy status to analgesic agent status: Secondary | ICD-10-CM

## 2024-06-14 DIAGNOSIS — A419 Sepsis, unspecified organism: Principal | ICD-10-CM | POA: Diagnosis present

## 2024-06-14 DIAGNOSIS — M109 Gout, unspecified: Secondary | ICD-10-CM | POA: Diagnosis not present

## 2024-06-14 DIAGNOSIS — Z79899 Other long term (current) drug therapy: Secondary | ICD-10-CM | POA: Diagnosis not present

## 2024-06-14 DIAGNOSIS — Z833 Family history of diabetes mellitus: Secondary | ICD-10-CM

## 2024-06-14 DIAGNOSIS — I129 Hypertensive chronic kidney disease with stage 1 through stage 4 chronic kidney disease, or unspecified chronic kidney disease: Secondary | ICD-10-CM | POA: Diagnosis present

## 2024-06-14 DIAGNOSIS — M79674 Pain in right toe(s): Secondary | ICD-10-CM | POA: Diagnosis not present

## 2024-06-14 DIAGNOSIS — E119 Type 2 diabetes mellitus without complications: Secondary | ICD-10-CM

## 2024-06-14 DIAGNOSIS — E1140 Type 2 diabetes mellitus with diabetic neuropathy, unspecified: Secondary | ICD-10-CM | POA: Diagnosis present

## 2024-06-14 DIAGNOSIS — L089 Local infection of the skin and subcutaneous tissue, unspecified: Principal | ICD-10-CM | POA: Diagnosis present

## 2024-06-14 DIAGNOSIS — Z91018 Allergy to other foods: Secondary | ICD-10-CM | POA: Diagnosis not present

## 2024-06-14 DIAGNOSIS — Z7985 Long-term (current) use of injectable non-insulin antidiabetic drugs: Secondary | ICD-10-CM

## 2024-06-14 DIAGNOSIS — L03031 Cellulitis of right toe: Secondary | ICD-10-CM | POA: Diagnosis present

## 2024-06-14 DIAGNOSIS — M868X7 Other osteomyelitis, ankle and foot: Secondary | ICD-10-CM | POA: Diagnosis present

## 2024-06-14 DIAGNOSIS — N1831 Chronic kidney disease, stage 3a: Secondary | ICD-10-CM | POA: Diagnosis not present

## 2024-06-14 DIAGNOSIS — M86171 Other acute osteomyelitis, right ankle and foot: Secondary | ICD-10-CM | POA: Diagnosis not present

## 2024-06-14 DIAGNOSIS — E66811 Obesity, class 1: Secondary | ICD-10-CM | POA: Diagnosis present

## 2024-06-14 LAB — COMPREHENSIVE METABOLIC PANEL WITH GFR
ALT: 25 U/L (ref 0–44)
AST: 23 U/L (ref 15–41)
Albumin: 3.5 g/dL (ref 3.5–5.0)
Alkaline Phosphatase: 119 U/L (ref 38–126)
Anion gap: 15 (ref 5–15)
BUN: 14 mg/dL (ref 8–23)
CO2: 28 mmol/L (ref 22–32)
Calcium: 9.7 mg/dL (ref 8.9–10.3)
Chloride: 93 mmol/L — ABNORMAL LOW (ref 98–111)
Creatinine, Ser: 1.55 mg/dL — ABNORMAL HIGH (ref 0.61–1.24)
GFR, Estimated: 48 mL/min — ABNORMAL LOW (ref >60)
Glucose, Bld: 127 mg/dL — ABNORMAL HIGH (ref 70–99)
Potassium: 3.3 mmol/L — ABNORMAL LOW (ref 3.5–5.1)
Sodium: 136 mmol/L (ref 135–145)
Total Bilirubin: 1.3 mg/dL — ABNORMAL HIGH (ref 0.0–1.2)
Total Protein: 8.1 g/dL (ref 6.5–8.1)

## 2024-06-14 LAB — CBC WITH DIFFERENTIAL/PLATELET
Abs Immature Granulocytes: 0.06 10*3/uL (ref 0.00–0.07)
Basophils Absolute: 0 10*3/uL (ref 0.0–0.1)
Basophils Relative: 0 %
Eosinophils Absolute: 0.2 10*3/uL (ref 0.0–0.5)
Eosinophils Relative: 1 %
HCT: 35.8 % — ABNORMAL LOW (ref 39.0–52.0)
Hemoglobin: 11.6 g/dL — ABNORMAL LOW (ref 13.0–17.0)
Immature Granulocytes: 0 %
Lymphocytes Relative: 12 %
Lymphs Abs: 1.7 10*3/uL (ref 0.7–4.0)
MCH: 28.9 pg (ref 26.0–34.0)
MCHC: 32.4 g/dL (ref 30.0–36.0)
MCV: 89.1 fL (ref 80.0–100.0)
Monocytes Absolute: 1.3 10*3/uL — ABNORMAL HIGH (ref 0.1–1.0)
Monocytes Relative: 9 %
Neutro Abs: 10.6 10*3/uL — ABNORMAL HIGH (ref 1.7–7.7)
Neutrophils Relative %: 78 %
Platelets: 461 10*3/uL — ABNORMAL HIGH (ref 150–400)
RBC: 4.02 MIL/uL — ABNORMAL LOW (ref 4.22–5.81)
RDW: 13.6 % (ref 11.5–15.5)
WBC: 13.8 10*3/uL — ABNORMAL HIGH (ref 4.0–10.5)
nRBC: 0 % (ref 0.0–0.2)

## 2024-06-14 LAB — I-STAT CG4 LACTIC ACID, ED: Lactic Acid, Venous: 1.1 mmol/L (ref 0.5–1.9)

## 2024-06-14 LAB — GLUCOSE, CAPILLARY: Glucose-Capillary: 181 mg/dL — ABNORMAL HIGH (ref 70–99)

## 2024-06-14 MED ORDER — HYDRALAZINE HCL 25 MG PO TABS: 25.0000 mg | ORAL_TABLET | Freq: Four times a day (QID) | ORAL | Status: DC | PRN

## 2024-06-14 MED ORDER — PROCHLORPERAZINE EDISYLATE 10 MG/2ML IJ SOLN
5.0000 mg | Freq: Four times a day (QID) | INTRAMUSCULAR | Status: DC | PRN
  Administered 2024-06-17: 5 mg via INTRAVENOUS
  Filled 2024-06-14: qty 2

## 2024-06-14 MED ORDER — INSULIN ASPART 100 UNIT/ML IJ SOLN
0.0000 [IU] | Freq: Three times a day (TID) | INTRAMUSCULAR | Status: DC
  Administered 2024-06-15 – 2024-06-17 (×3): 1 [IU] via SUBCUTANEOUS

## 2024-06-14 MED ORDER — ACETAMINOPHEN 500 MG PO TABS
1000.0000 mg | ORAL_TABLET | Freq: Once | ORAL | Status: AC
  Administered 2024-06-14: 1000 mg via ORAL
  Filled 2024-06-14: qty 2

## 2024-06-14 MED ORDER — SODIUM CHLORIDE 0.9 % IV SOLN
3.0000 g | Freq: Once | INTRAVENOUS | Status: AC
  Administered 2024-06-14: 3 g via INTRAVENOUS
  Filled 2024-06-14: qty 8

## 2024-06-14 MED ORDER — INSULIN ASPART 100 UNIT/ML IJ SOLN: 0.0000 [IU] | Freq: Every day | INTRAMUSCULAR | Status: DC

## 2024-06-14 MED ORDER — SODIUM CHLORIDE 0.9 % IV SOLN
3.0000 g | Freq: Four times a day (QID) | INTRAVENOUS | Status: DC
  Administered 2024-06-14 – 2024-06-16 (×6): 3 g via INTRAVENOUS
  Filled 2024-06-14 (×8): qty 8

## 2024-06-14 MED ORDER — ENOXAPARIN SODIUM 40 MG/0.4ML IJ SOSY
40.0000 mg | PREFILLED_SYRINGE | INTRAMUSCULAR | Status: DC
  Administered 2024-06-15 – 2024-06-17 (×2): 40 mg via SUBCUTANEOUS
  Filled 2024-06-14 (×2): qty 0.4

## 2024-06-14 MED ORDER — FENTANYL CITRATE PF 50 MCG/ML IJ SOSY: 12.5000 ug | PREFILLED_SYRINGE | INTRAMUSCULAR | Status: DC | PRN

## 2024-06-14 MED ORDER — OXYCODONE HCL 5 MG PO TABS
5.0000 mg | ORAL_TABLET | ORAL | Status: DC | PRN
  Administered 2024-06-15 – 2024-06-16 (×3): 5 mg via ORAL
  Filled 2024-06-14 (×3): qty 1

## 2024-06-14 MED ORDER — ATORVASTATIN CALCIUM 40 MG PO TABS
40.0000 mg | ORAL_TABLET | Freq: Every day | ORAL | Status: DC
  Administered 2024-06-14 – 2024-06-16 (×3): 40 mg via ORAL
  Filled 2024-06-14 (×3): qty 1

## 2024-06-14 MED ORDER — ACETAMINOPHEN 650 MG RE SUPP: 650.0000 mg | Freq: Four times a day (QID) | RECTAL | Status: DC | PRN

## 2024-06-14 MED ORDER — ACETAMINOPHEN 325 MG PO TABS
650.0000 mg | ORAL_TABLET | Freq: Four times a day (QID) | ORAL | Status: DC | PRN
  Administered 2024-06-15: 650 mg via ORAL
  Filled 2024-06-14: qty 2

## 2024-06-14 MED ORDER — LINEZOLID 600 MG/300ML IV SOLN
600.0000 mg | Freq: Two times a day (BID) | INTRAVENOUS | Status: DC
  Administered 2024-06-15 (×2): 600 mg via INTRAVENOUS
  Filled 2024-06-14 (×4): qty 300

## 2024-06-14 MED ORDER — POLYETHYLENE GLYCOL 3350 17 G PO PACK: 17.0000 g | PACK | Freq: Every day | ORAL | Status: DC | PRN

## 2024-06-14 MED ORDER — LINEZOLID 600 MG/300ML IV SOLN
600.0000 mg | Freq: Once | INTRAVENOUS | Status: AC
  Administered 2024-06-14: 600 mg via INTRAVENOUS
  Filled 2024-06-14: qty 300

## 2024-06-14 MED ORDER — POTASSIUM CHLORIDE CRYS ER 20 MEQ PO TBCR
20.0000 meq | EXTENDED_RELEASE_TABLET | Freq: Once | ORAL | Status: AC
  Administered 2024-06-14: 20 meq via ORAL
  Filled 2024-06-14: qty 1

## 2024-06-14 NOTE — ED Triage Notes (Signed)
 Patient sent in by podiatrist for diabetic foot ulcer that has progressed. Patient has right great toe infection that is draining and redness that extends half way up his calf. Redness outlined in triage 06/14/24 1550. Patient is febrile but in no distress in triage.

## 2024-06-14 NOTE — ED Notes (Signed)
 Pt in bed, pt has wound, swelling and redness to his R lower leg and foot, pt resps are even and unlabored, pt calm and cooperative.

## 2024-06-14 NOTE — ED Notes (Addendum)
 Admitting MD at bedside.

## 2024-06-14 NOTE — Progress Notes (Signed)
Pt arrived to the unit.

## 2024-06-14 NOTE — Progress Notes (Signed)
  Subjective:  Patient ID: Calvin Shields, male    DOB: [birth year],   MRN: [id number]  Chief Complaint  Patient presents with   Wound Check    Rm10 Wound check right hallux/diabetic blood sugar 92/ A1c6.2/red swollen and an odor    68 y.o. male presents for follow-up of right great toe wound. Relates doing well and has been dressing as instructed. Does relate the past three days the toe started getting red swollen and had an odor. He alsor relates some nausea lately.  Relates burning and tingling in their feet. Patient is diabetic and last A1c was  Lab Results  Component Value Date   HGBA1C 6.9 11/24/2023   .   PCP:  Leesa Pulling, MD     PCP:  Leesa Pulling, MD    . Denies any other pedal complaints. Denies v/f/c.   Past Medical History:  Diagnosis Date   Diabetes mellitus without complication (HCC)    Hyperlipidemia    Hypertension     Objective:  Physical Exam: Vascular: DP/PT pulses 2/4 bilateral. CFT <3 seconds. Absent hair growth on digits. Edema noted to bilateral lower extremities. Xerosis noted bilaterally.  Skin. No lacerations or abrasions bilateral feet. Nails 1-5 bilateral  are thickened discolored and elongated with subungual debris.  Hyperkeratotic lesion noted to plantar right heel. Distal left third digit hyperkeratosis resolved  Plantar right hallux ulceration noted with infection noted today. Malodor. Probe to bone. Erythema and edema or the toe and purulence noted.  Musculoskeletal: MMT 5/5 bilateral lower extremities in DF, PF, Inversion and Eversion. Deceased ROM in DF of ankle joint.  Neurological: Sensation intact to light touch. Protective sensation diminished bilateral.         Assessment:   1. Diabetic ulcer of toe of right foot associated with diabetes mellitus due to underlying condition, with fat layer exposed (HCC)        Plan:  Patient was evaluated and treated and all questions answered. Ulcer right plantar hallux with concern  for osteomyelitis  Radiogrpahs reviewed and distal phalanx of right hallux with osseous erosions and destruction noted concerning for osteomyelitis.   -Dressed with betadine , DSD. -Discussed clinical and radiographic findings with patient. Concern for osteomyelitis and possible sepsis given systemic symptoms. Discussed his best option is to go to the emergency room for further care. Discussed he will need IV antibiotics,  MRI, ABIs and likely amputation of the toe. Patient expressed understanding and wil lhead this way.  -Notified Dr. Rosemarie Conquest our on call doctor.    No follow-ups on file.   Jennefer Moats, DPM

## 2024-06-14 NOTE — ED Notes (Signed)
 Pt in MRI.

## 2024-06-14 NOTE — H&P (Signed)
 History and Physical    Calvin Shields ZOX:[id number] DOB: Apr 05, 1956 DOA: 06/14/2024  PCP: Leesa Pulling, MD   Patient coming from: Home   Chief Complaint: Right great toe infection, malaise, nausea  HPI: Calvin Shields is a 68 y.o. male with medical history significant for hypertension, hyperlipidemia, type 2 diabetes mellitus, CKD 3A, and right great toe ulcer followed by podiatry who presents with increasing edema, odor, and erythema surrounding the right toe as well as general malaise and nausea.  Patient reports 7 days of progressive swelling, redness, drainage, and odor from his right great toe ulcer and then developed a fever and general malaise today.  He was seen in the podiatry clinic where he was advised to go to the hospital for MRI, ABIs, IV antibiotics, and likely amputation.  ED Course: Upon arrival to the ED, patient is found to be febrile to 38.2 and mildly tachycardic with stable BP.  Labs are most notable for potassium 3.3, creatinine 1.55, WBC 13,800, and normal lactic acid.  Podiatry (Dr. Rosemarie Conquest) was consulted by the ED PA and indicated that it was likely intervene on the toe on 06/16/2024.  Blood cultures were collected and the patient was given acetaminophen , linezolid, and Unasyn.    Review of Systems:  All other systems reviewed and apart from HPI, are negative.  Past Medical History:  Diagnosis Date   Diabetes mellitus without complication (HCC)    Hyperlipidemia    Hypertension     Past Surgical History:  Procedure Laterality Date   AMPUTATION Left 09/08/2017   Procedure: PARTIAL TOE AMPUTATION LEFT HALLUX;  Surgeon: Barbra Ley, DPM;  Location: [facility];  Service: Podiatry;  Laterality: Left;   ANKLE SURGERY Bilateral    BIOPSY  04/03/2023   Procedure: BIOPSY;  Surgeon: Vinetta Greening, DO;  Location: [facility];  Service: Endoscopy;;   COLONOSCOPY WITH PROPOFOL  N/A 04/03/2023   Procedure: COLONOSCOPY WITH PROPOFOL ;  Surgeon:  Vinetta Greening, DO;  Location: [facility];  Service: Endoscopy;  Laterality: N/A;  730am, asa 3   ESOPHAGOGASTRODUODENOSCOPY (EGD) WITH PROPOFOL  N/A 04/03/2023   Procedure: ESOPHAGOGASTRODUODENOSCOPY (EGD) WITH PROPOFOL ;  Surgeon: Vinetta Greening, DO;  Location: [facility];  Service: Endoscopy;  Laterality: N/A;   LUMBAR LAMINECTOMY/DECOMPRESSION MICRODISCECTOMY Left 01/16/2024   Procedure: Microdiscectomy - LUMBAR FIVE-SACRAL ONE-LEFT;  Surgeon: Audie Bleacher, MD;  Location: [facility];  Service: Neurosurgery;  Laterality: Left;  C3   POLYPECTOMY  04/03/2023   Procedure: POLYPECTOMY;  Surgeon: Vinetta Greening, DO;  Location: [facility];  Service: Endoscopy;;   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 He has never been exposed to tobacco smoke. He has never used smokeless tobacco. He reports that he does not drink alcohol and does not use drugs.  Allergies  Allergen Reactions   Codeine Nausea And Vomiting   Fish Allergy     Flares gout   Aspirin  Other (See Comments)    Abdominal Pain   Ibuprofen Other (See Comments)    Abdominal Pain, tolerates taking with food     Family History  Problem Relation Age of Onset   Diabetes Mother    Cancer - Colon Neg Hx    Colon polyps Neg Hx      Prior to Admission medications   Medication Sig Start Date End Date Taking? Authorizing Provider  acetaminophen  (TYLENOL ) 500 MG tablet Take 1,000  mg by mouth every 6 (six) hours as needed for moderate pain (pain score 4-6).    [provider]  allopurinol  (ZYLOPRIM ) 100 MG tablet Take 300 mg by mouth daily.    [provider]  atorvastatin  (LIPITOR) 40 MG tablet Take 40 mg by mouth at bedtime.    [provider]  colchicine 0.6 MG tablet Take 0.6 mg by mouth daily as needed (gout). 12/22/21   [provider]  diclofenac Sodium (VOLTAREN) 1 % GEL Apply 1 Application topically 4 (four)  times daily as needed (pain). 05/20/22   [provider]  Dulaglutide  (TRULICITY ) 0.75 MG/0.5ML SOAJ Inject 0.75 mg into the skin every Tuesday.    [provider]  ibuprofen (ADVIL) 200 MG tablet Take 400 mg by mouth every 6 (six) hours as needed for moderate pain (pain score 4-6).    [provider]  lisinopril -hydrochlorothiazide  (PRINZIDE ,ZESTORETIC ) 20-25 MG per tablet Take 1 tablet by mouth daily.    [provider]  methocarbamol  (ROBAXIN ) 500 MG tablet Take 500 mg by mouth 2 (two) times daily as needed for muscle spasms. 01/13/24   [provider]  Multiple Vitamin (MULTIVITAMIN WITH MINERALS) TABS tablet Take 1 tablet by mouth daily.    [provider]  pantoprazole  (PROTONIX ) 40 MG tablet take 1 tablet once daily. 11/24/23   Eustacio Highman, NP  tiZANidine  (ZANAFLEX ) 4 MG tablet Take 1 tablet (4 mg total) by mouth every 6 (six) hours as needed for muscle spasms. 01/16/24   Audie Bleacher, MD  traMADol (ULTRAM) 50 MG tablet Take 50 mg by mouth every 6 (six) hours as needed for moderate pain (pain score 4-6). 03/13/22   [provider]    Physical Exam: Vitals:   06/14/24 1538 06/14/24 1551 06/14/24 1625 06/14/24 1828  BP: (!) 168/74  (!) 160/77 130/69  Pulse: (!) 107  (!) 107 (!) 106  Resp: 16  20 20   Temp: (!) 100.8 F (38.2 C)   99.4 F (37.4 C)  TempSrc:    Oral  SpO2: 100%  100% 100%  Weight:  104.8 kg    Height:  5' 9 (1.753 m)       Constitutional: NAD, calm  Eyes: PERTLA, lids and conjunctivae normal ENMT: Mucous membranes are moist. Posterior pharynx clear of any exudate or lesions.   Neck: supple, no masses  Respiratory: no wheezing, no crackles. No accessory muscle use.  Cardiovascular: S1 & S2 heard, regular rate and rhythm. No JVD. Abdomen: No distension, no tenderness, soft. Bowel sounds active.  Musculoskeletal: no clubbing / cyanosis. Right great toe ulcerated distally with foul odor, drainage,  edema, and erythema extending up past the ankle.   Skin: no significant rashes, lesions, ulcers aside from right foot findings described above. Warm, dry, well-perfused. Neurologic: CN 2-12 grossly intact. Moving all extremities. Alert and oriented.  Psychiatric: Pleasant. Cooperative.    Labs and Imaging on Admission: I have personally reviewed following labs and imaging studies  CBC: Recent Labs  Lab 06/14/24 1645  WBC 13.8*  NEUTROABS 10.6*  HGB 11.6*  HCT 35.8*  MCV 89.1  PLT 461*   Basic Metabolic Panel: Recent Labs  Lab 06/14/24 1645  NA 136  K 3.3*  CL 93*  CO2 28  GLUCOSE 127*  BUN 14  CREATININE 1.55*  CALCIUM  9.7   GFR: Estimated Creatinine Clearance: 53.6 mL/min (A) (by C-G formula based on SCr of 1.55 mg/dL (H)). Liver Function Tests: Recent Labs  Lab 06/14/24 1645  AST 23  ALT 25  ALKPHOS 119  BILITOT 1.3*  PROT 8.1  ALBUMIN  3.5   No results for input(s): LIPASE, AMYLASE in the last 168 hours. No results for input(s): AMMONIA in the last 168 hours. Coagulation Profile: No results for input(s): INR, PROTIME in the last 168 hours. Cardiac Enzymes: No results for input(s): CKTOTAL, CKMB, CKMBINDEX, TROPONINI in the last 168 hours. BNP (last 3 results) No results for input(s): PROBNP in the last 8760 hours. HbA1C: No results for input(s): HGBA1C in the last 72 hours. CBG: No results for input(s): GLUCAP in the last 168 hours. Lipid Profile: No results for input(s): CHOL, HDL, LDLCALC, TRIG, CHOLHDL, LDLDIRECT in the last 72 hours. Thyroid  Function Tests: No results for input(s): TSH, T4TOTAL, FREET4, T3FREE, THYROIDAB in the last 72 hours. Anemia Panel: No results for input(s): VITAMINB12, FOLATE, FERRITIN, TIBC, IRON, RETICCTPCT in the last 72 hours. Urine analysis: No results found for: COLORURINE, APPEARANCEUR, LABSPEC, PHURINE, GLUCOSEU, HGBUR, BILIRUBINUR, KETONESUR,  PROTEINUR, UROBILINOGEN, NITRITE, LEUKOCYTESUR Sepsis Labs: @LABRCNTIP (procalcitonin:4,lacticidven:4) )No results found for this or any previous visit (from the past 240 hours).   Radiological Exams on Admission: No results found.   Assessment/Plan   1. Diabetic right great toe infection  - Check MRI and ABI, continue current antibiotics, follow cultures and clinical course    2. Type II DM  - A1c was 6.4% in March 2025  - Check CBGs and use low-intensity SSI for now    3. Hypertension  - There is concern for early/developing sepsis and antihypertensives are held on admission, will treat as-needed only for now    4. Hypokalemia  - Replacing    5. CKD 3A  - Appears close to baseline  - Renally-dose medications, monitor    DVT prophylaxis: Lovenox   Code Status: Full  Level of Care: Level of care: Med-Surg Family Communication: None present   Disposition Plan:  Patient is from: home  Anticipated d/c is to: TBD Anticipated d/c date is: 06/18/24  Patient currently: Pending treatment of sepsis, likely surgery  Consults called: Podiatry  Admission status: Inpatient     Walton Guppy, MD [facility]  06/14/2024, 7:33 PM

## 2024-06-14 NOTE — ED Provider Notes (Signed)
 [place] [facility]ENC[facility] DEPARTMENT AT [facility] Provider Note   CSN: [id number] Arrival date & time: 06/14/24  1446     Patient presents with: Leg Infection   Nafis Farnan is a 68 y.o. male. DM, HTN 68 year old male with a past medical history of diabetes presents to the ED from podiatrist office due to right great toe infection.  Patient reports he has had the wound on bottom of the right great toe for approximately 2 weeks, reports this has worsened over the last couple days.  Reports today, he began to feel nauseated, not feeling like myself , evaluated by podiatrist and sent to the ED for admission.  He has not been taking any oral antibiotics at home to help with his symptoms.  He arrived to the ED with a Tmax of 100.8.  He denies any other complaints at this time.  The history is provided by the patient.       Prior to Admission medications   Medication Sig Start Date End Date Taking? Authorizing Provider  acetaminophen  (TYLENOL ) 500 MG tablet Take 1,000 mg by mouth every 6 (six) hours as needed for moderate pain (pain score 4-6).    [provider]  allopurinol  (ZYLOPRIM ) 100 MG tablet Take 300 mg by mouth daily.    [provider]  atorvastatin  (LIPITOR) 40 MG tablet Take 40 mg by mouth at bedtime.    [provider]  colchicine 0.6 MG tablet Take 0.6 mg by mouth daily as needed (gout). 12/22/21   [provider]  diclofenac Sodium (VOLTAREN) 1 % GEL Apply 1 Application topically 4 (four) times daily as needed (pain). 05/20/22   [provider]  Dulaglutide  (TRULICITY ) 0.75 MG/0.5ML SOAJ Inject 0.75 mg into the skin every Tuesday.    [provider]  ibuprofen (ADVIL) 200 MG tablet Take 400 mg by mouth every 6 (six) hours as needed for moderate pain (pain score 4-6).    [provider]  lisinopril -hydrochlorothiazide  (PRINZIDE ,ZESTORETIC ) 20-25 MG per tablet Take 1 tablet by mouth daily.    [provider]  methocarbamol  (ROBAXIN ) 500 MG tablet Take 500 mg by mouth 2 (two) times daily as needed for muscle spasms. 01/13/24   [provider]  Multiple Vitamin (MULTIVITAMIN WITH MINERALS) TABS tablet Take 1 tablet by mouth daily.    [provider]  pantoprazole  (PROTONIX ) 40 MG tablet take 1 tablet once daily. 11/24/23   Eustacio Highman, NP  tiZANidine  (ZANAFLEX ) 4 MG tablet Take 1 tablet (4 mg total) by mouth every 6 (six) hours as needed for muscle spasms. 01/16/24   Audie Bleacher, MD  traMADol (ULTRAM) 50 MG tablet Take 50 mg by mouth every 6 (six) hours as needed for moderate pain (pain score 4-6). 03/13/22   [provider]    Allergies: Codeine, Fish allergy, Aspirin , and Ibuprofen    Review of Systems  Constitutional:  Positive for fever.  Respiratory:  Negative for shortness of breath.   Cardiovascular:  Negative for chest pain.  Gastrointestinal:  Positive for nausea. Negative for abdominal pain.  Skin:  Positive for wound.  All other systems reviewed and are negative.   Updated Vital Signs BP 130/69 (BP Location: Left Arm)   Pulse (!) 106   Temp 99.4 F (37.4 C) (Oral)   Resp 20   Ht 5' 9 (1.753 m)   Wt 104.8 kg   SpO2 100%   BMI 34.11 kg/m   Physical Exam Vitals and nursing note reviewed.  Constitutional:      Appearance: Normal appearance.  HENT:     Head: Normocephalic and atraumatic.     Mouth/Throat:     Mouth: Mucous membranes are moist.   Cardiovascular:     Rate and Rhythm: Normal rate.     Pulses:          Posterior tibial pulses are 2+ on the right side.  Pulmonary:     Effort: Pulmonary effort is normal.     Breath sounds: No wheezing or rales.  Abdominal:     General: Abdomen is flat.     Palpations: Abdomen is soft.   Musculoskeletal:     Cervical back: Normal range of motion.  Feet:     Right foot:     Skin integrity: Ulcer, skin breakdown, erythema and warmth present.     Comments: Tracking,  streaking all the way up to below the right calf.  Please see photos attached.  Skin:    General: Skin is warm and dry.     Findings: Erythema present.   Neurological:     Mental Status: He is alert and oriented to person, place, and time.     (all labs ordered are listed, but only abnormal results are displayed) Labs Reviewed  COMPREHENSIVE METABOLIC PANEL WITH GFR - Abnormal; Notable for the following components:      Result Value   Potassium 3.3 (*)    Chloride 93 (*)    Glucose, Bld 127 (*)    Creatinine, Ser 1.55 (*)    Total Bilirubin 1.3 (*)    GFR, Estimated 48 (*)    All other components within normal limits  CBC WITH DIFFERENTIAL/PLATELET - Abnormal; Notable for the following components:   WBC 13.8 (*)    RBC 4.02 (*)    Hemoglobin 11.6 (*)    HCT 35.8 (*)    Platelets 461 (*)    Neutro Abs 10.6 (*)    Monocytes Absolute 1.3 (*)    All other components within normal limits  CULTURE, BLOOD (ROUTINE X 2)  CULTURE, BLOOD (ROUTINE X 2)  I-STAT CG4 LACTIC ACID, ED    EKG: None  Radiology: No results found.   Medications Ordered in the ED  Ampicillin-Sulbactam (UNASYN) 3 g in sodium chloride  0.9 % 100 mL IVPB (0 g Intravenous Stopped 06/14/24 1832)    And  linezolid (ZYVOX) IVPB 600 mg (600 mg Intravenous New Bag/Given 06/14/24 1832)  acetaminophen  (TYLENOL ) tablet 1,000 mg (1,000 mg Oral Given 06/14/24 1705)    Clinical Course as of 06/14/24 1926  Mon Jun 14, 2024  1852 WBC(!): 13.8 [JS]  1852 Creatinine(!): 1.55 [JS]    Clinical Course User Index [JS] Eriyanna Kofoed, PA-C                                Medical Decision Making Amount and/or Complexity of Data Reviewed Labs:  Decision-making details documented in ED Course.  Risk OTC drugs. Decision regarding hospitalization.    This patient presents to the ED for concern of wound, this involves a number of treatment options, and is a complaint that carries with it a high risk of complications and  morbidity.  The differential diagnosis includes cellulitis versus osteomyelitis versus diabetic foot.    Co morbidities: Discussed in HPI   Brief History:  See HPI.   EMR reviewed including pt PMHx, past surgical history and past visits to ER.  See HPI for more details   Lab Tests:  I ordered and independently interpreted labs.  The pertinent results include:    Interpretation physical revealed a CBC with a leukocytosis of 13.8, hemoglobin slightly decreased.  CMP with slight decrease in potassium slightly decreased.  Creatinine is increasing at 1.5.  Lactic acid is negative.  Imaging Studies:  MRI Right foot is pending   Medicines ordered:  I ordered medication including unasyn  for diabetic foot  Reevaluation of the patient after these medicines showed that the patient stayed the same I have reviewed the patients home medicines and have made adjustments as needed  Consults:  I requested consultation with Dr. Rosemarie Conquest,  and discussed lab and imaging findings as well as pertinent plan - they recommend: ABI studies, MRI foot and admission, will likely have intervention Wednesday.   Reevaluation:  After the interventions noted above I re-evaluated patient and found that they have :stayed the same  Social Determinants of Health:  The patient's social determinants of health were a factor in the care of this patient  Problem List / ED Course:  Patient presented to the ED with a chief complaint of right foot pain has been ongoing for weeks, reports today he began to feel nauseous, overall not himself , evaluated by podiatrist office Dr.Sikora, who recommended patient be evaluated in the emergency department.  On arrival, patient is febrile with a temperature of 100.  Interpretation of his blood work reveals CBC with a leukocytosis of 13.8, hemoglobin is slightly decreased.  CMP with no electrolyte derangement aside from some slight mild hypokalemia.  Creatinine level is  worsened than prior.  LFTs are within normal limits.  Lactic acid is negative.  Blood cultures were also obtained and sent off. He was provided with Tylenol  to help with pyrexia, also started on Unasyn for diabetic foot infection. Dr. Rosemarie Conquest of  podiatry was contacted and recommended ABI studies, will need MRI of his foot and admission with likely intervention on Wednesday.  Patient remains hemodynamically stable, will place call for hospitalist admission at this time. Spoke to Dr. Brice Campi hospitalist service, appreciate his assistance.  Patient with underlying systemic infection meets criteria for admission at this time.  Hemodynamically stable. \ Dispostion:  After consideration of the diagnostic results and the patients response to treatment, I feel that the patent would benefit from admission for further management of their diabetic foot infection.    Portions of this note were generated with Scientist, clinical (histocompatibility and immunogenetics). Dictation errors may occur despite best attempts at proofreading.   Final diagnoses:  Right foot infection  Fever, unspecified fever cause    ED Discharge Orders     None          Luellen Sages, PA-C 06/14/24 1927    Rolinda Climes, DO 06/14/24 2306

## 2024-06-14 NOTE — ED Provider Triage Note (Signed)
 Emergency Medicine Provider Triage Evaluation Note  [facility] , a 68 y.o. male  was evaluated in triage.  Pt complains of right great toe infection.  Patient sent from [facility].  Per note, Dr. Rosemarie Conquest is aware of patient.  Review of Systems  Positive: Fever, pain, redness, tachycardia Negative: Chest pain, shortness of breath  Physical Exam  BP (!) 168/74 (BP Location: Right Arm)   Pulse (!) 107   Temp (!) 100.8 F (38.2 C)   Resp 16   Ht 5' 9 (1.753 m)   Wt 104.8 kg   SpO2 100%   BMI 34.11 kg/m  Gen:   Awake, no distress   Resp:  Normal effort  MSK:   Moves extremities without difficulty  Other:  Obvious foot infection, with red streaking up to mid right calf  Medical Decision Making  Medically screening exam initiated at 3:56 PM.  Appropriate orders placed.  [facility] was informed that the remainder of the evaluation will be completed by another provider, this initial triage assessment does not replace that evaluation, and the importance of remaining in the ED until their evaluation is complete.  Secure message Dr. Rosemarie Conquest for guidance on imaging and antibiotic preferences.  Will place labs and imaging accordingly.   Carie Charity, PA-C 06/14/24 [phone]

## 2024-06-14 NOTE — ED Notes (Signed)
 Extra urine sent to main lab

## 2024-06-15 ENCOUNTER — Inpatient Hospital Stay (HOSPITAL_COMMUNITY)

## 2024-06-15 DIAGNOSIS — E11628 Type 2 diabetes mellitus with other skin complications: Secondary | ICD-10-CM

## 2024-06-15 DIAGNOSIS — M869 Osteomyelitis, unspecified: Secondary | ICD-10-CM

## 2024-06-15 DIAGNOSIS — N1831 Chronic kidney disease, stage 3a: Secondary | ICD-10-CM

## 2024-06-15 DIAGNOSIS — M79674 Pain in right toe(s): Secondary | ICD-10-CM | POA: Diagnosis not present

## 2024-06-15 DIAGNOSIS — I10 Essential (primary) hypertension: Secondary | ICD-10-CM

## 2024-06-15 DIAGNOSIS — E1122 Type 2 diabetes mellitus with diabetic chronic kidney disease: Secondary | ICD-10-CM

## 2024-06-15 DIAGNOSIS — L0390 Cellulitis, unspecified: Secondary | ICD-10-CM

## 2024-06-15 DIAGNOSIS — L089 Local infection of the skin and subcutaneous tissue, unspecified: Secondary | ICD-10-CM | POA: Diagnosis not present

## 2024-06-15 DIAGNOSIS — A419 Sepsis, unspecified organism: Secondary | ICD-10-CM

## 2024-06-15 DIAGNOSIS — E876 Hypokalemia: Secondary | ICD-10-CM

## 2024-06-15 LAB — GLUCOSE, CAPILLARY
Glucose-Capillary: 184 mg/dL — ABNORMAL HIGH (ref 70–99)
Glucose-Capillary: 128 mg/dL — ABNORMAL HIGH (ref 70–99)
Glucose-Capillary: 149 mg/dL — ABNORMAL HIGH (ref 70–99)
Glucose-Capillary: 135 mg/dL — ABNORMAL HIGH (ref 70–99)

## 2024-06-15 LAB — CBC
HCT: 32.2 % — ABNORMAL LOW (ref 39.0–52.0)
Hemoglobin: 10.7 g/dL — ABNORMAL LOW (ref 13.0–17.0)
MCH: 28.9 pg (ref 26.0–34.0)
MCHC: 33.2 g/dL (ref 30.0–36.0)
MCV: 87 fL (ref 80.0–100.0)
Platelets: 467 K/uL — ABNORMAL HIGH (ref 150–400)
RBC: 3.7 MIL/uL — ABNORMAL LOW (ref 4.22–5.81)
RDW: 13.7 % (ref 11.5–15.5)
WBC: 12.8 K/uL — ABNORMAL HIGH (ref 4.0–10.5)
nRBC: 0 % (ref 0.0–0.2)

## 2024-06-15 LAB — C-REACTIVE PROTEIN: CRP: 24.7 mg/dL — ABNORMAL HIGH (ref <1.0)

## 2024-06-15 LAB — BASIC METABOLIC PANEL WITH GFR
Anion gap: 13 (ref 5–15)
BUN: 12 mg/dL (ref 8–23)
CO2: 25 mmol/L (ref 22–32)
Calcium: 9 mg/dL (ref 8.9–10.3)
Chloride: 97 mmol/L — ABNORMAL LOW (ref 98–111)
Creatinine, Ser: 1.2 mg/dL (ref 0.61–1.24)
GFR, Estimated: >60 mL/min (ref >60)
Glucose, Bld: 128 mg/dL — ABNORMAL HIGH (ref 70–99)
Potassium: 3.3 mmol/L — ABNORMAL LOW (ref 3.5–5.1)
Sodium: 135 mmol/L (ref 135–145)

## 2024-06-15 LAB — PREALBUMIN: Prealbumin: 13 mg/dL — ABNORMAL LOW (ref 18–38)

## 2024-06-15 LAB — HEMOGLOBIN A1C
Hgb A1c MFr Bld: 6.2 % — ABNORMAL HIGH (ref 4.8–5.6)
Mean Plasma Glucose: 131.24 mg/dL

## 2024-06-15 LAB — HIV ANTIBODY (ROUTINE TESTING W REFLEX): HIV Screen 4th Generation wRfx: NONREACTIVE

## 2024-06-15 LAB — SEDIMENTATION RATE: Sed Rate: 69 mm/h — ABNORMAL HIGH (ref 0–16)

## 2024-06-15 LAB — MAGNESIUM: Magnesium: 1.7 mg/dL (ref 1.7–2.4)

## 2024-06-15 MED ORDER — GABAPENTIN 300 MG PO CAPS
300.0000 mg | ORAL_CAPSULE | Freq: Two times a day (BID) | ORAL | Status: DC
  Administered 2024-06-15 – 2024-06-17 (×4): 300 mg via ORAL
  Filled 2024-06-15 (×4): qty 1

## 2024-06-15 MED ORDER — ENSURE PLUS HIGH PROTEIN PO LIQD
237.0000 mL | Freq: Two times a day (BID) | ORAL | Status: DC
  Administered 2024-06-15 – 2024-06-17 (×5): 237 mL via ORAL

## 2024-06-15 MED ORDER — JUVEN PO PACK
1.0000 | PACK | Freq: Two times a day (BID) | ORAL | Status: DC
  Administered 2024-06-15 – 2024-06-17 (×4): 1 via ORAL
  Filled 2024-06-15 (×4): qty 1

## 2024-06-15 MED ORDER — FAMOTIDINE 20 MG PO TABS: 20.0000 mg | ORAL_TABLET | Freq: Two times a day (BID) | ORAL | Status: DC | PRN

## 2024-06-15 MED ORDER — POTASSIUM CHLORIDE CRYS ER 20 MEQ PO TBCR
60.0000 meq | EXTENDED_RELEASE_TABLET | Freq: Once | ORAL | Status: AC
  Administered 2024-06-15: 60 meq via ORAL
  Filled 2024-06-15: qty 3

## 2024-06-15 MED ORDER — ALLOPURINOL 300 MG PO TABS
300.0000 mg | ORAL_TABLET | Freq: Every day | ORAL | Status: DC
  Administered 2024-06-15 – 2024-06-17 (×2): 300 mg via ORAL
  Filled 2024-06-15 (×2): qty 1

## 2024-06-15 MED ORDER — ADULT MULTIVITAMIN W/MINERALS CH
1.0000 | ORAL_TABLET | Freq: Every day | ORAL | Status: DC
  Administered 2024-06-15 – 2024-06-17 (×2): 1 via ORAL
  Filled 2024-06-15 (×2): qty 1

## 2024-06-15 NOTE — Consult Note (Signed)
 VAT: consult for occluded PIV.  PIV dressing changed, flushed, and patent. No c/o of pain at IV from pt.  Consult complete

## 2024-06-15 NOTE — Progress Notes (Signed)
 Consent for Amputation right great toe signed and placed in patient chart

## 2024-06-15 NOTE — Progress Notes (Signed)
 Sent provider a message informing him that the patient was yellow MEWS due to HR and temp, made him aware of the medication I had given and the measures I had taken regarding room temp and covers being removed in addition to the Tylenol . See MAR. Provider stated to place ice packs under the arms.

## 2024-06-15 NOTE — Discharge Instructions (Signed)
 High-Protein and High-Calorie Diet Eating high-protein and high-calorie foods can help you to gain weight, heal after an injury, and get better after an illness or surgery. The amount of daily protein and calories you need depends on: Your body weight. The reason you were told to follow this diet. Usually, a high-protein, high-calorie diet means that you should: Eat 250-500 extra calories each day. Make sure that you get enough of your daily calories from protein. Ask your health care provider how many of your calories should come from protein and how many calories total you need each day. Follow the diet as told by your provider. What are tips for following this plan? Reading food labels Check the nutrition facts label for calories, and grams of fat and protein. Items with more than 4 grams of protein are high-protein foods. General information  Ask your provider if you should take a nutritional supplement. Try to eat six small meals each day instead of three large meals. A goal is usually to eat every 2 to 3 hours. Eat a balanced diet. In each meal, include one food that's high in protein and one food with fat in it. Keep nutritious snacks available, such as nuts, trail mixes, dried fruit, and whole-milk yogurt. If you have kidney disease or diabetes, talk with your provider about how much protein is safe for you. Too much protein may put extra stress on your kidneys. Replace zero-calorie drinks with drinks that have calories in them, such as milk and 100% fruit juice. Consider setting a timer to remind you to eat. You'll want to eat even if you do not feel very hungry. Preparing meals Milk and dairy foods. Add whole milk, half-and-half, or heavy cream to cereal, pudding, soup, or hot cocoa. Add whole milk to instant breakfast drinks. Add powdered milk to baked goods, smoothies, or milkshakes. Add powdered milk, cream, or butter to mashed potatoes. Replace water with milk or heavy cream  when making foods such as oatmeal, pudding, or cocoa. Make cream-based pastas and soups. Add cheese to cooked vegetables. Make whole-milk yogurt parfaits. Top them with granola, fruit, or nuts. Add cottage cheese to fruit. Add cream cheese to sandwiches or as a topping on crackers and bread. Eggs. Add hard-boiled eggs to salads. Keep hard-boiled eggs in the fridge to snack on. Add cheese to cooked eggs. Beans, nuts, and seeds. Add peanut butter to oatmeal or smoothies. Use peanut butter as a dip for fruits and vegetables or as a topping for pretzels, celery, or crackers. Add beans to casseroles, dips, and spreads. Add pureed beans to sauces and soups. Salads, soups, and other foods. Add avocado, cheese, or both to sandwiches or salads. Add avocado to smoothies. Add meat, poultry, or seafood to rice, pasta, casseroles, salads, and soups. Use mayonnaise when making egg salad, chicken salad, or tuna salad. Add oil or butter to cooked vegetables and grains. What high-protein foods should I eat?  Vegetables Soybeans. Peas. Grains Quinoa. Bulgur wheat. Buckwheat. Meats and other proteins Beef, pork, and poultry. Fish and seafood. Eggs. Tofu. Textured vegetable protein (TVP). Peanut butter. Nuts and seeds. Dried beans. Protein powders. Hummus. Jerky. Dairy Whole milk. Whole-milk yogurt. Powdered milk. Cheese. Cottage cheese. Eggnog. Beverages High-protein supplement drinks. Soy milk. Other foods Protein bars. The items listed above may not be all the foods and drinks you can have. Talk with an expert in healthy eating called a dietitian to learn more. What high-calorie foods should I eat? Fruits Dried fruit. Fruit leather. Canned  fruit in syrup. Fruit juice. Avocado. Vegetables Vegetables cooked in oil or butter. Fried potatoes. Grains Pasta. Quick breads. Muffins. Pancakes. Granola. Meats and other proteins Peanut butter and other nut butters. Nuts and seeds. Dairy Heavy cream.  Whipped cream. Cream cheese. Sour cream. Ice cream. Custard. Pudding. Whole-milk dairy products. Beverages Meal-replacement beverages. Nutrition shakes. Fruit juice. Seasonings and condiments Salad dressing. Mayonnaise. Alfredo sauce. Fruit preserves or jelly. Honey. Syrup. Sweets and desserts Cake. Cookies. Pie. Pastries. Candy bars. Chocolate. Fats and oils Butter or margarine. Oil. Gravy. Other foods Meal-replacement bars. The items listed above may not be all the foods and drinks you can have. Talk with an expert in healthy eating to learn more. This information is not intended to replace advice given to you by your health care provider. Make sure you discuss any questions you have with your health care provider. Document Revised: 05/12/2023 Document Reviewed: 05/12/2023 Elsevier Patient Education  2024 [employer].

## 2024-06-15 NOTE — Progress Notes (Signed)
 PROGRESS NOTE  Calvin Shields AOZ:[id number] DOB: 1956-03-22   PCP: Leesa Pulling, MD  Patient is from: Home.  DOA: 06/14/2024 LOS: 1  Chief complaints Chief Complaint  Patient presents with   Leg Infection     Brief Narrative / Interim history: 68 year old M with PMH of DM-2, CKD-3A, HTN, HLD and right great toe ulcer followed by podiatry sent to ED by his podiatrist for right great toe ulcer evaluation after he presented today with progressive swelling, redness, drainage and odor from right great toe ulcer for about 7 days.  In ED, febrile, tachycardic with leukocytosis meeting sepsis criteria.  Lactic acid within normal.  MRI right foot suggested cellulitis and osteomyelitis.  CRP elevated to 25.  Started on Zyvox and Unasyn.  ABI ordered and pending.  Podiatry to perform surgery on 6/18.  Subjective: Seen and examined earlier this morning.  No major events overnight or this morning.  No complaints.  Denies pain.  Objective: Vitals:   06/14/24 2116 06/15/24 0458 06/15/24 0757 06/15/24 1129  BP: 137/64 127/71 117/72 132/73  Pulse: 99 (!) 114 93 84  Resp: 16 16  17   Temp: 99.4 F (37.4 C) (!) 100.9 F (38.3 C) 98.6 F (37 C)   TempSrc: Oral Oral Oral   SpO2: 98% 96% 98% 99%  Weight:      Height:        Examination:  GENERAL: No apparent distress.  Nontoxic. HEENT: MMM.  Vision and hearing grossly intact.  NECK: Supple.  No apparent JVD.  RESP:  No IWOB.  Fair aeration bilaterally. CVS:  RRR. Heart sounds normal.  ABD/GI/GU: BS+. Abd soft, NTND.  MSK/EXT:  Moves extremities. No apparent deformity.  Right great toe ulceration.  No apparent drainage but foul-smelling.  Patient has severely diminished sensation in his foot.  Not able to palpate pulses but no signs of critical ischemia.  See picture under media for more SKIN: As above. NEURO: AA.  Oriented appropriately.  No apparent focal neuro deficit. PSYCH: Calm. Normal affect.   Consultants:   Podiatric  Procedures: None  Microbiology summarized: Blood cultures NGTD  Assessment and plan: Sepsis due to diabetic right great toe infection/osteomyelitis: Present on arrival.  Has fever, leukocytosis and tachycardia on presentation.  No signs of endorgan damage.  CRP elevated to 25.  MRI showing cellulitis/osteomyelitis of right great toe.  Patient has severely diminished sensation in his right foot.  Difficult to palpate DP and PT pulses -Continue IV Unasyn and Zyvox -Glycemic control and Lipitor -Follow ABI - Scheduled for grade 2 amputation on 6/18.    NIDDM-2 with CKD-3A, diabetic foot ulcer, HLD, neuropathy and hyperglycemia: A1c 6.2%.  On Trulicity  at home. Recent Labs  Lab 06/14/24 2116 06/15/24 0819 06/15/24 1126  GLUCAP 181* 184* 128*  -Continue current insulin  regimen-very sensitive SSI -Continue Lipitor and gabapentin - Hold home Trulicity   CKD-3A: Cr better than baseline. Recent Labs    11/17/23 1318 01/16/24 1438 01/16/24 2322 06/14/24 1645 06/15/24 0635  BUN 14 18  --  14 12  CREATININE 1.32* 1.37* 1.42* 1.55* 1.20  - Continue monitoring  Essential hypertension: Normotensive - Resume home lisinopril  - Hold HCTZ  Hypokalemia -Monitor replenish K and Mg as appropriate  GERD - Continue home Pepcid   Class I obesity Body mass index is 34.11 kg/m. Nutrition Problem: Increased nutrient needs Etiology: post-op healing Signs/Symptoms: estimated needs Interventions: Refer to RD note for recommendations   DVT prophylaxis:  enoxaparin  (LOVENOX ) injection 40 mg Start: 06/14/24  1945  Code Status: Full code Family Communication: None at bedside Level of care: Med-Surg Status is: Inpatient Remains inpatient appropriate because: Sepsis due to left great toe ulcer, cellulitis and osteomyelitis   Final disposition: Likely home after surgery   55 minutes with more than 50% spent in reviewing records, counseling patient/family and coordinating  care.   Sch Meds:  Scheduled Meds:  atorvastatin   40 mg Oral QHS   enoxaparin  (LOVENOX ) injection  40 mg Subcutaneous Q24H   feeding supplement  237 mL Oral BID BM   insulin  aspart  0-5 Units Subcutaneous QHS   insulin  aspart  0-6 Units Subcutaneous TID WC   multivitamin with minerals  1 tablet Oral Daily   nutrition supplement (JUVEN)  1 packet Oral BID BM   Continuous Infusions:  ampicillin-sulbactam (UNASYN) IV 3 g (06/15/24 1122)   linezolid (ZYVOX) IV 600 mg (06/15/24 0627)   PRN Meds:.acetaminophen  **OR** acetaminophen , fentaNYL  (SUBLIMAZE ) injection, hydrALAZINE, oxyCODONE , polyethylene glycol, prochlorperazine   Antimicrobials: Anti-infectives (From admission, onward)    Start     Dose/Rate Route Frequency Ordered Stop   06/15/24 0630  linezolid (ZYVOX) IVPB 600 mg       Placed in And Linked Group   600 mg 300 mL/hr over 60 Minutes Intravenous Every 12 hours 06/14/24 1933 06/22/24 0959   06/15/24 0000  Ampicillin-Sulbactam (UNASYN) 3 g in sodium chloride  0.9 % 100 mL IVPB        3 g 200 mL/hr over 30 Minutes Intravenous Every 6 hours 06/14/24 1937     06/14/24 1630  Ampicillin-Sulbactam (UNASYN) 3 g in sodium chloride  0.9 % 100 mL IVPB       Placed in And Linked Group   3 g 200 mL/hr over 30 Minutes Intravenous  Once 06/14/24 1616 06/14/24 1832   06/14/24 1630  linezolid (ZYVOX) IVPB 600 mg       Placed in And Linked Group   600 mg 300 mL/hr over 60 Minutes Intravenous  Once 06/14/24 1616 06/14/24 1932        I have personally reviewed the following labs and images: CBC: Recent Labs  Lab 06/14/24 1645 06/15/24 0635  WBC 13.8* 12.8*  NEUTROABS 10.6*  --   HGB 11.6* 10.7*  HCT 35.8* 32.2*  MCV 89.1 87.0  PLT 461* 467*   BMP &GFR Recent Labs  Lab 06/14/24 1645 06/15/24 0635  NA 136 135  K 3.3* 3.3*  CL 93* 97*  CO2 28 25  GLUCOSE 127* 128*  BUN 14 12  CREATININE 1.55* 1.20  CALCIUM  9.7 9.0  MG  --  1.7   Estimated Creatinine Clearance:  69.3 mL/min (by C-G formula based on SCr of 1.2 mg/dL). Liver & Pancreas: Recent Labs  Lab 06/14/24 1645  AST 23  ALT 25  ALKPHOS 119  BILITOT 1.3*  PROT 8.1  ALBUMIN  3.5   No results for input(s): LIPASE, AMYLASE in the last 168 hours. No results for input(s): AMMONIA in the last 168 hours. Diabetic: Recent Labs    06/15/24 0630  HGBA1C 6.2*   Recent Labs  Lab 06/14/24 2116 06/15/24 0819 06/15/24 1126  GLUCAP 181* 184* 128*   Cardiac Enzymes: No results for input(s): CKTOTAL, CKMB, CKMBINDEX, TROPONINI in the last 168 hours. No results for input(s): PROBNP in the last 8760 hours. Coagulation Profile: No results for input(s): INR, PROTIME in the last 168 hours. Thyroid  Function Tests: No results for input(s): TSH, T4TOTAL, FREET4, T3FREE, THYROIDAB in the last 72 hours. Lipid Profile: No  results for input(s): CHOL, HDL, LDLCALC, TRIG, CHOLHDL, LDLDIRECT in the last 72 hours. Anemia Panel: No results for input(s): VITAMINB12, FOLATE, FERRITIN, TIBC, IRON, RETICCTPCT in the last 72 hours. Urine analysis: No results found for: COLORURINE, APPEARANCEUR, LABSPEC, PHURINE, GLUCOSEU, HGBUR, BILIRUBINUR, KETONESUR, PROTEINUR, UROBILINOGEN, NITRITE, LEUKOCYTESUR Sepsis Labs: Invalid input(s): PROCALCITONIN, LACTICIDVEN  Microbiology: Recent Results (from the past 240 hours)  Blood Cultures x 2 sites     Status: None (Preliminary result)   Collection Time: 06/14/24  4:45 PM   Specimen: BLOOD  Result Value Ref Range Status   Specimen Description BLOOD RIGHT ANTECUBITAL  Final   Special Requests   Final    BOTTLES DRAWN AEROBIC AND ANAEROBIC Blood Culture results may not be optimal due to an inadequate volume of blood received in culture bottles   Culture   Final    NO GROWTH < 24 HOURS Performed at [facility], [address], [place], [place] [address]    Report Status PENDING   Incomplete  Blood Cultures x 2 sites     Status: None (Preliminary result)   Collection Time: 06/14/24  4:51 PM   Specimen: BLOOD LEFT HAND  Result Value Ref Range Status   Specimen Description BLOOD LEFT HAND  Final   Special Requests   Final    BOTTLES DRAWN AEROBIC AND ANAEROBIC Blood Culture results may not be optimal due to an inadequate volume of blood received in culture bottles   Culture   Final    NO GROWTH < 24 HOURS Performed at [facility], [address], [place], [place] [address]    Report Status PENDING  Incomplete    Radiology Studies: MR FOOT RIGHT WO CONTRAST Result Date: 06/14/2024 EXAM DESCRIPTION: MR FOOT RIGHT WO CONTRAST CLINICAL HISTORY: Osteonecrosis suspected, foot, xray done; Soft tissue infection suspected, foot, xray done COMPARISON: None Available. TECHNIQUE: MRI of the foot is performed according to our usual protocol with multiplanar multi sequence imaging. FINDINGS: There is a soft tissue wound/ulceration about the tuft of the first digit. Moderate phlegmonous changes in the surrounding soft tissues. Moderate to severe marrow edema. Cortical erosion/destruction to the distal tuft. Findings consistent with osteomyelitis. No drainable collection. The marrow signal is otherwise unremarkable. Moderate to severe dorsal subcutaneous edema suggesting cellulitis. Moderate to severe atrophy of the musculature. The tendons are unremarkable. No Morton's neuroma. IMPRESSION: Soft tissue wound/ulceration about the tuft of the first digit with moderate phlegmonous changes. No drainable collection. Moderate to severe marrow edema to the tuft as well as cortical destruction distally consistent with osteomyelitis. Moderate to severe dorsal subcutaneous edema suggesting cellulitis. Moderate to severe muscle atrophy. Electronically signed by: Basilio Both MD 06/14/2024 10:33 PM EDT RP Workstation: [id number]      Aluna Whiston T. Elesa Garman [facility]  If 7PM-7AM, please  contact night-coverage [email or web address] 06/15/2024, 1:36 PM

## 2024-06-15 NOTE — Consult Note (Signed)
 PODIATRY CONSULTATION  NAME Calvin Shields MRN [id number] DOB [birth year] DOA 06/14/2024   Reason for consult:  Chief Complaint  Patient presents with   Leg Infection    Attending/Consulting physician: T. Mae Schlossman MD  History of present illness: Calvin Shields is a 68 y.o. male with medical history significant for hypertension, hyperlipidemia, type 2 diabetes mellitus, CKD 3A, and right great toe ulcer followed by podiatry who presents with increasing edema, odor, and erythema surrounding the right toe as well as general malaise and nausea.   Patient reports 7 days of progressive swelling, redness, drainage, and odor from his right great toe ulcer and then developed a fever and general malaise today.  He was seen in the podiatry clinic where he was advised to go to the hospital for MRI, ABIs, IV antibiotics, and likely amputation.  Discussed with patient has had the wound for some time not sure how it started but does report a history of neuropathy due to diabetes.  He reports that the swelling has gotten a lot worse in his right foot and ankle recently.  Has noticed increased redness and drainage from the great toe.  Has had his blood flow assessed approximately in 2022 but has not had it checked recently.  Past Medical History:  Diagnosis Date   Diabetes mellitus without complication (HCC)    Hyperlipidemia    Hypertension        Latest Ref Rng & Units 06/15/2024    6:35 AM 06/14/2024    4:45 PM 01/16/2024   11:22 PM  CBC  WBC 4.0 - 10.5 K/uL 12.8  13.8  11.3   Hemoglobin 13.0 - 17.0 g/dL 40.9  81.1  91.4   Hematocrit 39.0 - 52.0 % 32.2  35.8  38.0   Platelets 150 - 400 K/uL 467  461  356        Latest Ref Rng & Units 06/15/2024    6:35 AM 06/14/2024    4:45 PM 01/16/2024   11:22 PM  BMP  Glucose 70 - 99 mg/dL 782  956    BUN 8 - 23 mg/dL 12  14    Creatinine 2.13 - 1.24 mg/dL 0.86  5.78  4.69   Sodium 135 - 145 mmol/L 135  136    Potassium 3.5 - 5.1 mmol/L 3.3  3.3     Chloride 98 - 111 mmol/L 97  93    CO2 22 - 32 mmol/L 25  28    Calcium  8.9 - 10.3 mg/dL 9.0  9.7        Physical Exam: Lower Extremity Exam DP pulse 1+ palpable PT nonpalpable on right foot.  Difficult to assess due to edema which is 2+ of the foot and ankle.  Ulceration plantar aspect of the right hallux distally that probes to subcutaneous fat tissue with necrotic tissue present in the wound bed mild malodor.  Necrotic appearance of the distal tuft of the hallux.  Sensation absent to digit level  Significant edema no prior amputation of the right foot      ASSESSMENT/PLAN OF CARE 68 y.o. male with PMHx significant for hypertension, hyperlipidemia, type 2 diabetes mellitus, CKD 3A, and right great toe ulcer  with ulceration of the right hallux distal plantar aspect with concern for underlying osteomyelitis of the distal phalanx.  WBC 12.8 from 13.8 CRP 24.7 ESR 69 A1c 6.2  MRI right foot without contrast: Marrow edema and erosive changes with cortical destruction of the distal phalanx concerning for osteomyelitis acute  -  NPO for OR tomorrow morning for right hallux amputation.  Discussed with patient he agrees to proceed. -Check ABI PVR studies this afternoon, discussed with vascular ultrasound lab.  If significant abnormality on the studies patient would need vascular consult and my surgery would be delayed likely until Friday.  Will follow-up. - Continue IV abx broad spectrum pending further culture data - Anticoagulation: Okay to continue per primary - Wound care: Betadine gauze dressing to right hallux preop - WB status: Weightbearing as tolerated, will be in postop shoe following surgery and weightbearing as tolerated - Will continue to follow   Thank you for the consult.  Please contact me directly with any questions or concerns.           Maridee Shoemaker, DPM [facility]    [address] [place], [place] [address]                Office [phone]  Fax [phone]

## 2024-06-15 NOTE — Progress Notes (Signed)
 Pt in [place] MEWS, made [place], [place] aware.

## 2024-06-15 NOTE — Progress Notes (Signed)
 Initial Nutrition Assessment  DOCUMENTATION CODES:   Not applicable  INTERVENTION:   Liberalize diet from HH/CHO mod to promote PO intake Ensure Plus High Protein po BID, each supplement provides 350 kcal and 20 grams of protein 1 packet Juven BID, each packet provides 95 calories, 2.5 grams of protein to support wound healing [place] yogurt to all trays  MVI with minerals daily  Provided high calorie, high protein nutrition education to pt and provided handouts   NUTRITION DIAGNOSIS:   Increased nutrient needs related to post-op healing as evidenced by estimated needs.  GOAL:   Patient will meet greater than or equal to 90% of their needs  MONITOR:   PO intake, Supplement acceptance, Skin, Labs  REASON FOR ASSESSMENT:   Consult Wound healing  ASSESSMENT:  68 y.o. male with PMH of HTN, T2DM, CKD 3A, right great toe ulcer. hypertension, hyperlipidemia, type 2 diabetes mellitus, CKD 3A, and right great toe ulcer who presents with increasing edema, odor, and erythema surrounding the right toe as well as general malaise and nausea. With planned amputation of  right great toe 06/16/2024.  6/18: Planned Right great toe amputation  Pt sitting up in bed. Reports he lives with his wife who cooks all his meals. Eats 3 meals per day with little snacking in between . Pt states he has been intentionally losing weight since the beginning of the year by doing water aerobics and running. Has intentionally lost 11 lbs, 4.5% in 6 months. Suspect pt has lost even more weight due to pt having edema in right calf.   Has had a good appetite until yesterday. Now reports he has no appetite. This morning only had 25%of french toast, eggs, and potatoes for breakfast. Reports some nausea in the past couple of days but no vomiting.Reports he was still able to walk even with inflamed right foot.    RD provided High-Calorie High-Protein Nutrition Therapy handout from the Academy of [employer] Diet[employer].  Reviewed patient's dietary recall. Provided examples on ways to increase caloric density and protein of foods and beverages frequently consumed by the patient. Also provided ideas to promote variety and to incorporate additional nutrient dense foods into patient's diet. Discussed eating small frequent meals and snacks to assist in increasing overall po intake.   Pt agreeable to ONS and wanted greek yogurt added to all meal trays.   Dietary recall: Breakfast: Grits with bacon, eggs or greek yogurt with french toast Lunch: Foot long subway sandwich with grilled chicken, lettuce, tomato, mayo Dinner: Chicken or fish with greens or beans Drinks: Diet Mountain dew and water   Admit weight: 104.8 kg Current weight: 104.8 kg   Average Meal Intake: 6/17: 60% intake x 1 recorded meals  Nutritionally Relevant Medications: Scheduled Meds:  allopurinol   300 mg Oral Daily   atorvastatin   40 mg Oral QHS   enoxaparin  (LOVENOX ) injection  40 mg Subcutaneous Q24H   feeding supplement  237 mL Oral BID BM   gabapentin  300 mg Oral BID   insulin  aspart  0-5 Units Subcutaneous QHS   insulin  aspart  0-6 Units Subcutaneous TID WC   multivitamin with minerals  1 tablet Oral Daily   nutrition supplement (JUVEN)  1 packet Oral BID BM   Continuous Infusions:  ampicillin-sulbactam (UNASYN) IV 3 g (06/15/24 1122)   linezolid (ZYVOX) IV 600 mg (06/15/24 0627)   Labs Reviewed: Potassium 3.3 Chloride 97 Total bilirubin 1.3 Prealbumin 13 CBG ranges from 127-128 mg/dL over the last 24 hours  HgbA1c 6.2   NUTRITION - FOCUSED PHYSICAL EXAM:  Flowsheet Row Most Recent Value  Orbital Region No depletion  Upper Arm Region No depletion  Thoracic and Lumbar Region No depletion  Buccal Region No depletion  Temple Region Mild depletion  Clavicle Bone Region Moderate depletion  Clavicle and Acromion Bone Region No depletion  Scapular Bone Region No depletion  Dorsal Hand No depletion  Patellar Region No  depletion  Anterior Thigh Region No depletion  Posterior Calf Region No depletion  Edema (RD Assessment) Mild  [R calf]  Hair Reviewed  Eyes Reviewed  Mouth Reviewed  Skin Reviewed  Nails Reviewed    Diet Order:   Diet Order             Diet NPO time specified  Diet effective midnight           Diet Carb Modified Fluid consistency: Thin; Room service appropriate? Yes  Diet effective now                   EDUCATION NEEDS:   Education needs have been addressed  Skin:  Skin Assessment: Skin Integrity Issues: Skin Integrity Issues:: Diabetic Ulcer Diabetic Ulcer: R Great toe  Last BM:  PTA  Height:   Ht Readings from Last 1 Encounters:  06/14/24 5' 9 (1.753 m)    Weight:   Wt Readings from Last 1 Encounters:  06/14/24 104.8 kg    Ideal Body Weight:  72.7 kg  BMI:  Body mass index is 34.11 kg/m.  Estimated Nutritional Needs:   Kcal:  2200-2400 kcal  Protein:  130-150 gm  Fluid:  >2L/day   Frederik Jansky, RD Registered Dietitian  See Amion for more information

## 2024-06-15 NOTE — Plan of Care (Signed)
  Problem: Education: Goal: Ability to describe self-care measures that may prevent or decrease complications (Diabetes Survival Skills Education) will improve Outcome: Progressing   Problem: Coping: Goal: Ability to adjust to condition or change in health will improve Outcome: Progressing   Problem: Fluid Volume: Goal: Ability to maintain a balanced intake and output will improve Outcome: Progressing   Problem: Health Behavior/Discharge Planning: Goal: Ability to identify and utilize available resources and services will improve Outcome: Progressing   Problem: Metabolic: Goal: Ability to maintain appropriate glucose levels will improve Outcome: Progressing   Problem: Nutritional: Goal: Maintenance of adequate nutrition will improve Outcome: Progressing Goal: Progress toward achieving an optimal weight will improve Outcome: Progressing

## 2024-06-15 NOTE — Progress Notes (Signed)
 Pt transported to vascular via bed by transportation staff

## 2024-06-16 ENCOUNTER — Encounter (HOSPITAL_COMMUNITY)
Admission: EM | Disposition: A | Discharge status: Home / Self Care | Payer: Self-pay | Source: Home / Self Care | Attending: Internal Medicine

## 2024-06-16 ENCOUNTER — Other Ambulatory Visit: Payer: Self-pay

## 2024-06-16 ENCOUNTER — Inpatient Hospital Stay (HOSPITAL_COMMUNITY)

## 2024-06-16 ENCOUNTER — Telehealth: Payer: Self-pay | Admitting: Podiatry

## 2024-06-16 ENCOUNTER — Encounter (HOSPITAL_COMMUNITY): Payer: Self-pay | Admitting: Family Medicine

## 2024-06-16 ENCOUNTER — Inpatient Hospital Stay (HOSPITAL_COMMUNITY): Admitting: Anesthesiology

## 2024-06-16 DIAGNOSIS — M869 Osteomyelitis, unspecified: Secondary | ICD-10-CM

## 2024-06-16 DIAGNOSIS — L089 Local infection of the skin and subcutaneous tissue, unspecified: Secondary | ICD-10-CM | POA: Diagnosis not present

## 2024-06-16 DIAGNOSIS — I129 Hypertensive chronic kidney disease with stage 1 through stage 4 chronic kidney disease, or unspecified chronic kidney disease: Secondary | ICD-10-CM

## 2024-06-16 DIAGNOSIS — E1122 Type 2 diabetes mellitus with diabetic chronic kidney disease: Secondary | ICD-10-CM | POA: Diagnosis not present

## 2024-06-16 DIAGNOSIS — E1169 Type 2 diabetes mellitus with other specified complication: Secondary | ICD-10-CM

## 2024-06-16 DIAGNOSIS — E876 Hypokalemia: Secondary | ICD-10-CM | POA: Diagnosis not present

## 2024-06-16 DIAGNOSIS — L0390 Cellulitis, unspecified: Secondary | ICD-10-CM | POA: Diagnosis not present

## 2024-06-16 DIAGNOSIS — N1831 Chronic kidney disease, stage 3a: Secondary | ICD-10-CM

## 2024-06-16 HISTORY — PX: AMPUTATION TOE: SHX6595

## 2024-06-16 LAB — BASIC METABOLIC PANEL WITH GFR
Anion gap: 12 (ref 5–15)
BUN: 12 mg/dL (ref 8–23)
CO2: 25 mmol/L (ref 22–32)
Calcium: 9.3 mg/dL (ref 8.9–10.3)
Chloride: 100 mmol/L (ref 98–111)
Creatinine, Ser: 1.14 mg/dL (ref 0.61–1.24)
GFR, Estimated: >60 mL/min (ref >60)
Glucose, Bld: 135 mg/dL — ABNORMAL HIGH (ref 70–99)
Potassium: 3.9 mmol/L (ref 3.5–5.1)
Sodium: 137 mmol/L (ref 135–145)

## 2024-06-16 LAB — CBC
HCT: 35.7 % — ABNORMAL LOW (ref 39.0–52.0)
Hemoglobin: 11.5 g/dL — ABNORMAL LOW (ref 13.0–17.0)
MCH: 28.4 pg (ref 26.0–34.0)
MCHC: 32.2 g/dL (ref 30.0–36.0)
MCV: 88.1 fL (ref 80.0–100.0)
Platelets: 487 10*3/uL — ABNORMAL HIGH (ref 150–400)
RBC: 4.05 MIL/uL — ABNORMAL LOW (ref 4.22–5.81)
RDW: 13.7 % (ref 11.5–15.5)
WBC: 11.9 10*3/uL — ABNORMAL HIGH (ref 4.0–10.5)
nRBC: 0 % (ref 0.0–0.2)

## 2024-06-16 LAB — GLUCOSE, CAPILLARY
Glucose-Capillary: 112 mg/dL — ABNORMAL HIGH (ref 70–99)
Glucose-Capillary: 115 mg/dL — ABNORMAL HIGH (ref 70–99)
Glucose-Capillary: 110 mg/dL — ABNORMAL HIGH (ref 70–99)
Glucose-Capillary: 121 mg/dL — ABNORMAL HIGH (ref 70–99)
Glucose-Capillary: 153 mg/dL — ABNORMAL HIGH (ref 70–99)

## 2024-06-16 LAB — SURGICAL PCR SCREEN
MRSA, PCR: NEGATIVE
Staphylococcus aureus: NEGATIVE

## 2024-06-16 SURGERY — AMPUTATION, TOE
Anesthesia: Monitor Anesthesia Care | Site: Toe | Laterality: Right

## 2024-06-16 MED ORDER — BUPIVACAINE HCL (PF) 0.5 % IJ SOLN
INTRAMUSCULAR | Status: DC | PRN
  Administered 2024-06-16: 5 mL

## 2024-06-16 MED ORDER — LIDOCAINE HCL 1 % IJ SOLN
INTRAMUSCULAR | Status: AC
  Filled 2024-06-16: qty 20

## 2024-06-16 MED ORDER — AMOXICILLIN-POT CLAVULANATE 875-125 MG PO TABS
1.0000 | ORAL_TABLET | Freq: Two times a day (BID) | ORAL | Status: DC
  Administered 2024-06-16 – 2024-06-17 (×2): 1 via ORAL
  Filled 2024-06-16 (×2): qty 1

## 2024-06-16 MED ORDER — ONDANSETRON HCL 4 MG/2ML IJ SOLN
INTRAMUSCULAR | Status: DC | PRN
  Administered 2024-06-16: 4 mg via INTRAVENOUS

## 2024-06-16 MED ORDER — BUPIVACAINE HCL (PF) 0.5 % IJ SOLN
INTRAMUSCULAR | Status: AC
  Filled 2024-06-16: qty 10

## 2024-06-16 MED ORDER — LACTATED RINGERS IV SOLN: INTRAVENOUS | Status: DC

## 2024-06-16 MED ORDER — LACTATED RINGERS IV SOLN: INTRAVENOUS | Status: DC | PRN

## 2024-06-16 MED ORDER — OXYCODONE HCL 5 MG PO TABS: 5.0000 mg | ORAL_TABLET | Freq: Once | ORAL | Status: DC | PRN

## 2024-06-16 MED ORDER — HYDROMORPHONE HCL 1 MG/ML IJ SOLN: 0.2500 mg | INTRAMUSCULAR | Status: DC | PRN

## 2024-06-16 MED ORDER — OXYCODONE HCL 5 MG/5ML PO SOLN: 5.0000 mg | Freq: Once | ORAL | Status: DC | PRN

## 2024-06-16 MED ORDER — SODIUM CHLORIDE 0.9 % IR SOLN
Status: DC | PRN
  Administered 2024-06-16: 1

## 2024-06-16 MED ORDER — CHLORHEXIDINE GLUCONATE 0.12 % MT SOLN
OROMUCOSAL | Status: AC
  Administered 2024-06-16: 15 mL via OROMUCOSAL
  Filled 2024-06-16: qty 15

## 2024-06-16 MED ORDER — PROPOFOL 500 MG/50ML IV EMUL
INTRAVENOUS | Status: DC | PRN
  Administered 2024-06-16: 120 ug/kg/min via INTRAVENOUS

## 2024-06-16 MED ORDER — ORAL CARE MOUTH RINSE: 15.0000 mL | Freq: Once | OROMUCOSAL | Status: AC

## 2024-06-16 MED ORDER — FENTANYL CITRATE (PF) 250 MCG/5ML IJ SOLN
INTRAMUSCULAR | Status: DC | PRN
Start: 2024-06-16 — End: 2024-06-16
  Administered 2024-06-16: 50 ug via INTRAVENOUS
  Administered 2024-06-16: 100 ug via INTRAVENOUS
  Administered 2024-06-16 (×2): 50 ug via INTRAVENOUS

## 2024-06-16 MED ORDER — LIDOCAINE HCL (PF) 1 % IJ SOLN
INTRAMUSCULAR | Status: DC | PRN
Start: 2024-06-16 — End: 2024-06-16
  Administered 2024-06-16: 5 mL

## 2024-06-16 MED ORDER — AMISULPRIDE (ANTIEMETIC) 5 MG/2ML IV SOLN: 10.0000 mg | Freq: Once | INTRAVENOUS | Status: DC | PRN

## 2024-06-16 MED ORDER — FENTANYL CITRATE (PF) 250 MCG/5ML IJ SOLN
INTRAMUSCULAR | Status: AC
  Filled 2024-06-16: qty 5

## 2024-06-16 MED ORDER — CHLORHEXIDINE GLUCONATE 0.12 % MT SOLN
15.0000 mL | Freq: Once | OROMUCOSAL | Status: AC
  Filled 2024-06-16: qty 15

## 2024-06-16 MED ORDER — SODIUM CHLORIDE 0.9 % IV SOLN: 12.5000 mg | INTRAVENOUS | Status: DC | PRN

## 2024-06-16 MED ORDER — PROPOFOL 10 MG/ML IV BOLUS
INTRAVENOUS | Status: DC | PRN
  Administered 2024-06-16: 50 mg via INTRAVENOUS

## 2024-06-16 SURGICAL SUPPLY — 38 items
BLADE AVERAGE 25X9 (BLADE) IMPLANT
BLADE LONG MED 31X9 (MISCELLANEOUS) IMPLANT
BLADE SURG 10 STRL SS (BLADE) ×1 IMPLANT
BLADE SURG 15 STRL LF DISP TIS (BLADE) ×1 IMPLANT
BNDG COHESIVE 3X5 TAN ST LF (GAUZE/BANDAGES/DRESSINGS) ×1 IMPLANT
BNDG COMPR ESMARK 4X3 LF (GAUZE/BANDAGES/DRESSINGS) ×1 IMPLANT
BNDG ELASTIC 3INX 5YD STR LF (GAUZE/BANDAGES/DRESSINGS) ×1 IMPLANT
BNDG ELASTIC 4INX 5YD STR LF (GAUZE/BANDAGES/DRESSINGS) IMPLANT
BNDG GAUZE DERMACEA FLUFF 4 (GAUZE/BANDAGES/DRESSINGS) IMPLANT
BNDG STRETCH 4X75 STRL LF (GAUZE/BANDAGES/DRESSINGS) IMPLANT
CHLORAPREP W/TINT 26 (MISCELLANEOUS) IMPLANT
DRSG ADAPTIC 3X8 NADH LF (GAUZE/BANDAGES/DRESSINGS) IMPLANT
DRSG XEROFORM 1X8 (GAUZE/BANDAGES/DRESSINGS) IMPLANT
ELECTRODE REM PT RTRN 9FT ADLT (ELECTROSURGICAL) ×1 IMPLANT
GAUZE PAD ABD 8X10 STRL (GAUZE/BANDAGES/DRESSINGS) IMPLANT
GAUZE SPONGE 2X2 STRL 8-PLY (GAUZE/BANDAGES/DRESSINGS) IMPLANT
GAUZE SPONGE 4X4 12PLY STRL (GAUZE/BANDAGES/DRESSINGS) ×1 IMPLANT
GAUZE STRETCH 2X75IN STRL (MISCELLANEOUS) ×1 IMPLANT
GAUZE XEROFORM 1X8 LF (GAUZE/BANDAGES/DRESSINGS) ×1 IMPLANT
GLOVE BIO SURGEON STRL SZ7.5 (GLOVE) ×1 IMPLANT
GLOVE BIOGEL PI IND STRL 7.5 (GLOVE) ×1 IMPLANT
GOWN STRL REUS W/ TWL LRG LVL3 (GOWN DISPOSABLE) ×2 IMPLANT
KIT BASIN OR (CUSTOM PROCEDURE TRAY) ×1 IMPLANT
NDL HYPO 25X1 1.5 SAFETY (NEEDLE) ×1 IMPLANT
NEEDLE HYPO 25X1 1.5 SAFETY (NEEDLE) ×1 IMPLANT
PACK ORTHO EXTREMITY (CUSTOM PROCEDURE TRAY) ×1 IMPLANT
PADDING CAST ABS COTTON 4X4 ST (CAST SUPPLIES) ×2 IMPLANT
SET HNDPC FAN SPRY TIP SCT (DISPOSABLE) IMPLANT
SPIKE FLUID TRANSFER (MISCELLANEOUS) IMPLANT
STOCKINETTE 4X48 STRL (DRAPES) IMPLANT
SUT ETHILON 3 0 FSLX (SUTURE) IMPLANT
SUT PROLENE 3 0 PS 2 (SUTURE) IMPLANT
SUT PROLENE 4 0 PS 2 18 (SUTURE) IMPLANT
SYR CONTROL 10ML LL (SYRINGE) ×1 IMPLANT
TUBE CONNECTING 12X1/4 (SUCTIONS) IMPLANT
UNDERPAD 30X36 HEAVY ABSORB (UNDERPADS AND DIAPERS) ×1 IMPLANT
WATER STERILE IRR 1000ML POUR (IV SOLUTION) ×1 IMPLANT
YANKAUER SUCT BULB TIP NO VENT (SUCTIONS) IMPLANT

## 2024-06-16 NOTE — Op Note (Signed)
 Full Operative Report  Date of Operation: 9:49 AM, 06/16/2024   Patient: Carolynne Citron - 68 y.o. male  Surgeon: Evertt Hoe, DPM   Assistant: None  Diagnosis: Osteomyelitis right great toe  Procedure:  1.  Amputation right great toe at proximal phalanx level, right foot    Anesthesia: Monitor Anesthesia Care  No responsible provider has been recorded for the case.  Anesthesiologist: Earvin Goldberg, MD CRNA: Loreda Rodriguez, CRNA   Estimated Blood Loss: Minimal   Hemostasis: 1) Anatomical dissection, mechanical compression, electrocautery 2) no tourniquet was used during procedure  Implants: * No implants in log *  Materials: Prolene 3-0  Injectables: 1) Pre-operatively: 10 cc of 50:50 mixture 1%lidocaine  plain and 0.5% marcaine  plain 2) Post-operatively: None   Specimens: - Pathology: Right great toe for pathology - Microbiology: Bone culture distal phalanx for micro   Antibiotics: IV antibiotics given per schedule on the floor  Drains: None  Complications: Patient tolerated the procedure well without complication.   Operative findings: As below in detailed report  Indications for Procedure: [facility] presents to Evertt Hoe, [place] with a chief complaint of chronic ulceration distal aspect right hallux with concern for underlying osteomyelitis of the distal phalanx.  The patient has failed conservative treatments of various modalities. At this time the patient has elected to proceed with surgical correction. All alternatives, risks, and complications of the procedures were thoroughly explained to the patient. Patient exhibits appropriate understanding of all discussion points and informed consent was signed and obtained in the chart with no guarantees to surgical outcome given or implied.  Description of Procedure: Patient was brought to the operating room. Patient remained on their hospital bed in the supine position. A surgical  timeout was performed and all members of the operating room, the procedure, and the surgical site were identified. anesthesia occurred as per anesthesia record. Local anesthetic as previously described was then injected about the operative field in a local infiltrative block.  The operative lower extremity as noted above was then prepped and draped in the usual sterile manner. The following procedure then began.  Attention was directed to the FIRST digit on the RIGHT foot. A full-thickness incision encompassing the entire digit was made using a #15 blade. Dissection was carried down to bone. The toe was secured with a towel clamp, further dissected in its entirety, and disarticulated at the IPJ and passed to the back table as a gross specimen. This was then labled and sent to pathology. The bone was noted to be soft and eroded, and consistent with osteomyelitis.  A bone culture was harvested with rongeur and sent for micro.  All remaining necrotic and devitalized soft tissue structures were visualized and dissected away using sharp and dull dissection.  Then using a sagittal saw transverse osteotomy was made at the mid proximal phalanx level for closure purposes.  Care was taken to protect all neurovascular structures throughout the dissection. All bleeders were cauterized as necessary. The area was then flushed with copious amounts of sterile saline. Then using the suture materials previously described, the site was closed in anatomic layers and the skin was well approximated under minimal tension.   The surgical site was then dressed with Xeroform 4 x 4 gauze Kerlix Ace. The patient tolerated both the procedure and anesthesia well with vital signs stable throughout. The patient was transferred in good condition and all vital signs stable  from the OR to recovery under the discretion of anesthesia.  Condition: Vital  signs stable, neurovascular status unchanged from preoperative   Surgical plan:  Expect  clean margin, rec 5 days po abx Augmentin  ok. Decent bleeding intra op should have enough arterial flow for healing. Dressings remain C/D/I until follow up next week.   The patient will be weightbearing as tolerated in a postop shoe to the operative limb until further instructed. The dressing is to remain clean, dry, and intact. Will continue to follow unless noted elsewhere.   Russ Course, DPM [facility]

## 2024-06-16 NOTE — Progress Notes (Signed)
   06/16/24 1336  TOC Brief Assessment  Insurance and Status Reviewed  Patient has primary care physician Yes  Home environment has been reviewed spouse  Prior level of function: independent  Prior/Current Home Services No current home services  Social Drivers of Health Review SDOH reviewed no interventions necessary     Patient for surgery.  Await post op PT/OT recommendations and plan of care    Transition of Care Department [facility]) has reviewed patient and  will continue to monitor patient advancement through interdisciplinary progression rounds. If new patient transition needs arise, please place a TOC consult.

## 2024-06-16 NOTE — Progress Notes (Addendum)
 Wound was cleaned with sterile water and dried. Placed gauze, xeroform dressing and Kerlix on the foot. Pt tolerated this well.

## 2024-06-16 NOTE — Progress Notes (Signed)
 Orthopedic Tech Progress Note Patient Details:  Calvin Shields November 28, 1956 [id number]  Ortho Devices Type of Ortho Device: Postop shoe/boot Ortho Device/Splint Location: RLE Ortho Device/Splint Interventions: Ordered, Application, Adjustment, Removal   Post Interventions Patient Tolerated: Well Instructions Provided: Care of device  Kermitt Pedlar 06/16/2024, 11:33 AM

## 2024-06-16 NOTE — Telephone Encounter (Signed)
 pt lft mess on my vmail needing my fax for his FMLA forms to be sent to me. I called and lft my fax# on his vmail for them to be faxed to me

## 2024-06-16 NOTE — Progress Notes (Signed)
 PROGRESS NOTE  Calvin Shields:[id number] DOB: [birth year]   PCP: Leesa Pulling, MD  Patient is from: Home.  DOA: 06/14/2024 LOS: 2  Brief Narrative / Interim history: 68 year old M with PMH of DM-2, CKD-3A, HTN, HLD and right great toe ulcer followed by podiatry sent to ED by his podiatrist for right great toe ulcer evaluation after he presented with progressive swelling, redness, drainage and odor from right great toe ulcer for about 7 days. In ED, febrile, tachycardic with leukocytosis meeting sepsis criteria.  Lactic acid within normal.  MRI right foot suggested cellulitis and osteomyelitis.   Subjective: Patient denies any significant pain in the right foot.  No chest pain shortness of breath.  No nausea or vomiting.    Objective: Vitals:   06/16/24 0010 06/16/24 0546 06/16/24 0726 06/16/24 0902  BP: 139/76 137/60 120/76 135/73  Pulse: 95 94 93 93  Resp: 16 16 16 18   Temp: 99.6 F (37.6 C) 98.9 F (37.2 C) 98.8 F (37.1 C) 97.8 F (36.6 C)  TempSrc: Oral Oral Oral Oral  SpO2: 96% 98% 99% 96%  Weight:    105.2 kg  Height:    5' 9 (1.753 m)    Examination:  General appearance: Awake alert.  In no distress Resp: Clear to auscultation bilaterally.  Normal effort Cardio: S1-S2 is normal regular.  No S3-S4.  No rubs murmurs or bruit GI: Abdomen is soft.  Nontender nondistended.  Bowel sounds are present normal.  No masses organomegaly Extremities: Right foot is covered in dressing Neurologic: Alert and oriented x3.  No focal neurological deficits.     Consultants:  Podiatric  Procedures: Right first toe amputation is planned for today  Microbiology summarized: Blood cultures NGTD  Assessment and plan:  Right first toe infection/osteomyelitis/sepsis, present on admission  Had fever, leukocytosis and tachycardia on presentation.  No signs of endorgan damage.  CRP elevated to 25.  MRI showing cellulitis/osteomyelitis of right great toe.   Pulses were  difficult to palpate so patient underwent ABIs which were unremarkable. Podiatry plans to do amputation of the first toe today. Patient is on broad-spectrum antibiotics with the linezolid and Unasyn.  NIDDM-2 with CKD-3A, diabetic foot ulcer, HLD, neuropathy and hyperglycemia A1c 6.2%.  On Trulicity  at home. -Continue current insulin  regimen-very sensitive SSI -Continue Lipitor and gabapentin - Hold home Trulicity  Monitor CBGs.  CKD-3A Stable.  Monitor urine output.  Avoid nephrotoxic agents.  Essential hypertension Currently off of antihypertensives.  Blood pressure is reasonably well-controlled.    Hypokalemia Supplemented yesterday.  Await labs from today.  GERD - Continue home Pepcid   Class I obesity Body mass index is 34.26 kg/m. Nutrition Problem: Increased nutrient needs Etiology: post-op healing Signs/Symptoms: estimated needs Interventions: Refer to RD note for recommendations   DVT prophylaxis: enoxaparin  (LOVENOX ) injection 40 mg Start: 06/14/24 1945 Code Status: Full code Family Communication: None at bedside Disposition: Hopefully return home when cleared by podiatry.   Sch Meds:  Scheduled Meds:  [MAR Hold] allopurinol   300 mg Oral Daily   [MAR Hold] atorvastatin   40 mg Oral QHS   [MAR Hold] enoxaparin  (LOVENOX ) injection  40 mg Subcutaneous Q24H   [MAR Hold] feeding supplement  237 mL Oral BID BM   [MAR Hold] gabapentin  300 mg Oral BID   [MAR Hold] insulin  aspart  0-5 Units Subcutaneous QHS   [MAR Hold] insulin  aspart  0-6 Units Subcutaneous TID WC   [MAR Hold] multivitamin with minerals  1 tablet Oral Daily   [  MAR Hold] nutrition supplement (JUVEN)  1 packet Oral BID BM   Continuous Infusions:  [MAR Hold] ampicillin-sulbactam (UNASYN) IV Stopped (06/16/24 0354)   lactated ringers      [MAR Hold] linezolid (ZYVOX) IV Stopped (06/16/24 0106)   PRN Meds:.[MAR Hold] acetaminophen  **OR** [MAR Hold] acetaminophen , [MAR Hold] famotidine, [MAR Hold]  fentaNYL  (SUBLIMAZE ) injection, [MAR Hold] hydrALAZINE, [MAR Hold] oxyCODONE , [MAR Hold] polyethylene glycol, [MAR Hold] prochlorperazine   Antimicrobials: Anti-infectives (From admission, onward)    Start     Dose/Rate Route Frequency Ordered Stop   06/15/24 0630  [MAR Hold]  linezolid (ZYVOX) IVPB 600 mg        (MAR Hold since Wed 06/16/2024 at 0857.Hold Reason: Transfer to a Procedural area)  Placed in And Linked Group   600 mg 300 mL/hr over 60 Minutes Intravenous Every 12 hours 06/14/24 1933 06/22/24 0959   06/15/24 0000  [MAR Hold]  Ampicillin-Sulbactam (UNASYN) 3 g in sodium chloride  0.9 % 100 mL IVPB        (MAR Hold since Wed 06/16/2024 at 0857.Hold Reason: Transfer to a Procedural area)   3 g 200 mL/hr over 30 Minutes Intravenous Every 6 hours 06/14/24 1937     06/14/24 1630  Ampicillin-Sulbactam (UNASYN) 3 g in sodium chloride  0.9 % 100 mL IVPB       Placed in And Linked Group   3 g 200 mL/hr over 30 Minutes Intravenous  Once 06/14/24 1616 06/14/24 1832   06/14/24 1630  linezolid (ZYVOX) IVPB 600 mg       Placed in And Linked Group   600 mg 300 mL/hr over 60 Minutes Intravenous  Once 06/14/24 1616 06/14/24 1932        Lab data: CBC: Recent Labs  Lab 06/14/24 1645 06/15/24 0635 06/16/24 0829  WBC 13.8* 12.8* 11.9*  NEUTROABS 10.6*  --   --   HGB 11.6* 10.7* 11.5*  HCT 35.8* 32.2* 35.7*  MCV 89.1 87.0 88.1  PLT 461* 467* 487*   BMP &GFR Recent Labs  Lab 06/14/24 1645 06/15/24 0635  NA 136 135  K 3.3* 3.3*  CL 93* 97*  CO2 28 25  GLUCOSE 127* 128*  BUN 14 12  CREATININE 1.55* 1.20  CALCIUM  9.7 9.0  MG  --  1.7   Estimated Creatinine Clearance: 69.4 mL/min (by C-G formula based on SCr of 1.2 mg/dL). Liver & Pancreas: Recent Labs  Lab 06/14/24 1645  AST 23  ALT 25  ALKPHOS 119  BILITOT 1.3*  PROT 8.1  ALBUMIN  3.5    Diabetic: Recent Labs    06/15/24 0630  HGBA1C 6.2*   Recent Labs  Lab 06/15/24 0819 06/15/24 1126 06/15/24 1519  06/15/24 2013 06/16/24 0816  GLUCAP 184* 128* 149* 135* 112*     Microbiology: Recent Results (from the past 240 hours)  Blood Cultures x 2 sites     Status: None (Preliminary result)   Collection Time: 06/14/24  4:45 PM   Specimen: BLOOD  Result Value Ref Range Status   Specimen Description BLOOD RIGHT ANTECUBITAL  Final   Special Requests   Final    BOTTLES DRAWN AEROBIC AND ANAEROBIC Blood Culture results may not be optimal due to an inadequate volume of blood received in culture bottles   Culture   Final    NO GROWTH 2 DAYS Performed at [facility], [address] [place], [place] [address]    Report Status PENDING  Incomplete  Blood Cultures x 2 sites  Status: None (Preliminary result)   Collection Time: 06/14/24  4:51 PM   Specimen: BLOOD LEFT HAND  Result Value Ref Range Status   Specimen Description BLOOD LEFT HAND  Final   Special Requests   Final    BOTTLES DRAWN AEROBIC AND ANAEROBIC Blood Culture results may not be optimal due to an inadequate volume of blood received in culture bottles   Culture   Final    NO GROWTH 2 DAYS Performed at [facility], [address], [place], [place] [address]    Report Status PENDING  Incomplete  Surgical pcr screen     Status: None   Collection Time: 06/16/24  3:30 AM   Specimen: Nasal Mucosa; Nasal Swab  Result Value Ref Range Status   MRSA, PCR NEGATIVE NEGATIVE Final   Staphylococcus aureus NEGATIVE NEGATIVE Final    Comment: (NOTE) The Xpert SA Assay (FDA approved for NASAL specimens in patients 83 years of age and older), is one component of a comprehensive surveillance program. It is not intended to diagnose infection nor to guide or monitor treatment. Performed at [facility], [address] [place], [place] [address]     Radiology Studies: VAS US  ABI WITH/WO TBI Result Date: 06/15/2024  LOWER EXTREMITY DOPPLER STUDY Patient Name:  Nayden Czajka  Date of Exam:   06/15/2024 Medical Rec  #: [id number]         Accession #:    [id number] Date of Birth: 24-Mar-1956         Patient Gender: M Patient Age:   81 years Exam Location:  [facility] Procedure:      VAS US  ABI WITH/WO TBI Referring Phys: TIMOTHY OPYD --------------------------------------------------------------------------------  Indications: Ulceration. High Risk Factors: Hypertension, hyperlipidemia, Diabetes, no history of                    smoking. Other Factors: CKD, s/p great tow amputation.  Comparison Study: No previous exams Performing Technologist: Hill, Jody RVT, RDMS  Examination Guidelines: A complete evaluation includes at minimum, Doppler waveform signals and systolic blood pressure reading at the level of bilateral brachial, anterior tibial, and posterior tibial arteries, when vessel segments are accessible. Bilateral testing is considered an integral part of a complete examination. Photoelectric Plethysmograph (PPG) waveforms and toe systolic pressure readings are included as required and additional duplex testing as needed. Limited examinations for reoccurring indications may be performed as noted.  ABI Findings: +---------+------------------+-----+---------+----------+ Right    Rt Pressure (mmHg)IndexWaveform Comment    +---------+------------------+-----+---------+----------+ Brachial 171                    triphasic           +---------+------------------+-----+---------+----------+ PTA      179               1.05 triphasic           +---------+------------------+-----+---------+----------+ DP       170               0.99 triphasic           +---------+------------------+-----+---------+----------+ Great Toe                       Abnormal open wound +---------+------------------+-----+---------+----------+ +---------+------------------+-----+---------+----------+ Left     Lt Pressure (mmHg)IndexWaveform Comment    +---------+------------------+-----+---------+----------+ Brachial  168  triphasic           +---------+------------------+-----+---------+----------+ PTA      175               1.02 triphasic           +---------+------------------+-----+---------+----------+ DP       151               0.88 biphasic            +---------+------------------+-----+---------+----------+ Great Toe                                amputation +---------+------------------+-----+---------+----------+  Summary: Right: Resting right ankle-brachial index is within normal range. Unable to obtain TBI due to open wound. Left: Resting left ankle-brachial index is within normal range. Great toe amputation *See table(s) above for measurements and observations.  Electronically signed by Runell Countryman on 06/15/2024 at 4:31:45 PM.    Final      Maylene Spear  [facility]  If 7PM-7AM, please contact night-coverage [email or web address] 06/16/2024, 9:18 AM

## 2024-06-16 NOTE — Anesthesia Preprocedure Evaluation (Signed)
 Anesthesia Evaluation  Patient identified by MRN, date of birth, ID band Patient awake    Reviewed: Allergy & Precautions, H&P , NPO status , Patient's Chart, lab work & pertinent test results, reviewed documented beta blocker date and time   Airway Mallampati: II  TM Distance: >3 FB Neck ROM: Full    Dental  (+) Dental Advisory Given, Poor Dentition, Chipped, Missing   Pulmonary neg pulmonary ROS   Pulmonary exam normal breath sounds clear to auscultation       Cardiovascular Exercise Tolerance: Good hypertension, Pt. on medications Normal cardiovascular exam Rhythm:Regular Rate:Normal     Neuro/Psych negative neurological ROS  negative psych ROS   GI/Hepatic Neg liver ROS,GERD  Controlled and Medicated,,  Endo/Other  negative endocrine ROSdiabetes, Type 2    Renal/GU Renal InsufficiencyRenal disease     Musculoskeletal  (+) Arthritis ,    Abdominal  (+) + obese  Peds  Hematology  (+) Blood dyscrasia, anemia   Anesthesia Other Findings   Reproductive/Obstetrics negative OB ROS                             Anesthesia Physical Anesthesia Plan  ASA: 3  Anesthesia Plan: MAC   Post-op Pain Management: Gabapentin PO (pre-op)*   Induction: Intravenous  PONV Risk Score and Plan: 2 and Ondansetron , Treatment may vary due to age or medical condition and Propofol  infusion  Airway Management Planned: Simple Face Mask  Additional Equipment:   Intra-op Plan:   Post-operative Plan:   Informed Consent: I have reviewed the patients History and Physical, chart, labs and discussed the procedure including the risks, benefits and alternatives for the proposed anesthesia with the patient or authorized representative who has indicated his/her understanding and acceptance.     Dental advisory given  Plan Discussed with: CRNA  Anesthesia Plan Comments: (PAT note by Rudy Costain,  PA-C: 68 year old male with pertinent history including HTN, non-insulin -dependent DM2 (A1c 6.9 on 11/24/23), HLD.  Patient was admitted in July 2023 at [facility] due to episode of syncope. Echocardiogram showed a normal ejection fraction with no other abnormalities other than mild pulmonary hypertension. Carotid Doppler exam was normal.  Episode was felt secondary to heat syncope (reportedly was working in in the room between 100 to 110 degrees prior to syncopal).  Last dose of Trulicity  01/13/2024.  Patient will need day of surgery labs and evaluation.  03/26/2023: Normal sinus rhythm.  Rate 86.  T wave abnormality, consider inferior ischemia. When compared with ECG of 03-Sep-2017 07:16, No significant change since last tracing  MRI L-spine 12/16/2023 (Care Everywhere): 1. Severe L5-S1 spinal canal stenosis with moderate right and severe  left neural foraminal stenosis.  2. Moderate bilateral L4-L5 neural foraminal stenosis.  3. Focus of low T1-weighted signal dorsal to the L5-S1 disc that  could be a sequestered disc fragment.   TTE 07/24/2022 (Care Everywhere): Summary  1. The left ventricle is normal in size with mildly increased wall  thickness.  2. The left ventricular systolic function is normal, LVEF is visually  estimated at > 55%.  3. The left atrium is mildly dilated in size.  4. The right ventricle is normal in size, with normal systolic function.  5. There is mild pulmonary hypertension.   Carotid duplex 07/24/2022 (Care Everywhere): Color duplex indicates minimal heterogeneous plaque, with no  hemodynamically significant stenosis by duplex criteria in the  extracranial cerebrovascular circulation.    )  Anesthesia Quick Evaluation

## 2024-06-16 NOTE — Anesthesia Postprocedure Evaluation (Signed)
 Anesthesia Post Note  Patient: Calvin Shields  Procedure(s) Performed: AMPUTATION, TOE (Right: Toe)     Patient location during evaluation: PACU Anesthesia Type: MAC Level of consciousness: awake and alert Pain management: pain level controlled Vital Signs Assessment: post-procedure vital signs reviewed and stable Respiratory status: spontaneous breathing, nonlabored ventilation and respiratory function stable Cardiovascular status: blood pressure returned to baseline and stable Postop Assessment: no apparent nausea or vomiting Anesthetic complications: no   There were no known notable events for this encounter.  Last Vitals:  Vitals:   06/16/24 1100 06/16/24 1145  BP: 137/72 137/77  Pulse: 87 84  Resp: 12 16  Temp: 36.6 C 36.8 C  SpO2: 98% 100%    Last Pain:  Vitals:   06/16/24 1145  TempSrc: Oral  PainSc:                  Earvin Goldberg

## 2024-06-16 NOTE — Progress Notes (Signed)
 PT Cancellation Note  Patient Details Name: Calvin Shields MRN: [id number] DOB: 22-Jun-1956   Cancelled Treatment:    Reason Eval/Treat Not Completed: Patient at procedure or test/unavailable (Pt off unit to OR for Rt toe amputation. Will follow up at later date/time as pt able and schedule allows.)   Corbin Dess PT, DPT Acute Rehabilitation Services Office [phone]  06/16/24 10:42 AM

## 2024-06-16 NOTE — Evaluation (Signed)
 Physical Therapy Evaluation Patient Details Name: Calvin Shields MRN: [id number] DOB: 1956/05/29 Today's Date: 06/16/2024  History of Present Illness  The pt is a 68 yo male s/p Rt great toe amp 06/16/24. PMH significant for HTN, DM II, arthritis, and class II obesity (BMI 35), microdiscectomy of L5-S1 on 01/16/24.  Clinical Impression  Calvin Shields is 68 y.o. male admitted with above HPI and diagnosis. Patient is currently limited by functional impairments below (see PT problem list). Patient lives with spouse and is independent with no AD at baseline. Patient currently is mobilizing at Mod ind to supervision level with RW for support. Gait attempted with no AD and mildly unsteady. Requested family bring Lt shoe in for pt to even out gait as pt requires post op shoe on Rt LE for WBAT. Pt amb 200' with no overt LOB and CGA to supervision. Patient will benefit from continued skilled PT interventions to address impairments and progress independence with mobility. Acute PT will follow and progress as able.         If plan is discharge home, recommend the following: Assist for transportation   Can travel by private vehicle        Equipment Recommendations None recommended by PT  Recommendations for Other Services       Functional Status Assessment Patient has had a recent decline in their functional status and demonstrates the ability to make significant improvements in function in a reasonable and predictable amount of time.     Precautions / Restrictions Precautions Precautions: Fall Recall of Precautions/Restrictions: Intact Restrictions Weight Bearing Restrictions Per Provider Order: No      Mobility  Bed Mobility Overal bed mobility: Independent                  Transfers Overall transfer level: Modified independent Equipment used: None               General transfer comment: use of hands    Ambulation/Gait Ambulation/Gait assistance: Contact guard  assist, Supervision Gait Distance (Feet): 200 Feet Assistive device: Rolling walker (2 wheels), None Gait Pattern/deviations: Step-through pattern, Decreased stride length Gait velocity: decr     General Gait Details: overall steady with mild balance impairements suspect secondary to post op shoe and uneven height Rt/Lt Le. pt amb wtih and without RW, balance more steady with RW.  Stairs            Wheelchair Mobility     Tilt Bed    Modified Rankin (Stroke Patients Only)       Balance Overall balance assessment: No apparent balance deficits (not formally assessed)                                           Pertinent Vitals/Pain Pain Assessment Pain Assessment: Faces Faces Pain Scale: Hurts a little bit Pain Location: Rt toe Pain Descriptors / Indicators: Aching, Discomfort Pain Intervention(s): Monitored during session, Limited activity within patient's tolerance, Repositioned, Ice applied    Home Living Family/patient expects to be discharged to:: Private residence Living Arrangements: Spouse/significant other Available Help at Discharge: Family;Available 24 hours/day Type of Home: House Home Access: Stairs to enter Entrance Stairs-Rails: Right Entrance Stairs-Number of Steps: 3   Home Layout: One level Home Equipment: Cane - single point;Rolling Walker (2 wheels);Grab bars - tub/shower;Grab bars - toilet      Prior Function Prior Level of Function :  Independent/Modified Independent             Mobility Comments: no AD       Extremity/Trunk Assessment   Upper Extremity Assessment Upper Extremity Assessment: Overall WFL for tasks assessed    Lower Extremity Assessment Lower Extremity Assessment: Overall WFL for tasks assessed    Cervical / Trunk Assessment Cervical / Trunk Assessment: Normal  Communication   Communication Communication: No apparent difficulties    Cognition Arousal: Alert Behavior During Therapy: WFL  for tasks assessed/performed   PT - Cognitive impairments: No apparent impairments                         Following commands: Intact       Cueing Cueing Techniques: Verbal cues     General Comments      Exercises     Assessment/Plan    PT Assessment Patient needs continued PT services  PT Problem List Decreased activity tolerance;Decreased balance;Decreased knowledge of precautions;Decreased knowledge of use of DME       PT Treatment Interventions DME instruction;Gait training;Stair training;Functional mobility training;Therapeutic activities;Therapeutic exercise;Balance training;Neuromuscular re-education;Cognitive remediation;Patient/family education;Wheelchair mobility training    PT Goals (Current goals can be found in the Care Plan section)  Acute Rehab PT Goals Patient Stated Goal: get home and heal PT Goal Formulation: With patient Time For Goal Achievement: 06/30/24 Potential to Achieve Goals: Good    Frequency Min 1X/week     Co-evaluation               AM-PAC PT 6 Clicks Mobility  Outcome Measure Help needed turning from your back to your side while in a flat bed without using bedrails?: None Help needed moving from lying on your back to sitting on the side of a flat bed without using bedrails?: None Help needed moving to and from a bed to a chair (including a wheelchair)?: A Little Help needed standing up from a chair using your arms (e.g., wheelchair or bedside chair)?: A Little Help needed to walk in hospital room?: A Little Help needed climbing 3-5 steps with a railing? : A Little 6 Click Score: 20    End of Session Equipment Utilized During Treatment: Gait belt Activity Tolerance: Patient tolerated treatment well Patient left: with call bell/phone within reach;in bed;with bed alarm set;with family/visitor present Nurse Communication: Mobility status PT Visit Diagnosis: Other abnormalities of gait and mobility (R26.89)    Time:  8119-1478 PT Time Calculation (min) (ACUTE ONLY): 17 min   Charges:   PT Evaluation $PT Eval Low Complexity: 1 Low   PT General Charges $$ ACUTE PT VISIT: 1 Visit         Tish Forge, DPT Acute Rehabilitation Services Office [phone]  06/16/24 3:21 PM

## 2024-06-16 NOTE — H&P (Signed)
 Anesthesia H&P Update: History and Physical Exam reviewed; patient is OK for planned anesthetic and procedure. ? ?

## 2024-06-16 NOTE — Plan of Care (Signed)
  Problem: Coping: Goal: Ability to adjust to condition or change in health will improve Outcome: Progressing   Problem: Fluid Volume: Goal: Ability to maintain a balanced intake and output will improve Outcome: Progressing   Problem: Metabolic: Goal: Ability to maintain appropriate glucose levels will improve Outcome: Progressing   Problem: Nutritional: Goal: Maintenance of adequate nutrition will improve Outcome: Progressing   Problem: Education: Goal: Knowledge of General Education information will improve Description: Including pain rating scale, medication(s)/side effects and non-pharmacologic comfort measures Outcome: Progressing   Problem: Clinical Measurements: Goal: Will remain free from infection Outcome: Progressing Goal: Diagnostic test results will improve Outcome: Progressing   Problem: Nutrition: Goal: Adequate nutrition will be maintained Outcome: Progressing   Problem: Safety: Goal: Ability to remain free from injury will improve Outcome: Progressing   Problem: Skin Integrity: Goal: Risk for impaired skin integrity will decrease Outcome: Progressing

## 2024-06-16 NOTE — Transfer of Care (Signed)
 Immediate Anesthesia Transfer of Care Note  Patient: [facility]  Procedure(s) Performed: AMPUTATION, TOE (Right: Toe)  Patient Location: PACU  Anesthesia Type:MAC  Level of Consciousness: awake, alert , and oriented  Airway & Oxygen  Therapy: Patient Spontanous Breathing and Patient connected to nasal cannula oxygen   Post-op Assessment: Report given to RN and Post -op Vital signs reviewed and stable  Post vital signs: Reviewed and stable  Last Vitals:  Vitals Value Taken Time  BP 121/98 06/16/24 10:38  Temp 97   Pulse 84 06/16/24 10:40  Resp 12 06/16/24 10:40  SpO2 96 % 06/16/24 10:40  Vitals shown include unfiled device data.  Last Pain:  Vitals:   06/16/24 0915  TempSrc:   PainSc: 0-No pain         Complications: No notable events documented.

## 2024-06-16 NOTE — Plan of Care (Signed)

## 2024-06-16 NOTE — Progress Notes (Signed)
 History and Physical Interval Note:  06/16/2024 9:15 AM  Carolynne Citron  has presented today for surgery, with the diagnosis of osteomyelitis right great toe.  The various methods of treatment have been discussed with the patient and family. After consideration of risks, benefits and other options for treatment, the patient has consented to   Procedure(s) with comments: AMPUTATION, TOE (Right) - Right hallux amputation as a surgical intervention.  The patient's history has been reviewed, patient examined, no change in status, stable for surgery.  I have reviewed the patient's chart and labs.  Questions were answered to the patient's satisfaction.     Karlene Overcast Scarlet Abad

## 2024-06-16 NOTE — Telephone Encounter (Signed)
 pt called again - I called him back and he said he didn't get my message. I gave him my fax# and he adv I should be getting 3 forms and he will p/up in [place](his copy). OOW 06/11/24-08/02/24 approx and will go back no restrictions. will call once done

## 2024-06-16 NOTE — Progress Notes (Signed)
 OT Cancellation Note  Patient Details Name: Calvin Shields MRN: [id number] DOB: 05/10/1956   Cancelled Treatment:    Reason Eval/Treat Not Completed: Patient at procedure or test/ unavailable. Pt off unit at OR, OT will follow up tomorrow as approrpriate  Alfred Ann 06/16/2024, 10:34 AM

## 2024-06-17 ENCOUNTER — Encounter (HOSPITAL_COMMUNITY): Payer: Self-pay | Admitting: Podiatry

## 2024-06-17 DIAGNOSIS — L0390 Cellulitis, unspecified: Secondary | ICD-10-CM | POA: Diagnosis not present

## 2024-06-17 DIAGNOSIS — M869 Osteomyelitis, unspecified: Secondary | ICD-10-CM | POA: Diagnosis not present

## 2024-06-17 DIAGNOSIS — A419 Sepsis, unspecified organism: Secondary | ICD-10-CM | POA: Diagnosis not present

## 2024-06-17 LAB — BASIC METABOLIC PANEL WITH GFR
Anion gap: 12 (ref 5–15)
BUN: 14 mg/dL (ref 8–23)
CO2: 24 mmol/L (ref 22–32)
Calcium: 9.3 mg/dL (ref 8.9–10.3)
Chloride: 103 mmol/L (ref 98–111)
Creatinine, Ser: 1.12 mg/dL (ref 0.61–1.24)
GFR, Estimated: >60 mL/min (ref >60)
Glucose, Bld: 109 mg/dL — ABNORMAL HIGH (ref 70–99)
Potassium: 3.6 mmol/L (ref 3.5–5.1)
Sodium: 139 mmol/L (ref 135–145)

## 2024-06-17 LAB — CBC
HCT: 33.2 % — ABNORMAL LOW (ref 39.0–52.0)
Hemoglobin: 11 g/dL — ABNORMAL LOW (ref 13.0–17.0)
MCH: 29 pg (ref 26.0–34.0)
MCHC: 33.1 g/dL (ref 30.0–36.0)
MCV: 87.6 fL (ref 80.0–100.0)
Platelets: 566 10*3/uL — ABNORMAL HIGH (ref 150–400)
RBC: 3.79 MIL/uL — ABNORMAL LOW (ref 4.22–5.81)
RDW: 13.7 % (ref 11.5–15.5)
WBC: 11.2 10*3/uL — ABNORMAL HIGH (ref 4.0–10.5)
nRBC: 0 % (ref 0.0–0.2)

## 2024-06-17 LAB — GLUCOSE, CAPILLARY
Glucose-Capillary: 177 mg/dL — ABNORMAL HIGH (ref 70–99)
Glucose-Capillary: 155 mg/dL — ABNORMAL HIGH (ref 70–99)

## 2024-06-17 LAB — SURGICAL PATHOLOGY

## 2024-06-17 MED ORDER — AMOXICILLIN-POT CLAVULANATE 875-125 MG PO TABS: 1.0000 | ORAL_TABLET | Freq: Two times a day (BID) | ORAL | 0 refills | Status: AC

## 2024-06-17 MED ORDER — POLYETHYLENE GLYCOL 3350 17 G PO PACK: 17.0000 g | PACK | Freq: Every day | ORAL | 0 refills | Status: AC | PRN

## 2024-06-17 NOTE — Progress Notes (Signed)
  Subjective:  Patient ID: Calvin Shields, Calvin Shields    DOB: 03/08/1956,  MRN: [id number]  Chief Complaint  Patient presents with   Leg Infection    DOS: 06/16/24 Procedure: 1.  Amputation right great toe at proximal phalanx level, right foot   68 y.o. Calvin Shields seen for post op check. He reports doing well denies pain. Dicussed findings from surgery and plan for follow up next week. He is hopeful for discharge today. Has post op shoe in room.   Review of Systems: Negative except as noted in the HPI. Denies N/V/F/Ch.   Objective:   Constitutional Well developed. Well nourished.  Vascular Foot warm and well perfused. Capillary refill normal to all digits.   No calf pain with palpation  Neurologic Normal speech. Oriented to person, place, and time. Epicritic sensation absent to forefoot  Dermatologic Dressing C/D/I to right foot  Orthopedic: S/p R hallux amputation to mid proximal phalanx level   Radiographs: Interval amputation of the great toe to the level of the mid shaft of the proximal phalanx. Expected postsurgical appearance.  Pathology: A. TOE, RIGHT GREAT, AMPUTATION:  -  Skin and subcutaneous soft tissue with ulceration and underlying abscess formation.  -  Underlying bone with focal acute osteomyelitis, bony remodeling and serous atrophy.   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  POD # 1 s/p R hallux amputation -Progressing as expected post op, pain controlled no bleeding. Clean margin expected at amp site.  -XR: expected post op changes -WB Status: WBAT in post op shoe -Sutures: remain intact 2-3 weeks. -Medications/ABX: Recommend augmentin  x 4 days upon DC -Dressing: Remain C/D/I until follow up - F/u Plan: Follow up next week Thursday with me, office to call to arrange, will sign off        Maridee Shoemaker,  DPM [facility] / [facility]

## 2024-06-17 NOTE — Discharge Summary (Signed)
 [facility]  Physician Discharge Summary   Patient ID: Calvin Shields MRN: [id number] DOB/AGE: 05/29/1956 68 y.o.  Admit date: 06/14/2024 Discharge date:   06/17/2024   PCP: Leesa Pulling, MD  DISCHARGE DIAGNOSES:    Sepsis due to cellulitis [facility])   Type 2 diabetes mellitus (HCC)   HTN (hypertension)   Hypokalemia   Diabetic infection of right foot (HCC)   CKD stage 3a, GFR 45-59 ml/min (HCC)   Osteomyelitis of great toe of right foot (HCC)   RECOMMENDATIONS FOR OUTPATIENT FOLLOW UP: Please follow instructions provided by podiatry   Home Health: None Equipment/Devices: None  CODE STATUS: Full code  DISCHARGE CONDITION: fair  Diet recommendation: As before  INITIAL HISTORY: 68 year old M with PMH of DM-2, CKD-3A, HTN, HLD and right great toe ulcer followed by podiatry sent to ED by his podiatrist for right great toe ulcer evaluation after he presented with progressive swelling, redness, drainage and odor from right great toe ulcer for about 7 days. In ED, febrile, tachycardic with leukocytosis meeting sepsis criteria. Lactic acid within normal. MRI right foot suggested cellulitis and osteomyelitis.   Consultations: Podiatry   Procedures: Amputation of the right great toe  HOSPITAL COURSE:   Right first toe infection/osteomyelitis/sepsis, present on admission  Had fever, leukocytosis and tachycardia on presentation.  No signs of endorgan damage.  CRP elevated to 25.  MRI showing cellulitis/osteomyelitis of right great toe.   Pulses were difficult to palpate so patient underwent ABIs which were unremarkable. Patient underwent amputation of the right first toe.  Margins were clear.  Augmentin  for 5 days.  Pain is reasonably well-controlled.  Dressing changes/wound care instruction as per podiatry.  Follow-up as per podiatry.   NIDDM-2 with CKD-3A, diabetic foot ulcer, HLD, neuropathy and hyperglycemia A1c 6.2%.  On Trulicity  at home.   CKD-3A Stable.      Essential hypertension Resume home medications   Hypokalemia Supplemented   GERD - Continue home Pepcid   Class I obesity  Patient is stable.  Okay for discharge home once cleared by podiatry.  PERTINENT LABS:  The results of significant diagnostics from this hospitalization (including imaging, microbiology, ancillary and laboratory) are listed below for reference.    Microbiology: Recent Results (from the past 240 hours)  Blood Cultures x 2 sites     Status: None (Preliminary result)   Collection Time: 06/14/24  4:45 PM   Specimen: BLOOD  Result Value Ref Range Status   Specimen Description BLOOD RIGHT ANTECUBITAL  Final   Special Requests   Final    BOTTLES DRAWN AEROBIC AND ANAEROBIC Blood Culture results may not be optimal due to an inadequate volume of blood received in culture bottles   Culture   Final    NO GROWTH 3 DAYS Performed at [facility], [address], [place], [place] [address]    Report Status PENDING  Incomplete  Blood Cultures x 2 sites     Status: None (Preliminary result)   Collection Time: 06/14/24  4:51 PM   Specimen: BLOOD LEFT HAND  Result Value Ref Range Status   Specimen Description BLOOD LEFT HAND  Final   Special Requests   Final    BOTTLES DRAWN AEROBIC AND ANAEROBIC Blood Culture results may not be optimal due to an inadequate volume of blood received in culture bottles   Culture   Final    NO GROWTH 3 DAYS Performed at [facility], [address], [place], [place] [address]  Report Status PENDING  Incomplete  Surgical pcr screen     Status: None   Collection Time: 06/16/24  3:30 AM   Specimen: Nasal Mucosa; Nasal Swab  Result Value Ref Range Status   MRSA, PCR NEGATIVE NEGATIVE Final   Staphylococcus aureus NEGATIVE NEGATIVE Final    Comment: (NOTE) The Xpert SA Assay (FDA approved for NASAL specimens in patients 56 years of age and older), is one component of a comprehensive surveillance program. It is not  intended to diagnose infection nor to guide or monitor treatment. Performed at [facility], [address], [place], [place] [address]   Aerobic/Anaerobic Culture w Gram Stain (surgical/deep wound)     Status: None (Preliminary result)   Collection Time: 06/16/24 10:20 AM   Specimen: Foot, Right; Tissue  Result Value Ref Range Status   Specimen Description TISSUE  Final   Special Requests NONE  Final   Gram Stain   Final    NO WBC SEEN FEW GRAM POSITIVE COCCI IN PAIRS Performed at [facility], [address], [place], [place] [address]    Culture PENDING  Incomplete   Report Status PENDING  Incomplete     Labs:   Basic Metabolic Panel: Recent Labs  Lab 06/14/24 1645 06/15/24 0635 06/16/24 0829 06/17/24 0614  NA 136 135 137 139  K 3.3* 3.3* 3.9 3.6  CL 93* 97* 100 103  CO2 28 25 25 24   GLUCOSE 127* 128* 135* 109*  BUN 14 12 12 14   CREATININE 1.55* 1.20 1.14 1.12  CALCIUM  9.7 9.0 9.3 9.3  MG  --  1.7  --   --    Liver Function Tests: Recent Labs  Lab 06/14/24 1645  AST 23  ALT 25  ALKPHOS 119  BILITOT 1.3*  PROT 8.1  ALBUMIN  3.5    CBC: Recent Labs  Lab 06/14/24 1645 06/15/24 0635 06/16/24 0829 06/17/24 0614  WBC 13.8* 12.8* 11.9* 11.2*  NEUTROABS 10.6*  --   --   --   HGB 11.6* 10.7* 11.5* 11.0*  HCT 35.8* 32.2* 35.7* 33.2*  MCV 89.1 87.0 88.1 87.6  PLT 461* 467* 487* 566*    CBG: Recent Labs  Lab 06/16/24 1039 06/16/24 1157 06/16/24 1709 06/16/24 2030 06/17/24 0807  GLUCAP 115* 110* 121* 153* 177*     IMAGING STUDIES DG Foot Complete Right Result Date: 06/16/2024 Please see detailed radiograph report in office note.  DG Foot 2 Views Right Result Date: 06/16/2024 CLINICAL DATA:  Right great toe amputation EXAM: RIGHT FOOT - 2 VIEW COMPARISON:  Right foot radiographs 06/14/2024 and MRI right forefoot 06/14/2024 FINDINGS: Interval transverse amputation of the great toe to the level of the mid shaft of the proximal phalanx.  Sharp amputation margin. Plantar calcaneal heel spur. Lateral plate and screw infant arthrodesis of the calcaneocuboid joint. There is again a fracture of the lateral aspect of each screw, unchanged from 12/25/2020. Postsurgical changes of tibiotalar arthrodesis. IMPRESSION: Interval amputation of the great toe to the level of the mid shaft of the proximal phalanx. Expected postsurgical appearance. Electronically Signed   By: Bertina Broccoli M.D.   On: 06/16/2024 14:31   VAS US  ABI WITH/WO TBI Result Date: 06/15/2024  LOWER EXTREMITY DOPPLER STUDY Patient Name:  Calvin Shields  Date of Exam:   06/15/2024 Medical Rec #: [id number]         Accession #:    [id number] Date of Birth: January 29, 1956  Patient Gender: M Patient Age:   75 years Exam Location:  [facility] Procedure:      VAS US  ABI WITH/WO TBI Referring Phys: TIMOTHY OPYD --------------------------------------------------------------------------------  Indications: Ulceration. High Risk Factors: Hypertension, hyperlipidemia, Diabetes, no history of                    smoking. Other Factors: CKD, s/p great tow amputation.  Comparison Study: No previous exams Performing Technologist: Hill, Jody RVT, RDMS  Examination Guidelines: A complete evaluation includes at minimum, Doppler waveform signals and systolic blood pressure reading at the level of bilateral brachial, anterior tibial, and posterior tibial arteries, when vessel segments are accessible. Bilateral testing is considered an integral part of a complete examination. Photoelectric Plethysmograph (PPG) waveforms and toe systolic pressure readings are included as required and additional duplex testing as needed. Limited examinations for reoccurring indications may be performed as noted.  ABI Findings: +---------+------------------+-----+---------+----------+ Right    Rt Pressure (mmHg)IndexWaveform Comment    +---------+------------------+-----+---------+----------+ Brachial 171                     triphasic           +---------+------------------+-----+---------+----------+ PTA      179               1.05 triphasic           +---------+------------------+-----+---------+----------+ DP       170               0.99 triphasic           +---------+------------------+-----+---------+----------+ Great Toe                       Abnormal open wound +---------+------------------+-----+---------+----------+ +---------+------------------+-----+---------+----------+ Left     Lt Pressure (mmHg)IndexWaveform Comment    +---------+------------------+-----+---------+----------+ Brachial 168                    triphasic           +---------+------------------+-----+---------+----------+ PTA      175               1.02 triphasic           +---------+------------------+-----+---------+----------+ DP       151               0.88 biphasic            +---------+------------------+-----+---------+----------+ Great Toe                                amputation +---------+------------------+-----+---------+----------+  Summary: Right: Resting right ankle-brachial index is within normal range. Unable to obtain TBI due to open wound. Left: Resting left ankle-brachial index is within normal range. Great toe amputation *See table(s) above for measurements and observations.  Electronically signed by Runell Countryman on 06/15/2024 at 4:31:45 PM.    Final    MR FOOT RIGHT WO CONTRAST Result Date: 06/14/2024 EXAM DESCRIPTION: MR FOOT RIGHT WO CONTRAST CLINICAL HISTORY: Osteonecrosis suspected, foot, xray done; Soft tissue infection suspected, foot, xray done COMPARISON: None Available. TECHNIQUE: MRI of the foot is performed according to our usual protocol with multiplanar multi sequence imaging. FINDINGS: There is a soft tissue wound/ulceration about the tuft of the first digit. Moderate phlegmonous changes in the surrounding soft tissues. Moderate to severe marrow edema.  Cortical erosion/destruction to the distal tuft. Findings consistent with osteomyelitis. No drainable  collection. The marrow signal is otherwise unremarkable. Moderate to severe dorsal subcutaneous edema suggesting cellulitis. Moderate to severe atrophy of the musculature. The tendons are unremarkable. No Morton's neuroma. IMPRESSION: Soft tissue wound/ulceration about the tuft of the first digit with moderate phlegmonous changes. No drainable collection. Moderate to severe marrow edema to the tuft as well as cortical destruction distally consistent with osteomyelitis. Moderate to severe dorsal subcutaneous edema suggesting cellulitis. Moderate to severe muscle atrophy. Electronically signed by: Basilio Both MD 06/14/2024 10:33 PM EDT RP Workstation: [id number]    DISCHARGE EXAMINATION: Vitals:   06/16/24 2031 06/17/24 0011 06/17/24 0504 06/17/24 0811  BP: (!) 160/76 136/83 134/72 (!) 124/59  Pulse: 93 95 86 96  Resp:    17  Temp: 99.2 F (37.3 C) 98.7 F (37.1 C) 98.6 F (37 C) 98.7 F (37.1 C)  TempSrc: Oral Oral Oral Oral  SpO2: 99% 97% 95% 95%  Weight:      Height:       General appearance: Awake alert.  In no distress Resp: Clear to auscultation bilaterally.  Normal effort Cardio: S1-S2 is normal regular.  No S3-S4.  No rubs murmurs or bruit GI: Abdomen is soft.  Nontender nondistended.  Bowel sounds are present normal.  No masses organomegaly   DISPOSITION: Home  Discharge Instructions     Call MD for:  difficulty breathing, headache or visual disturbances   Complete by: As directed    Call MD for:  extreme fatigue   Complete by: As directed    Call MD for:  persistant dizziness or light-headedness   Complete by: As directed    Call MD for:  persistant nausea and vomiting   Complete by: As directed    Call MD for:  severe uncontrolled pain   Complete by: As directed    Call MD for:  temperature >100.4   Complete by: As directed    Diet - low sodium heart healthy    Complete by: As directed    Discharge instructions   Complete by: As directed    Please follow instructions provided by podiatry.  You were cared for by a hospitalist during your hospital stay. If you have any questions about your discharge medications or the care you received while you were in the hospital after you are discharged, you can call the unit and asked to speak with the hospitalist on call if the hospitalist that took care of you is not available. Once you are discharged, your primary care physician will handle any further medical issues. Please note that NO REFILLS for any discharge medications will be authorized once you are discharged, as it is imperative that you return to your primary care physician (or establish a relationship with a primary care physician if you do not have one) for your aftercare needs so that they can reassess your need for medications and monitor your lab values. If you do not have a primary care physician, you can call [phone] for a physician referral.   Discharge wound care:   Complete by: As directed    Wound care instructions as per podiatry.   Increase activity slowly   Complete by: As directed          Allergies as of 06/17/2024       Reactions   Codeine Nausea And Vomiting   Fish Allergy    Flares gout   Aspirin  Other (See Comments)   Abdominal Pain   Ibuprofen Other (See Comments)   Abdominal Pain, tolerates  taking with food         Medication List     TAKE these medications    acetaminophen  500 MG tablet Commonly known as: TYLENOL  Take 1,000 mg by mouth every 6 (six) hours as needed for moderate pain (pain score 4-6).   allopurinol  100 MG tablet Commonly known as: ZYLOPRIM  Take 300 mg by mouth daily.   amoxicillin -clavulanate 875-125 MG tablet Commonly known as: AUGMENTIN  Take 1 tablet by mouth every 12 (twelve) hours for 4 days.   atorvastatin  40 MG tablet Commonly known as: LIPITOR Take 40 mg by mouth at bedtime.    colchicine 0.6 MG tablet Take 0.6 mg by mouth daily as needed (gout).   famotidine 20 MG tablet Commonly known as: PEPCID Take 20 mg by mouth 2 (two) times daily as needed for heartburn or indigestion.   gabapentin 300 MG capsule Commonly known as: NEURONTIN Take 300 mg by mouth 2 (two) times daily.   ibuprofen 200 MG tablet Commonly known as: ADVIL Take 400 mg by mouth every 6 (six) hours as needed for moderate pain (pain score 4-6).   lisinopril -hydrochlorothiazide  20-25 MG tablet Commonly known as: ZESTORETIC  Take 1 tablet by mouth daily.   multivitamin with minerals Tabs tablet Take 1 tablet by mouth daily.   ondansetron  4 MG disintegrating tablet Commonly known as: ZOFRAN -ODT Take 4 mg by mouth every 6 (six) hours as needed for vomiting or nausea.   oxyCODONE  5 MG immediate release tablet Commonly known as: Oxy IR/ROXICODONE  Take 5 mg by mouth daily as needed for moderate pain (pain score 4-6).   polyethylene glycol 17 g packet Commonly known as: MIRALAX / GLYCOLAX Take 17 g by mouth daily as needed for mild constipation.   traMADol 50 MG tablet Commonly known as: ULTRAM Take 50 mg by mouth daily as needed for moderate pain (pain score 4-6).   Trulicity  0.75 MG/0.5ML Soaj Generic drug: Dulaglutide  Inject 0.75 mg into the skin every Tuesday.               Discharge Care Instructions  (From admission, onward)           Start     Ordered   06/17/24 0000  Discharge wound care:       Comments: Wound care instructions as per podiatry.   06/17/24 1015              Follow-up Information     Standiford, Karlene Overcast, DPM. Schedule an appointment as soon as possible for a visit in 1 week(s).   Specialty: Podiatry Why: post hospitalization follow up Contact information: [address] [place] [address] [phone]                 TOTAL DISCHARGE TIME: 35 minutes  Annslee Tercero Lyndon Santiago  [facility] Pager  on [email or web address]  06/17/2024, 10:15 AM

## 2024-06-17 NOTE — Plan of Care (Signed)
  Problem: Education: Goal: Ability to describe self-care measures that may prevent or decrease complications (Diabetes Survival Skills Education) will improve Outcome: Progressing   Problem: Skin Integrity: Goal: Risk for impaired skin integrity will decrease Outcome: Progressing   Problem: Tissue Perfusion: Goal: Adequacy of tissue perfusion will improve Outcome: Progressing   Problem: Activity: Goal: Risk for activity intolerance will decrease Outcome: Progressing   Problem: Nutrition: Goal: Adequate nutrition will be maintained Outcome: Progressing   Problem: Pain Managment: Goal: General experience of comfort will improve and/or be controlled Outcome: Progressing   Problem: Safety: Goal: Ability to remain free from injury will improve Outcome: Progressing

## 2024-06-17 NOTE — Progress Notes (Signed)
 OT Cancellation Note  Patient Details Name: Calvin Shields MRN: [id number] DOB: [birth year]   Cancelled Treatment:    Reason Eval/Treat Not Completed: OT screened, no needs identified, will sign off (Per PT, no OT needs.)  Starla Easterly 06/17/2024, 6:27 AM

## 2024-06-17 NOTE — Care Management Important Message (Signed)
 Important Message  Patient Details  Name: Calvin Shields MRN: [id number] Date of Birth: 26-Sep-1956   Important Message Given:  Yes - Medicare IM     Felix Host 06/17/2024, 3:20 PM

## 2024-06-17 NOTE — Progress Notes (Signed)
 Mobility Specialist Progress Note:    06/17/24 1205  Mobility  Activity Ambulated with assistance in hallway  Level of Assistance Contact guard assist, steadying assist  Assistive Device Front wheel walker  Distance Ambulated (ft) 400 ft  Activity Response Tolerated well  Mobility Referral Yes  Mobility visit 1 Mobility  Mobility Specialist Start Time (ACUTE ONLY) 1141  Mobility Specialist Stop Time (ACUTE ONLY) 1154  Mobility Specialist Time Calculation (min) (ACUTE ONLY) 13 min   Pt received in bed, agreeable to mobility. No physical assistance needed. C/o tingling in foot. Returned to room w/o fault. Left pt in chair. Personal belongings and call light within reach. All needs met.   Doak Free Mobility Specialist  Please contact via [employer] or  Rehab Office [phone]

## 2024-06-17 NOTE — Plan of Care (Signed)

## 2024-06-17 NOTE — Progress Notes (Signed)
 Pt called me to the room because he had vomited and wanted something for nausea. This was the first time he had an episode like this during this admission. I gave him medication and it offered relief. See MAR.

## 2024-06-18 ENCOUNTER — Telehealth: Payer: Self-pay | Admitting: Podiatry

## 2024-06-18 DIAGNOSIS — Z0271 Encounter for disability determination: Secondary | ICD-10-CM

## 2024-06-18 NOTE — Telephone Encounter (Signed)
 Recd forms from [employer]. Faxed notes and forms to [phone] for pt. I will let him know the forms are ready for pick up on Monday 06/22/23. He did adv it should be 3 forms. I filled out two from [facility]).

## 2024-06-19 LAB — CULTURE, BLOOD (ROUTINE X 2)
Culture: NO GROWTH
Culture: NO GROWTH

## 2024-06-21 ENCOUNTER — Telehealth: Payer: Self-pay | Admitting: Podiatry

## 2024-06-21 LAB — AEROBIC/ANAEROBIC CULTURE W GRAM STAIN (SURGICAL/DEEP WOUND): Gram Stain: NONE SEEN

## 2024-06-21 NOTE — Telephone Encounter (Signed)
 pat lft mess about forms. I called him and adv I am waiting on the 3rd form. I recd 2 and faxed those and once I get the last one will call him to pick up his copies from office.

## 2024-06-24 ENCOUNTER — Telehealth: Payer: Self-pay | Admitting: Podiatry

## 2024-06-24 ENCOUNTER — Ambulatory Visit (INDEPENDENT_AMBULATORY_CARE_PROVIDER_SITE_OTHER): Admitting: Podiatry

## 2024-06-24 DIAGNOSIS — E08621 Diabetes mellitus due to underlying condition with foot ulcer: Secondary | ICD-10-CM | POA: Diagnosis not present

## 2024-06-24 DIAGNOSIS — L97514 Non-pressure chronic ulcer of other part of right foot with necrosis of bone: Secondary | ICD-10-CM

## 2024-06-24 DIAGNOSIS — Z9889 Other specified postprocedural states: Secondary | ICD-10-CM

## 2024-06-24 NOTE — Telephone Encounter (Signed)
 pt lft mess on my vmail. I called back. He said there is not another form coming. I adv of faxed on 06/18/24 and he will get his copy at 07/08/24 visit.

## 2024-06-24 NOTE — Progress Notes (Signed)
  Subjective:  Patient ID: Calvin Shields, male    DOB: [birth year],  MRN: [id number]  Chief Complaint  Patient presents with   Post-op Follow-up    Denies pain. Finished Augmentin  two days ago. Would like to know if he needs another round. No drainage. Surgical site intact as well as sutures. Denies fever/body aches/chills. WB in surgical shoe.     DOS: 06/16/24 Procedure: 1.  Amputation right great toe at proximal phalanx level, right foot   68 y.o. male seen for post op check.  Patient presenting for office visit proximately 1 week status post above procedure.  Has been at home walking in postop shoe has left dressing clean dry and intact instructed.  Review of Systems: Negative except as noted in the HPI. Denies N/V/F/Ch.   Objective:   Constitutional Well developed. Well nourished.  Vascular Foot warm and well perfused. Capillary refill normal to all digits.   No calf pain with palpation  Neurologic Normal speech. Oriented to person, place, and time. Epicritic sensation absent to forefoot  Dermatologic Right hallux amputation site healing well no erythema or drainage well coapted   Orthopedic: S/p R hallux amputation to mid proximal phalanx level   Radiographs: Interval amputation of the great toe to the level of the mid shaft of the proximal phalanx. Expected postsurgical appearance.  Pathology: A. TOE, RIGHT GREAT, AMPUTATION:  -  Skin and subcutaneous soft tissue with ulceration and underlying abscess formation.  -  Underlying bone with focal acute osteomyelitis, bony remodeling and serous atrophy.   Micro: ABUNDANT STREPTOCOCCUS MITIS/ORALIS  RARE ENTEROBACTER CLOACAE   Assessment:   Osteomyelitis right hallux s/p hallux amputation to mid proximal phalanx level  Plan:  Patient was evaluated and treated and all questions answered.  1 week  s/p R hallux amputation -Progressing as expected post op,  amp site healing well no evidence necrosis dehiscence or  infection -XR: expected post op changes -WB Status: WBAT in post op shoe -Sutures: remain intact 1-2 more weeks -Medications/ABX: No further antibiotics indicated s/p augmentin  post op -Dressing: Remain C/D/I until follow up - F/u Plan: Follow up on July 8 for suture removal        Calvin Shields, DPM [facility] / [facility]

## 2024-06-28 DIAGNOSIS — E1142 Type 2 diabetes mellitus with diabetic polyneuropathy: Secondary | ICD-10-CM | POA: Diagnosis not present

## 2024-06-28 DIAGNOSIS — E782 Mixed hyperlipidemia: Secondary | ICD-10-CM | POA: Diagnosis not present

## 2024-06-28 DIAGNOSIS — M109 Gout, unspecified: Secondary | ICD-10-CM | POA: Diagnosis not present

## 2024-07-08 ENCOUNTER — Encounter: Payer: Self-pay | Admitting: Podiatry

## 2024-07-08 ENCOUNTER — Ambulatory Visit (INDEPENDENT_AMBULATORY_CARE_PROVIDER_SITE_OTHER): Admitting: Podiatry

## 2024-07-08 DIAGNOSIS — Z9889 Other specified postprocedural states: Secondary | ICD-10-CM

## 2024-07-08 DIAGNOSIS — L97514 Non-pressure chronic ulcer of other part of right foot with necrosis of bone: Secondary | ICD-10-CM | POA: Diagnosis not present

## 2024-07-08 DIAGNOSIS — E08621 Diabetes mellitus due to underlying condition with foot ulcer: Secondary | ICD-10-CM

## 2024-07-08 NOTE — Progress Notes (Signed)
  Subjective:  Patient ID: Calvin Shields, male    DOB: [birth year],  MRN: [id number]  Chief Complaint  Patient presents with   Routine Post Op     Amputation right great toe at proximal phalanx level, right foot. 0 pain. IDDM A1C 6.1. Stitch removal.    DOS: 06/16/24 Procedure: 1.  Amputation right great toe at proximal phalanx level, right foot   68 y.o. male seen for post op check.  Patient now is about 3 weeks out from surgery.  Doing well no concerns walking with postop shoe.  He is hopeful to go back to work on August 4.  Review of Systems: Negative except as noted in the HPI. Denies N/V/F/Ch.   Objective:   Constitutional Well developed. Well nourished.  Vascular Foot warm and well perfused. Capillary refill normal to all digits.   No calf pain with palpation  Neurologic Normal speech. Oriented to person, place, and time. Epicritic sensation absent to forefoot  Dermatologic Right hallux amputation site well-healed without dehiscence or necrosis no evidence of residual infection.  No open wound    Orthopedic: S/p R hallux amputation to mid proximal phalanx level   Radiographs: Interval amputation of the great toe to the level of the mid shaft of the proximal phalanx. Expected postsurgical appearance.  Pathology: A. TOE, RIGHT GREAT, AMPUTATION:  -  Skin and subcutaneous soft tissue with ulceration and underlying abscess formation.  -  Underlying bone with focal acute osteomyelitis, bony remodeling and serous atrophy.   Micro: ABUNDANT STREPTOCOCCUS MITIS/ORALIS  RARE ENTEROBACTER CLOACAE   Assessment:   Osteomyelitis right hallux s/p hallux amputation to mid proximal phalanx level  Plan:  Patient was evaluated and treated and all questions answered.  3 week  s/p R hallux amputation -Progressing as expected post op, amputation site is now fully healed - No further dressings required debrided some eschar/hyperkeratotic tissue around the amputation site margins  today with no issues -Okay to go back to work August 4 with no restrictions -XR: expected post op changes -WB Status: WBAT in post op shoe okay to transition back to regular shoe -Sutures: Removed in total at this visit -Medications/ABX: No further antibiotics indicated s/p augmentin  post op -Dressing: Okay to wash foot and apply Band-Aid style dressing as needed - F/u Plan: Follow up with Dr. Joya next month has appointment already.  He is interested in getting his hammertoes corrected on the right foot.        Marolyn JULIANNA Honour, DPM [facility] / [facility]

## 2024-07-12 DIAGNOSIS — Z96652 Presence of left artificial knee joint: Secondary | ICD-10-CM | POA: Diagnosis not present

## 2024-07-17 DIAGNOSIS — N1831 Chronic kidney disease, stage 3a: Secondary | ICD-10-CM | POA: Diagnosis not present

## 2024-07-17 DIAGNOSIS — E782 Mixed hyperlipidemia: Secondary | ICD-10-CM | POA: Diagnosis not present

## 2024-07-17 DIAGNOSIS — M109 Gout, unspecified: Secondary | ICD-10-CM | POA: Diagnosis not present

## 2024-07-17 DIAGNOSIS — I10 Essential (primary) hypertension: Secondary | ICD-10-CM | POA: Diagnosis not present

## 2024-07-19 ENCOUNTER — Ambulatory Visit (INDEPENDENT_AMBULATORY_CARE_PROVIDER_SITE_OTHER): Admitting: Podiatry

## 2024-07-19 ENCOUNTER — Encounter: Payer: Self-pay | Admitting: Podiatry

## 2024-07-19 DIAGNOSIS — M2041 Other hammer toe(s) (acquired), right foot: Secondary | ICD-10-CM

## 2024-07-19 DIAGNOSIS — E1142 Type 2 diabetes mellitus with diabetic polyneuropathy: Secondary | ICD-10-CM

## 2024-07-19 DIAGNOSIS — M79675 Pain in left toe(s): Secondary | ICD-10-CM

## 2024-07-19 DIAGNOSIS — B351 Tinea unguium: Secondary | ICD-10-CM | POA: Diagnosis not present

## 2024-07-19 DIAGNOSIS — M79674 Pain in right toe(s): Secondary | ICD-10-CM

## 2024-07-19 NOTE — Progress Notes (Signed)
 Subjective:  Patient ID: Calvin Shields, male    DOB: [birth year],   MRN: [id number]  Chief Complaint  Patient presents with   Nail Problem    She's going to cut my toenails. Saw  Dr. [place] Thapa - saw about a month ago; A1c - 6.2   Hammer Toe    Can she straighten out my toes like she did on my left foot?    68 y.o. male presents for concern of thickened elongated and painful nails that are difficult to trim. Requesting to have them trimmed today. 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 Relates burning and tingling in their feet. Patient is diabetic and last A1c was  Lab Results  Component Value Date   HGBA1C 6.2 (H) 06/15/2024   .    PCP:  Calvin Jerel MATSU, MD    . Denies any other pedal complaints. Denies v/f/c.   Past Medical History:  Diagnosis Date   Diabetes mellitus without complication (HCC)    Hyperlipidemia    Hypertension     Objective:  Physical Exam: Vascular: DP/PT pulses 2/4 bilateral. CFT <3 seconds. Absent hair growth on digits. Edema noted to bilateral lower extremities. Xerosis noted bilaterally.  Skin. No lacerations or abrasions bilateral feet. Nails 2-5 bilateral  are thickened discolored and elongated with subungual debris.   Musculoskeletal: MMT 5/5 bilateral lower extremities in DF, PF, Inversion and Eversion. Deceased ROM in DF of ankle joint. Right hallux partial amputation.  Hammered digits 2-5 on right. Flexible deformity noted on right.  Neurological: Sensation intact to light touch. Protective sensation diminished bilateral.         Assessment:   1. Hammertoe of right foot   2. Controlled type 2 diabetes mellitus with diabetic polyneuropathy, without long-term current use of insulin  (HCC)   3. Pain due to onychomycosis of toenails of both feet         Plan:  Patient was evaluated and treated and all questions answered. -Discussed and educated patient on diabetic foot care, especially with  regards to the vascular,  neurological and musculoskeletal systems.  -Stressed the importance of good glycemic control and the detriment of not  controlling glucose levels in relation to the foot. -Discussed supportive shoes at all times and checking feet regularly.  -Mechanically debrided all nails 2-5 bilateral using sterile nail nipper and filed with dremel without incident  -Patient requesting tenotomy on right second and third digits to prevent ulceration like on left. Procedure below.  -Answered all patient questions -Patient to return  in 3 months for at risk foot care -Patient advised to call the office if any problems or questions arise in the meantime.    Procedure: Flexor Tenotomy Indication for Procedure: toe with semi-reducible hammertoe with distal tip ulceration. Flexor tenotomy indicated to alleviate contracture, reduce pressure, and enhance healing of the ulceration. Location: right, 2nd toe, 3rd toe Anesthesia: Lidocaine  1% plain; 3mL digital block Instrumentation: 18 gauge needle  Technique: The toe was anesthetized as above and prepped in the usual fashion. The toe was exanquinated and a tourniquet was secured at the base of the toe. A 18 gauge needle was then used to make a transverse incision over the plantar aspect of the distal interphalangeal joint. The flexor tendon was incised with noted release of the hammertoe deformity. The incision was then irrigated and dressed with sterile dressing. Patient tolerated the procedure well. Dressing: Dry, sterile, compression dressing. Disposition: Patient tolerated procedure well. Patient to return in 1 week for follow-up.  Return in about 1 week (around 07/26/2024) for post op.   Asberry Failing, DPM

## 2024-07-28 ENCOUNTER — Ambulatory Visit (INDEPENDENT_AMBULATORY_CARE_PROVIDER_SITE_OTHER): Admitting: Podiatry

## 2024-07-28 DIAGNOSIS — E1142 Type 2 diabetes mellitus with diabetic polyneuropathy: Secondary | ICD-10-CM | POA: Diagnosis not present

## 2024-07-28 DIAGNOSIS — M2041 Other hammer toe(s) (acquired), right foot: Secondary | ICD-10-CM | POA: Diagnosis not present

## 2024-07-28 NOTE — Progress Notes (Signed)
  Subjective:  Patient ID: Calvin Shields, male    DOB: 07-May-1956,   MRN: [id number]  No chief complaint on file.   68 y.o. male presents for concern of thickened elongated and painful nails that are difficult to trim. Requesting to have them trimmed today. 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 Relates burning and tingling in their feet. Patient is diabetic and last A1c was  Lab Results  Component Value Date   HGBA1C 6.2 (H) 06/15/2024   .    PCP:  Toribio Jerel MATSU, MD    . Denies any other pedal complaints. Denies v/f/c.   Past Medical History:  Diagnosis Date   Diabetes mellitus without complication (HCC)    Hyperlipidemia    Hypertension     Objective:  Physical Exam: Vascular: DP/PT pulses 2/4 bilateral. CFT <3 seconds. Absent hair growth on digits. Edema noted to bilateral lower extremities. Xerosis noted bilaterally.  Skin. No lacerations or abrasions bilateral feet. Nails 2-5 bilateral  are thickened discolored and elongated with subungual debris.   Musculoskeletal: MMT 5/5 bilateral lower extremities in DF, PF, Inversion and Eversion. Deceased ROM in DF of ankle joint. Right hallux partial amputation. Second digit on right in more rectus position right third digit has healed back in hammered position and still flexible.  Neurological: Sensation intact to light touch. Protective sensation diminished bilateral.         Assessment:   1. Hammertoe of right foot   2. Controlled type 2 diabetes mellitus with diabetic polyneuropathy, without long-term current use of insulin  (HCC)          Plan:  Patient was evaluated and treated and all questions answered. -Discussed and educated patient on diabetic foot care, especially with  regards to the vascular, neurological and musculoskeletal systems.  -Stressed the importance of good glycemic control and the detriment of not  controlling glucose levels in relation to the foot. -Discussed supportive shoes at all times  and checking feet regularly.  -Patient requesting tenotomy on right second digit again as procedure failed. Procedure below.  -Answered all patient questions -Patient to return  in 3 months for at risk foot care -Patient advised to call the office if any problems or questions arise in the meantime.    Procedure: Flexor Tenotomy Indication for Procedure: toe with semi-reducible hammertoe with distal tip ulceration. Flexor tenotomy indicated to alleviate contracture, reduce pressure, and enhance healing of the ulceration. Location: right, , 3rd toe Anesthesia: Lidocaine  1% plain; 3mL digital block Instrumentation: 18 gauge needle  Technique: The toe was anesthetized as above and prepped in the usual fashion. The toe was exanquinated and a tourniquet was secured at the base of the toe. A 18 gauge needle was then used to make a transverse incision over the plantar aspect of the distal interphalangeal joint. The flexor tendon was incised with noted release of the hammertoe deformity. The incision was then irrigated and dressed with sterile dressing. Patient tolerated the procedure well. Dressing: Dry, sterile, compression dressing. Disposition: Patient tolerated procedure well. Patient to return in 1 week for follow-up.   Return in about 1 week (around 08/04/2024).   Asberry Failing, DPM

## 2024-07-29 DIAGNOSIS — M109 Gout, unspecified: Secondary | ICD-10-CM | POA: Diagnosis not present

## 2024-07-29 DIAGNOSIS — E1142 Type 2 diabetes mellitus with diabetic polyneuropathy: Secondary | ICD-10-CM | POA: Diagnosis not present

## 2024-07-29 DIAGNOSIS — E782 Mixed hyperlipidemia: Secondary | ICD-10-CM | POA: Diagnosis not present

## 2024-08-02 ENCOUNTER — Encounter: Payer: Self-pay | Admitting: Podiatry

## 2024-08-02 ENCOUNTER — Ambulatory Visit (INDEPENDENT_AMBULATORY_CARE_PROVIDER_SITE_OTHER): Admitting: Podiatry

## 2024-08-02 VITALS — BP 132/68 | HR 81

## 2024-08-02 DIAGNOSIS — M2041 Other hammer toe(s) (acquired), right foot: Secondary | ICD-10-CM

## 2024-08-02 DIAGNOSIS — E1142 Type 2 diabetes mellitus with diabetic polyneuropathy: Secondary | ICD-10-CM

## 2024-08-02 NOTE — Progress Notes (Signed)
  Subjective:  Patient ID: Calvin Shields, male    DOB: November 28, 1956,   MRN: [id number]  Chief Complaint  Patient presents with   Diabetic Ulcer    68 y.o. male presents for follow-up of tenotomy procedure on right third digit. Patient is diabetic and last A1c was  Lab Results  Component Value Date   HGBA1C 6.2 (H) 06/15/2024   .    PCP:  Toribio Jerel MATSU, MD    . Denies any other pedal complaints. Denies v/f/c.   Past Medical History:  Diagnosis Date   Diabetes mellitus without complication (HCC)    Hyperlipidemia    Hypertension     Objective:  Physical Exam: Vascular: DP/PT pulses 2/4 bilateral. CFT <3 seconds. Absent hair growth on digits. Edema noted to bilateral lower extremities. Xerosis noted bilaterally.  Skin. No lacerations or abrasions bilateral feet. Nails 2-5 bilateral  are thickened discolored and elongated with subungual debris.   Musculoskeletal: MMT 5/5 bilateral lower extremities in DF, PF, Inversion and Eversion. Deceased ROM in DF of ankle joint. Right hallux partial amputation. Second digit on right in more rectus position right third digit rectus position as well.  Neurological: Sensation intact to light touch. Protective sensation diminished bilateral.         Assessment:   1. Hammertoe of right foot   2. Controlled type 2 diabetes mellitus with diabetic polyneuropathy, without long-term current use of insulin  (HCC)          Plan:  Patient was evaluated and treated and all questions answered. -Discussed and educated patient on diabetic foot care, especially with  regards to the vascular, neurological and musculoskeletal systems.  -Stressed the importance of good glycemic control and the detriment of not  controlling glucose levels in relation to the foot. -Discussed supportive shoes at all times and checking feet regularly.  -Right third digit more rectus position and healing well.  -Answered all patient questions -Patient to return  in  3 months for at risk foot care -Patient advised to call the office if any problems or questions arise in the meantime.     Return in about 3 months (around 11/02/2024) for rfc.   Asberry Failing, DPM

## 2024-08-27 DIAGNOSIS — E1142 Type 2 diabetes mellitus with diabetic polyneuropathy: Secondary | ICD-10-CM | POA: Diagnosis not present

## 2024-08-27 DIAGNOSIS — E782 Mixed hyperlipidemia: Secondary | ICD-10-CM | POA: Diagnosis not present

## 2024-08-27 DIAGNOSIS — M109 Gout, unspecified: Secondary | ICD-10-CM | POA: Diagnosis not present

## 2024-09-28 DIAGNOSIS — E782 Mixed hyperlipidemia: Secondary | ICD-10-CM | POA: Diagnosis not present

## 2024-09-28 DIAGNOSIS — E1142 Type 2 diabetes mellitus with diabetic polyneuropathy: Secondary | ICD-10-CM | POA: Diagnosis not present

## 2024-09-28 DIAGNOSIS — M109 Gout, unspecified: Secondary | ICD-10-CM | POA: Diagnosis not present

## 2024-10-08 DIAGNOSIS — R5382 Chronic fatigue, unspecified: Secondary | ICD-10-CM | POA: Diagnosis not present

## 2024-10-08 DIAGNOSIS — I10 Essential (primary) hypertension: Secondary | ICD-10-CM | POA: Diagnosis not present

## 2024-10-08 DIAGNOSIS — E7849 Other hyperlipidemia: Secondary | ICD-10-CM | POA: Diagnosis not present

## 2024-10-08 DIAGNOSIS — N1831 Chronic kidney disease, stage 3a: Secondary | ICD-10-CM | POA: Diagnosis not present

## 2024-10-08 DIAGNOSIS — E1122 Type 2 diabetes mellitus with diabetic chronic kidney disease: Secondary | ICD-10-CM | POA: Diagnosis not present

## 2024-10-08 DIAGNOSIS — Z0001 Encounter for general adult medical examination with abnormal findings: Secondary | ICD-10-CM | POA: Diagnosis not present

## 2024-10-08 DIAGNOSIS — Z1321 Encounter for screening for nutritional disorder: Secondary | ICD-10-CM | POA: Diagnosis not present

## 2024-10-11 ENCOUNTER — Ambulatory Visit: Admitting: Podiatry

## 2024-10-11 DIAGNOSIS — E1142 Type 2 diabetes mellitus with diabetic polyneuropathy: Secondary | ICD-10-CM

## 2024-10-11 DIAGNOSIS — B351 Tinea unguium: Secondary | ICD-10-CM | POA: Diagnosis not present

## 2024-10-11 DIAGNOSIS — M79675 Pain in left toe(s): Secondary | ICD-10-CM | POA: Diagnosis not present

## 2024-10-11 DIAGNOSIS — M79674 Pain in right toe(s): Secondary | ICD-10-CM

## 2024-10-11 NOTE — Progress Notes (Signed)
  Subjective:  Patient ID: Calvin Shields, male    DOB: 16-Feb-1956,   MRN: [id number]  Chief Complaint  Patient presents with   [facility]    [facility] nails thickened.  A1c 6.5 Aug/2025.  No anti coag    68 y.o. male presents for concern of thickened elongated and painful nails that are difficult to trim. Requesting to have them trimmed today. Relates burning and tingling in their feet. Patient is diabetic and last A1c was  Lab Results  Component Value Date   HGBA1C 6.2 (H) 06/15/2024   .   PCP:  Toribio Jerel MATSU, MD    Past Medical History:  Diagnosis Date   Diabetes mellitus without complication (HCC)    Hyperlipidemia    Hypertension     Objective:  Physical Exam: Vascular: DP/PT pulses 2/4 bilateral. CFT <3 seconds. Absent hair growth on digits. Edema noted to bilateral lower extremities. Xerosis noted bilaterally.  Skin. No lacerations or abrasions bilateral feet. Nails 2-5 bilateral  are thickened discolored and elongated with subungual debris.   Musculoskeletal: MMT 5/5 bilateral lower extremities in DF, PF, Inversion and Eversion. Deceased ROM in DF of ankle joint. Right hallux partial amputation. Second digit on right in more rectus position right third digit rectus position as well.  Neurological: Sensation intact to light touch. Protective sensation diminished bilateral.         Assessment:   1. Pain due to onychomycosis of toenails of both feet   2. Controlled type 2 diabetes mellitus with diabetic polyneuropathy, without long-term current use of insulin  (HCC)          Plan:  Patient was evaluated and treated and all questions answered. -Discussed and educated patient on diabetic foot care, especially with  regards to the vascular, neurological and musculoskeletal systems.  -Stressed the importance of good glycemic control and the detriment of not  controlling glucose levels in relation to the foot. -Discussed supportive shoes at all times and checking feet  regularly.  -Mechanically debrided all nails 1-5 bilateral using sterile nail nipper and filed with dremel without incident  -Answered all patient questions -Patient to return  in 3 months for at risk foot care -Patient advised to call the office if any problems or questions arise in the meantime.      No follow-ups on file.   Asberry Failing, DPM

## 2024-10-18 DIAGNOSIS — Z1331 Encounter for screening for depression: Secondary | ICD-10-CM | POA: Diagnosis not present

## 2024-10-18 DIAGNOSIS — Z0001 Encounter for general adult medical examination with abnormal findings: Secondary | ICD-10-CM | POA: Diagnosis not present

## 2024-10-18 DIAGNOSIS — Z1389 Encounter for screening for other disorder: Secondary | ICD-10-CM | POA: Diagnosis not present

## 2024-10-18 DIAGNOSIS — Z23 Encounter for immunization: Secondary | ICD-10-CM | POA: Diagnosis not present

## 2024-10-29 DIAGNOSIS — E1142 Type 2 diabetes mellitus with diabetic polyneuropathy: Secondary | ICD-10-CM | POA: Diagnosis not present

## 2024-10-29 DIAGNOSIS — M109 Gout, unspecified: Secondary | ICD-10-CM | POA: Diagnosis not present

## 2024-10-29 DIAGNOSIS — E782 Mixed hyperlipidemia: Secondary | ICD-10-CM | POA: Diagnosis not present

## 2024-11-08 DIAGNOSIS — Z96653 Presence of artificial knee joint, bilateral: Secondary | ICD-10-CM | POA: Diagnosis not present

## 2024-11-08 DIAGNOSIS — G8929 Other chronic pain: Secondary | ICD-10-CM | POA: Diagnosis not present

## 2024-11-08 DIAGNOSIS — M25561 Pain in right knee: Secondary | ICD-10-CM | POA: Diagnosis not present

## 2024-11-08 DIAGNOSIS — M25562 Pain in left knee: Secondary | ICD-10-CM | POA: Diagnosis not present

## 2024-11-22 DIAGNOSIS — E1165 Type 2 diabetes mellitus with hyperglycemia: Secondary | ICD-10-CM | POA: Diagnosis not present

## 2024-11-23 LAB — COMPREHENSIVE METABOLIC PANEL WITH GFR
ALT: 21 IU/L (ref 0–44)
AST: 21 IU/L (ref 0–40)
Albumin: 4.9 g/dL (ref 3.9–4.9)
Alkaline Phosphatase: 149 IU/L — ABNORMAL HIGH (ref 47–123)
BUN/Creatinine Ratio: 13 (ref 10–24)
BUN: 16 mg/dL (ref 8–27)
Bilirubin Total: 0.7 mg/dL (ref 0.0–1.2)
CO2: 21 mmol/L (ref 20–29)
Calcium: 10.8 mg/dL — ABNORMAL HIGH (ref 8.6–10.2)
Chloride: 96 mmol/L (ref 96–106)
Creatinine, Ser: 1.23 mg/dL (ref 0.76–1.27)
Globulin, Total: 3.4 g/dL (ref 1.5–4.5)
Glucose: 96 mg/dL (ref 70–99)
Potassium: 4 mmol/L (ref 3.5–5.2)
Sodium: 137 mmol/L (ref 134–144)
Total Protein: 8.3 g/dL (ref 6.0–8.5)
eGFR: 64 mL/min/1.73 (ref >59)

## 2024-11-23 LAB — LIPID PANEL
Chol/HDL Ratio: 3.4 ratio (ref 0.0–5.0)
Cholesterol, Total: 176 mg/dL (ref 100–199)
HDL: 52 mg/dL (ref >39)
LDL Chol Calc (NIH): 98 mg/dL (ref 0–99)
Triglycerides: 150 mg/dL — ABNORMAL HIGH (ref 0–149)
VLDL Cholesterol Cal: 26 mg/dL (ref 5–40)

## 2024-11-24 ENCOUNTER — Ambulatory Visit: Admitting: Podiatry

## 2024-11-24 DIAGNOSIS — E1142 Type 2 diabetes mellitus with diabetic polyneuropathy: Secondary | ICD-10-CM | POA: Diagnosis not present

## 2024-11-24 NOTE — Progress Notes (Signed)
  Subjective:  Patient ID: Calvin Shields, male    DOB: 08-Dec-1956,   MRN: [id number]  Chief Complaint  Patient presents with   Foot Orthotics    Pt is here for orthotics it is hard for him to walk missing bilateral great toes.   R heel soreness from callus. Diabetic A1c 6.2.     68 y.o. male presents for  concern of wanting new diabetic shoes.  As well as concern for small lesion on the bottom of his right heel that has been painful.  Patient is diabetic and last A1c was  Lab Results  Component Value Date   HGBA1C 6.2 (H) 06/15/2024   .   PCP:  Toribio Jerel MATSU, MD    Past Medical History:  Diagnosis Date   Diabetes mellitus without complication (HCC)    Hyperlipidemia    Hypertension     Objective:  Physical Exam: Vascular: DP/PT pulses 2/4 bilateral. CFT <3 seconds. Absent hair growth on digits. Edema noted to bilateral lower extremities. Xerosis noted bilaterally.  Skin. No lacerations or abrasions bilateral feet. Nails 2-5 bilateral  are thickened discolored and elongated with subungual debris.   Musculoskeletal: MMT 5/5 bilateral lower extremities in DF, PF, Inversion and Eversion. Deceased ROM in DF of ankle joint. Right hallux partial amputation. Second digit on right in more rectus position right third digit rectus position as well.  Neurological: Sensation intact to light touch. Protective sensation diminished bilateral.         Assessment:   1. Controlled type 2 diabetes mellitus with diabetic polyneuropathy, without long-term current use of insulin  (HCC)           Plan:  Patient was evaluated and treated and all questions answered. -Discussed and educated patient on diabetic foot care, especially with  regards to the vascular, neurological and musculoskeletal systems.  -Stressed the importance of good glycemic control and the detriment of not  controlling glucose levels in relation to the foot. -Discussed supportive shoes at all times and checking  feet regularly.  - Here today discuss diabetic shoes.  Diabetic shoe prescription sent Attorney Hyperkeratotic lesion to plantar right heel debrided as courtesy today. -Patient to return  in 3 months for at risk foot care -Patient advised to call the office if any problems or questions arise in the meantime.      No follow-ups on file.   Asberry Failing, DPM

## 2024-11-26 DIAGNOSIS — M109 Gout, unspecified: Secondary | ICD-10-CM | POA: Diagnosis not present

## 2024-11-26 DIAGNOSIS — E1142 Type 2 diabetes mellitus with diabetic polyneuropathy: Secondary | ICD-10-CM | POA: Diagnosis not present

## 2024-11-26 DIAGNOSIS — E782 Mixed hyperlipidemia: Secondary | ICD-10-CM | POA: Diagnosis not present

## 2024-11-29 ENCOUNTER — Ambulatory Visit: Admitting: "Endocrinology

## 2024-11-29 ENCOUNTER — Ambulatory Visit: Admitting: Podiatry

## 2024-11-29 ENCOUNTER — Encounter: Payer: Self-pay | Admitting: "Endocrinology

## 2024-11-29 ENCOUNTER — Telehealth: Payer: Self-pay

## 2024-11-29 VITALS — BP 130/64 | HR 60 | Ht 69.0 in | Wt 242.2 lb

## 2024-11-29 DIAGNOSIS — Z7985 Long-term (current) use of injectable non-insulin antidiabetic drugs: Secondary | ICD-10-CM

## 2024-11-29 DIAGNOSIS — E782 Mixed hyperlipidemia: Secondary | ICD-10-CM

## 2024-11-29 DIAGNOSIS — Z6839 Body mass index (BMI) 39.0-39.9, adult: Secondary | ICD-10-CM

## 2024-11-29 DIAGNOSIS — I10 Essential (primary) hypertension: Secondary | ICD-10-CM

## 2024-11-29 DIAGNOSIS — E1142 Type 2 diabetes mellitus with diabetic polyneuropathy: Secondary | ICD-10-CM

## 2024-11-29 DIAGNOSIS — E1165 Type 2 diabetes mellitus with hyperglycemia: Secondary | ICD-10-CM

## 2024-11-29 DIAGNOSIS — E66812 Obesity, class 2: Secondary | ICD-10-CM | POA: Diagnosis not present

## 2024-11-29 DIAGNOSIS — E1169 Type 2 diabetes mellitus with other specified complication: Secondary | ICD-10-CM | POA: Diagnosis not present

## 2024-11-29 LAB — POCT GLYCOSYLATED HEMOGLOBIN (HGB A1C): HbA1c, POC (controlled diabetic range): 6.7 % (ref 0.0–7.0)

## 2024-11-29 MED ORDER — TRULICITY 1.5 MG/0.5ML ~~LOC~~ SOAJ: 1.5000 mg | SUBCUTANEOUS | 1 refills | Status: AC

## 2024-11-29 NOTE — Telephone Encounter (Signed)
 Patient called to follow up on his diabetic shoe referral to [facility]. Patient spoke with an associate at [place] and states no referral has been received to date.

## 2024-11-29 NOTE — Progress Notes (Signed)
 11/29/2024   Endocrinology follow-up note   Subjective:    Patient ID: Calvin Shields, male    DOB: 26-Dec-1956, PCP Toribio Jerel MATSU, MD   Past Medical History:  Diagnosis Date   Diabetes mellitus without complication [facility])    Hyperlipidemia    Hypertension    Past Surgical History:  Procedure Laterality Date   AMPUTATION Left 09/08/2017   Procedure: PARTIAL TOE AMPUTATION LEFT HALLUX;  Surgeon: Tobie Buckles, DPM;  Location: [facility] OR[facility];  Service: Podiatry;  Laterality: Left;   AMPUTATION TOE Right 06/16/2024   Procedure: AMPUTATION, TOE;  Surgeon: Malvin Marsa FALCON, DPM;  Location: [facility] OR;  Service: Orthopedics/Podiatry;  Laterality: Right;  Right hallux amputation   ANKLE SURGERY Bilateral    BIOPSY  04/03/2023   Procedure: BIOPSY;  Surgeon: Cindie Carlin POUR, DO;  Location: [facility];  Service: Endoscopy;;   COLONOSCOPY WITH PROPOFOL  N/A 04/03/2023   Procedure: COLONOSCOPY WITH PROPOFOL ;  Surgeon: Cindie Carlin POUR, DO;  Location: [facility];  Service: Endoscopy;  Laterality: N/A;  730am, asa 3   ESOPHAGOGASTRODUODENOSCOPY (EGD) WITH PROPOFOL  N/A 04/03/2023   Procedure: ESOPHAGOGASTRODUODENOSCOPY (EGD) WITH PROPOFOL ;  Surgeon: Cindie Carlin POUR, DO;  Location: [facility];  Service: Endoscopy;  Laterality: N/A;   LUMBAR LAMINECTOMY/DECOMPRESSION MICRODISCECTOMY Left 01/16/2024   Procedure: Microdiscectomy - LUMBAR FIVE-SACRAL ONE-LEFT;  Surgeon: Gillie Duncans, MD;  Location: [facility] OR;  Service: Neurosurgery;  Laterality: Left;  C3   POLYPECTOMY  04/03/2023   Procedure: POLYPECTOMY;  Surgeon: Cindie Carlin POUR, DO;  Location: [facility];  Service: Endoscopy;;   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   right leg surgery       Family History  Problem Relation Age of Onset   Diabetes Mother    Cancer - Colon Neg Hx    Colon polyps Neg Hx     Social History   Socioeconomic History   Marital status: Married    Spouse name: Not on file   Number of  children: Not on file   Years of education: Not on file   Highest education level: Not on file  Occupational History   Not on file  Tobacco Use   Smoking status: Never    Passive exposure: Never   Smokeless tobacco: Never  Vaping Use   Vaping status: Never Used  Substance and Sexual Activity   Alcohol use: No   Drug use: No   Sexual activity: Not on file  Other Topics Concern   Not on file  Social History Narrative   Not on file   Social Drivers of Health   Financial Resource Strain: Low Risk (03/31/2024)   Received from [facility]   Overall Financial Resource Strain (CARDIA)    Difficulty of Paying Living Expenses: Not hard at all  Food Insecurity: No Food Insecurity (06/14/2024)   Hunger Vital Sign    Worried About Running Out of Food in the Last Year: Never true    Ran Out of Food in the Last Year: Never true  Transportation Needs: No Transportation Needs (06/14/2024)   PRAPARE - Administrator, Civil Service (Medical): No    Lack of Transportation (Non-Medical): No  Physical Activity: Insufficiently Active (03/31/2024)   [facility]   Exercise Vital Sign    On average, how many days per week do you engage in moderate to strenuous exercise (like a brisk walk)?: 4 days    On average, how many minutes do you engage  in exercise at this level?: 20 min  Stress: No Stress Concern Present (03/31/2024)   Received from [facility] of Occupational Health - Occupational Stress Questionnaire    Feeling of Stress : Not at all  Social Connections: Moderately Integrated (06/14/2024)   Social Connection and Isolation Panel    Frequency of Communication with Friends and Family: More than three times a week    Frequency of Social Gatherings with Friends and Family: Once a week    Attends Religious Services: More than 4 times per year    Active Member of [employer] or Organizations: No    Attends Banker Meetings: Never     Marital Status: Married   Outpatient Encounter Medications as of 11/29/2024  Medication Sig   Dulaglutide  (TRULICITY ) 1.5 MG/0.5ML SOAJ Inject 1.5 mg into the skin once a week.   acetaminophen  (TYLENOL ) 500 MG tablet Take 1,000 mg by mouth every 6 (six) hours as needed for moderate pain (pain score 4-6).   allopurinol  (ZYLOPRIM ) 100 MG tablet Take 300 mg by mouth daily.   atorvastatin  (LIPITOR) 40 MG tablet Take 40 mg by mouth at bedtime.   colchicine 0.6 MG tablet Take 0.6 mg by mouth daily as needed (gout).   famotidine  (PEPCID ) 20 MG tablet Take 20 mg by mouth 2 (two) times daily as needed for heartburn or indigestion.   gabapentin  (NEURONTIN ) 300 MG capsule Take 300 mg by mouth 2 (two) times daily.   ibuprofen (ADVIL) 200 MG tablet Take 400 mg by mouth every 6 (six) hours as needed for moderate pain (pain score 4-6).   lisinopril -hydrochlorothiazide  (PRINZIDE ,ZESTORETIC ) 20-25 MG per tablet Take 1 tablet by mouth daily.   Multiple Vitamin (MULTIVITAMIN WITH MINERALS) TABS tablet Take 1 tablet by mouth daily.   ondansetron  (ZOFRAN -ODT) 4 MG disintegrating tablet Take 4 mg by mouth every 6 (six) hours as needed for vomiting or nausea.   polyethylene glycol (MIRALAX  / GLYCOLAX ) 17 g packet Take 17 g by mouth daily as needed for mild constipation.   traMADol (ULTRAM) 50 MG tablet Take 50 mg by mouth daily as needed for moderate pain (pain score 4-6).   [DISCONTINUED] Dulaglutide  (TRULICITY ) 0.75 MG/0.5ML SOAJ Inject 0.75 mg into the skin every Tuesday.   No facility-administered encounter medications on file as of 11/29/2024.   ALLERGIES: Allergies  Allergen Reactions   Codeine Nausea And Vomiting   Fish Allergy     Flares gout   Aspirin  Other (See Comments)    Abdominal Pain   Ibuprofen Other (See Comments)    Abdominal Pain, tolerates taking with food    VACCINATION STATUS: Immunization History  Administered Date(s) Administered   Influenza,inj,Quad PF,6+ Mos 10/13/2017, 10/06/2019     Diabetes He presents for his follow-up diabetic visit. He has type 2 diabetes mellitus. His disease course has been worsening. There are no hypoglycemic associated symptoms. Pertinent negatives for hypoglycemia include no confusion, headaches, pallor or seizures. Pertinent negatives for diabetes include no chest pain, no fatigue, no polydipsia, no polyphagia, no polyuria and no weakness. There are no hypoglycemic complications. Symptoms are worsening. Diabetic complications include peripheral neuropathy. Risk factors for coronary artery disease include diabetes mellitus, dyslipidemia, hypertension, male sex, obesity and sedentary lifestyle. He is compliant with treatment most of the time. His weight is increasing steadily. He is following a generally unhealthy diet. When asked about meal planning, he reported none. He has had a previous visit with a dietitian. He never participates in exercise. His  home blood glucose trend is increasing steadily. (He presents with controlled, however slightly above target glycemic profile with point-of-care A1c was 6.7%.  He has no hypoglycemia documented or reported.    ) An ACE inhibitor/angiotensin II receptor blocker is being taken. Eye exam is current.  Hyperlipidemia This is a chronic problem. The current episode started more than 1 year ago. The problem is uncontrolled. Exacerbating diseases include diabetes and obesity. Pertinent negatives include no chest pain, myalgias or shortness of breath. Current antihyperlipidemic treatment includes statins. Compliance problems include adherence to diet.  Risk factors for coronary artery disease include diabetes mellitus, dyslipidemia, hypertension, male sex, obesity and a sedentary lifestyle.  Hypertension This is a chronic problem. The current episode started more than 1 year ago. Pertinent negatives include no chest pain, headaches, neck pain, palpitations or shortness of breath. Risk factors for coronary artery  disease include dyslipidemia, diabetes mellitus, obesity, male gender and sedentary lifestyle. Past treatments include ACE inhibitors.     Review of systems Limited as above.   Objective:    BP 130/64   Pulse 60   Ht 5' 9 (1.753 m)   Wt 242 lb 3.2 oz (109.9 kg)   BMI 35.77 kg/m   Wt Readings from Last 3 Encounters:  11/29/24 242 lb 3.2 oz (109.9 kg)  06/16/24 232 lb (105.2 kg)  05/31/24 235 lb 3.2 oz (106.7 kg)        Recent Results (from the past 2160 hours)  Comprehensive metabolic panel with GFR     Status: Abnormal   Collection Time: 11/22/24  1:14 PM  Result Value Ref Range   Glucose 96 70 - 99 mg/dL   BUN 16 8 - 27 mg/dL   Creatinine, Ser 8.76 0.76 - 1.27 mg/dL   eGFR 64 >40 fO/fpw/8.26   BUN/Creatinine Ratio 13 10 - 24   Sodium 137 134 - 144 mmol/L   Potassium 4.0 3.5 - 5.2 mmol/L   Chloride 96 96 - 106 mmol/L   CO2 21 20 - 29 mmol/L   Calcium  10.8 (H) 8.6 - 10.2 mg/dL   Total Protein 8.3 6.0 - 8.5 g/dL   Albumin  4.9 3.9 - 4.9 g/dL   Globulin, Total 3.4 1.5 - 4.5 g/dL   Bilirubin Total 0.7 0.0 - 1.2 mg/dL   Alkaline Phosphatase 149 (H) 47 - 123 IU/L   AST 21 0 - 40 IU/L   ALT 21 0 - 44 IU/L  Lipid panel     Status: Abnormal   Collection Time: 11/22/24  1:14 PM  Result Value Ref Range   Cholesterol, Total 176 100 - 199 mg/dL   Triglycerides 849 (H) 0 - 149 mg/dL   HDL 52 >60 mg/dL   VLDL Cholesterol Cal 26 5 - 40 mg/dL   LDL Chol Calc (NIH) 98 0 - 99 mg/dL   Chol/HDL Ratio 3.4 0.0 - 5.0 ratio    Comment:                                   T. Chol/HDL Ratio                                             Men  Women  1/2 Avg.Risk  3.4    3.3                                   Avg.Risk  5.0    4.4                                2X Avg.Risk  9.6    7.1                                3X Avg.Risk 23.4   11.0   HgB A1c     Status: None   Collection Time: 11/29/24  2:09 PM  Result Value Ref Range   Hemoglobin A1C     HbA1c POC  (<> result, manual entry)     HbA1c, POC (prediabetic range)     HbA1c, POC (controlled diabetic range) 6.7 0.0 - 7.0 %    Lipid Panel     Component Value Date/Time   CHOL 176 11/22/2024 1314   TRIG 150 (H) 11/22/2024 1314   HDL 52 11/22/2024 1314   CHOLHDL 3.4 11/22/2024 1314   CHOLHDL 4.1 10/06/2018 1310   VLDL 29 04/15/2016 0836   LDLCALC 98 11/22/2024 1314   LDLCALC 114 (H) 10/06/2018 1310    Assessment & Plan:   1. Diabetes mellitus without complication (HCC)  - His diabetes is  complicated by peripheral neuropathy , peripheral arterial disease, Charcot's deformities and patient remains at a high risk for more acute and chronic complications of diabetes which include CAD, CVA, CKD, retinopathy, and neuropathy. These are all discussed in detail with the patient.  He presents with controlled, however slightly above target glycemic profile with point-of-care A1c was 6.7%.  He has no hypoglycemia documented or reported.    - Recent labs reviewed with him, showing normal renal function.  - I have re-counseled the patient on diet management and  and weight loss by adopting a carbohydrate restricted / protein rich  Diet.  He presents with weight gain. - he acknowledges that there is a room for improvement in his food and drink choices. - Suggestion is made for him to avoid simple carbohydrates  from his diet including Cakes, Sweet Desserts, Ice Cream, Soda (diet and regular), Sweet Tea, Candies, Chips, Cookies, Store Bought Juices, Alcohol , Artificial Sweeteners,  Coffee Creamer, and Sugar-free Products, Lemonade. This will help patient to have more stable blood glucose profile and potentially avoid unintended weight gain.   - Patient is advised to stick to a routine mealtimes to eat 3 meals  a day and avoid unnecessary snacks ( to snack only to correct hypoglycemia).  - I have approached patient with the following individualized plan to manage diabetes and patient  agrees.  -He is advised to increase Trulicity  to 1.5 mg subcutaneously weekly.     Side effects and precautions discussed with him.   He is not on  metformin  for now.  He will not need insulin  nor glipizide  at this time.    - Patient specific target  for A1c; LDL, HDL, Triglycerides, and  Waist Circumference were discussed in detail.  2) BP/HTN:  - His blood pressure is controlled to target.  He is advised to take half tablet of lisinopril -HCTZ 20-25 mg for the next 7 days and may resume the full  dose afterwards.   3) Lipids/HPL: His recent lipid panel revealed controlled LDL at 79.  He is advised to continue pravastatin  40 mg p.o. nightly.   Side effects and precautions discussed with him.   4)  Weight/Diet: He is gaining weight.  His BMI is 35.77 kg/m- a candidate for modest weight loss.  CDE consult in progress, exercise, and carbohydrates information provided.  5) Chronic Care/Health Maintenance:  -Patient is on ACEI/ARB and Statin medications and encouraged to continue to follow up with Ophthalmology, Podiatrist at least yearly or according to recommendations, and advised to  stay away from smoking. I have recommended yearly flu vaccine and pneumonia vaccination at least every 5 years; moderate intensity exercise for up to 150 minutes weekly; and  sleep for at least 7 hours a day.  History screening ABI was negative for PAD in March 2022.  His study will be repeated in March 2027, or sooner if needed.   - I advised patient to maintain close follow up with Toribio Jerel MATSU, MD for primary care needs.   I spent  26  minutes in the care of the patient today including review of labs from CMP, Lipids, Thyroid  Function, Hematology (current and previous including abstractions from other facilities); face-to-face time discussing  his blood glucose readings/logs, discussing hypoglycemia and hyperglycemia episodes and symptoms, medications doses, his options of short and long term treatment  based on the latest standards of care / guidelines;  discussion about incorporating lifestyle medicine;  and documenting the encounter. Risk reduction counseling performed per USPSTF guidelines to reduce  obesity and cardiovascular risk factors.     Please refer to Patient Instructions for Blood Glucose Monitoring and Insulin /Medications Dosing Guide  in media tab for additional information. Please  also refer to  Patient Self Inventory in the Media  tab for reviewed elements of pertinent patient history.  [facility] participated in the discussions, expressed understanding, and voiced agreement with the above plans.  All questions were answered to his satisfaction. he is encouraged to contact clinic should he have any questions or concerns prior to his return visit.    Follow up plan: -Return in about 4 months (around 03/30/2025) for Bring Meter/CGM Device/Logs- A1c in Office.  Ranny Earl, MD Phone: [phone]  Fax: [phone]  -  This note was partially dictated with voice recognition software. Similar sounding words can be transcribed inadequately or may not  be corrected upon review.  11/29/2024, 2:16 PM

## 2024-11-29 NOTE — Patient Instructions (Signed)

## 2024-12-02 ENCOUNTER — Telehealth: Payer: Self-pay | Admitting: Lab

## 2024-12-02 NOTE — Telephone Encounter (Signed)
 Patient called stating he has not heard from the shoe referral. Chart notes were relayed, and Tierney's phone number was provided for him to confirm receipt of the referral.

## 2024-12-02 NOTE — Telephone Encounter (Signed)
 Patient calling orthotic order be resent to [facility] no order has been received.

## 2025-01-05 ENCOUNTER — Ambulatory Visit: Admitting: Podiatry

## 2025-01-05 ENCOUNTER — Encounter: Payer: Self-pay | Admitting: Podiatry

## 2025-01-05 DIAGNOSIS — M79674 Pain in right toe(s): Secondary | ICD-10-CM

## 2025-01-05 DIAGNOSIS — M79675 Pain in left toe(s): Secondary | ICD-10-CM | POA: Diagnosis not present

## 2025-01-05 DIAGNOSIS — L84 Corns and callosities: Secondary | ICD-10-CM

## 2025-01-05 DIAGNOSIS — B351 Tinea unguium: Secondary | ICD-10-CM | POA: Diagnosis not present

## 2025-01-05 DIAGNOSIS — E1142 Type 2 diabetes mellitus with diabetic polyneuropathy: Secondary | ICD-10-CM

## 2025-01-05 NOTE — Progress Notes (Signed)
"  °  Subjective:  Patient ID: Calvin Shields, male    DOB: Feb 22, 1956,   MRN: [id number]  Chief Complaint  Patient presents with   Diabetes    I'm supposed to get my nails clipped.  My right heel is giving me a lot of problems.  Saw Dr. Lenis - 11/29/2024; A1c - 6.5    69 y.o. male presents for painful dystrophic nails and diabetic foot check.  Requesting to have nails trimmed.  Bottom of his right heel that has been painful.  Patient is diabetic and last A1c was  Lab Results  Component Value Date   HGBA1C 6.7 11/29/2024   .   PCP:  Toribio Jerel MATSU, MD    Past Medical History:  Diagnosis Date   Diabetes mellitus without complication (HCC)    Hyperlipidemia    Hypertension     Objective:  Physical Exam: Vascular: DP/PT pulses 2/4 bilateral. CFT <3 seconds. Absent hair growth on digits. Edema noted to bilateral lower extremities. Xerosis noted bilaterally.  Skin. No lacerations or abrasions bilateral feet. Nails 2-5 bilateral  are thickened discolored and elongated with subungual debris.  Hyperkeratotic lesion noted plantar right heel. Musculoskeletal: MMT 5/5 bilateral lower extremities in DF, PF, Inversion and Eversion. Deceased ROM in DF of ankle joint. Right hallux partial amputation. Second digit on right in more rectus position right third digit rectus position as well.  Neurological: Sensation intact to light touch. Protective sensation diminished bilateral.         Assessment:   1. Pain due to onychomycosis of toenails of both feet   2. Controlled type 2 diabetes mellitus with diabetic polyneuropathy, without long-term current use of insulin  (HCC)   3. Pre-ulcerative corn or callous           Plan:  Patient was evaluated and treated and all questions answered. -Discussed and educated patient on diabetic foot care, especially with  regards to the vascular, neurological and musculoskeletal systems.  -Stressed the importance of good glycemic control and the  detriment of not  controlling glucose levels in relation to the foot. -Discussed supportive shoes at all times and checking feet regularly.  -Mechanically debrided all nails 1-5 bilateral using sterile nail nipper and filed with dremel without incident  -Hyperkeratotic lesion noted to right heel debrided as courtesy.  Discussed prevention techniques to keep this callus down -Answered all patient questions -Patient to return  in 3 months for at risk foot care -Patient advised to call the office if any problems or questions arise in the meantime.       Return in about 3 months (around 04/05/2025) for rfc.   Asberry Failing, DPM    "

## 2025-01-10 ENCOUNTER — Ambulatory Visit: Admitting: Podiatry

## 2025-04-04 ENCOUNTER — Ambulatory Visit: Admitting: "Endocrinology

## 2025-04-11 ENCOUNTER — Ambulatory Visit: Admitting: Podiatry
